# Patient Record
Sex: Male | Born: 1960 | Race: White | Hispanic: No | Smoking: Former smoker
Health system: Southern US, Community
[De-identification: ages and names within clinical notes are randomized; demographics above are authoritative.]

## PROBLEM LIST (undated history)

## (undated) DIAGNOSIS — F419 Anxiety disorder, unspecified: Secondary | ICD-10-CM

## (undated) DIAGNOSIS — I10 Essential (primary) hypertension: Secondary | ICD-10-CM

## (undated) DIAGNOSIS — E785 Hyperlipidemia, unspecified: Secondary | ICD-10-CM

## (undated) DIAGNOSIS — E119 Type 2 diabetes mellitus without complications: Secondary | ICD-10-CM

## (undated) DIAGNOSIS — J439 Emphysema, unspecified: Secondary | ICD-10-CM

## (undated) DIAGNOSIS — F32A Depression, unspecified: Secondary | ICD-10-CM

## (undated) DIAGNOSIS — J45909 Unspecified asthma, uncomplicated: Secondary | ICD-10-CM

## (undated) HISTORY — DX: Hyperlipidemia, unspecified: E78.5

## (undated) HISTORY — DX: Type 2 diabetes mellitus without complications: E11.9

## (undated) HISTORY — DX: Anxiety disorder, unspecified: F41.9

## (undated) HISTORY — DX: Unspecified asthma, uncomplicated: J45.909

## (undated) HISTORY — DX: Emphysema, unspecified: J43.9

## (undated) HISTORY — DX: Essential (primary) hypertension: I10

## (undated) HISTORY — DX: Depression, unspecified: F32.A

## (undated) HISTORY — PX: HERNIA REPAIR: SHX51

---

## 2001-02-13 ENCOUNTER — Emergency Department (HOSPITAL_COMMUNITY): Admission: EM | Admit: 2001-02-13 | Discharge: 2001-02-13 | Payer: Self-pay | Admitting: Emergency Medicine

## 2001-02-13 ENCOUNTER — Encounter: Payer: Self-pay | Admitting: Emergency Medicine

## 2016-01-27 LAB — HM COLONOSCOPY

## 2019-06-18 ENCOUNTER — Emergency Department
Admission: EM | Admit: 2019-06-18 | Discharge: 2019-06-18 | Disposition: A | Discharge status: Home / Self Care | Payer: 59 | Attending: Emergency Medicine | Admitting: Emergency Medicine

## 2019-06-18 ENCOUNTER — Encounter: Payer: Self-pay | Admitting: *Deleted

## 2019-06-18 ENCOUNTER — Emergency Department: Payer: 59

## 2019-06-18 ENCOUNTER — Other Ambulatory Visit: Payer: Self-pay

## 2019-06-18 DIAGNOSIS — F17210 Nicotine dependence, cigarettes, uncomplicated: Secondary | ICD-10-CM | POA: Insufficient documentation

## 2019-06-18 DIAGNOSIS — K529 Noninfective gastroenteritis and colitis, unspecified: Secondary | ICD-10-CM

## 2019-06-18 DIAGNOSIS — R1010 Upper abdominal pain, unspecified: Secondary | ICD-10-CM | POA: Diagnosis present

## 2019-06-18 LAB — COMPREHENSIVE METABOLIC PANEL
ALT: 68 U/L — ABNORMAL HIGH (ref 0–44)
AST: 55 U/L — ABNORMAL HIGH (ref 15–41)
Albumin: 4.6 g/dL (ref 3.5–5.0)
Alkaline Phosphatase: 80 U/L (ref 38–126)
Anion gap: 10 (ref 5–15)
BUN: 15 mg/dL (ref 6–20)
CO2: 29 mmol/L (ref 22–32)
Calcium: 9.5 mg/dL (ref 8.9–10.3)
Chloride: 98 mmol/L (ref 98–111)
Creatinine, Ser: 0.89 mg/dL (ref 0.61–1.24)
GFR calc Af Amer: >60 mL/min (ref >60)
GFR calc non Af Amer: >60 mL/min (ref >60)
Glucose, Bld: 113 mg/dL — ABNORMAL HIGH (ref 70–99)
Potassium: 4 mmol/L (ref 3.5–5.1)
Sodium: 137 mmol/L (ref 135–145)
Total Bilirubin: 1.1 mg/dL (ref 0.3–1.2)
Total Protein: 8 g/dL (ref 6.5–8.1)

## 2019-06-18 LAB — CBC
HCT: 48.7 % (ref 39.0–52.0)
Hemoglobin: 16.3 g/dL (ref 13.0–17.0)
MCH: 30.4 pg (ref 26.0–34.0)
MCHC: 33.5 g/dL (ref 30.0–36.0)
MCV: 90.9 fL (ref 80.0–100.0)
Platelets: 242 10*3/uL (ref 150–400)
RBC: 5.36 MIL/uL (ref 4.22–5.81)
RDW: 12.9 % (ref 11.5–15.5)
WBC: 13.8 10*3/uL — ABNORMAL HIGH (ref 4.0–10.5)
nRBC: 0 % (ref 0.0–0.2)

## 2019-06-18 LAB — URINALYSIS, COMPLETE (UACMP) WITH MICROSCOPIC
Bacteria, UA: NONE SEEN
Bilirubin Urine: NEGATIVE
Glucose, UA: NEGATIVE mg/dL
Hgb urine dipstick: NEGATIVE
Ketones, ur: 5 mg/dL — AB
Leukocytes,Ua: NEGATIVE
Nitrite: NEGATIVE
Protein, ur: NEGATIVE mg/dL
Specific Gravity, Urine: 1.019 (ref 1.005–1.030)
pH: 5 (ref 5.0–8.0)

## 2019-06-18 LAB — LIPASE, BLOOD: Lipase: 26 U/L (ref 11–51)

## 2019-06-18 MED ORDER — AMOXICILLIN-POT CLAVULANATE 875-125 MG PO TABS: 1.0000 | ORAL_TABLET | Freq: Two times a day (BID) | ORAL | 0 refills | Status: AC

## 2019-06-18 MED ORDER — IOHEXOL 9 MG/ML PO SOLN
500.0000 mL | ORAL | Status: DC | PRN
  Administered 2019-06-18: 500 mL via ORAL
  Filled 2019-06-18 (×2): qty 500

## 2019-06-18 MED ORDER — IOHEXOL 300 MG/ML  SOLN
100.0000 mL | Freq: Once | INTRAMUSCULAR | Status: AC | PRN
  Administered 2019-06-18: 100 mL via INTRAVENOUS
  Filled 2019-06-18: qty 100

## 2019-06-18 MED ORDER — IOHEXOL 9 MG/ML PO SOLN
500.0000 mL | ORAL | Status: AC
  Filled 2019-06-18 (×2): qty 500

## 2019-06-18 MED ORDER — AMOXICILLIN-POT CLAVULANATE 875-125 MG PO TABS
1.0000 | ORAL_TABLET | Freq: Once | ORAL | Status: AC
  Administered 2019-06-18: 1 via ORAL
  Filled 2019-06-18 (×2): qty 1

## 2019-06-18 NOTE — ED Provider Notes (Signed)
[facility] Emergency Department Provider Note  Time seen: 5:45 PM  I have reviewed the triage vital signs and the nursing notes.   HISTORY  Chief Complaint Abdominal Pain and Diarrhea   HPI Roy Richards is a 58 y.o. male with no past medical history who presents to the emergency department for abdominal pain.  According to the patient 3 days ago he developed abdominal pain across his entire upper abdomen.  States some nausea as well as some loose stool.  States abdominal pain is been intermittent but seem to worsen throughout the weekend.  States the pain is now in a bandlike fashion across the entire abdomen radiating into his back.  Denies any worsening with food.  Patient states very rare alcohol use.  Denies any fever cough or shortness of breath.   History reviewed. No pertinent past medical history.  There are no active problems to display for this patient.   History reviewed. No pertinent surgical history.  Prior to Admission medications   Not on File    Not on File  History reviewed. No pertinent family history.  Social History Social History   Tobacco Use  . Smoking status: Current Some Day Smoker    Types: Cigarettes  . Smokeless tobacco: Never Used  Substance Use Topics  . Alcohol use: Yes    Comment: occasionally  . Drug use: Not on file    Review of Systems Constitutional: Negative for fever. Cardiovascular: Negative for chest pain. Respiratory: Negative for shortness of breath. Gastrointestinal: Moderate dull abdominal pain in a band across the abdomen.  Negative for vomiting.  Positive for loose stool. Genitourinary: Negative for hematuria or dysuria. Musculoskeletal: Negative for musculoskeletal complaints Skin: Negative for skin complaints  Neurological: Negative for headache All other ROS negative  ____________________________________________   PHYSICAL EXAM:  VITAL SIGNS: ED Triage Vitals  Enc Vitals Group     BP  06/18/19 1618 102/70     Pulse Rate 06/18/19 1618 77     Resp 06/18/19 1618 16     Temp 06/18/19 1618 98.8 F (37.1 C)     Temp Source 06/18/19 1618 Oral     SpO2 06/18/19 1618 93 %     Weight 06/18/19 1619 164 lb (74.4 kg)     Height 06/18/19 1619 5\' 2"  (1.575 m)     Head Circumference --      Peak Flow --      Pain Score --      Pain Loc --      Pain Edu? --      Excl. in [place]? --    Constitutional: Alert and oriented. Well appearing and in no distress. Eyes: Normal exam ENT      Head: Normocephalic and atraumatic.      Mouth/Throat: Mucous membranes are moist. Cardiovascular: Normal rate, regular rhythm.  Respiratory: Normal respiratory effort without tachypnea nor retractions. Breath sounds are clear  Gastrointestinal: Soft, moderate fairly diffuse abdominal tenderness to palpation without focal tenderness identified.  No rebound guarding.  Minimal distention. Musculoskeletal: Nontender with normal range of motion in all extremities. Neurologic:  Normal speech and language. No gross focal neurologic deficits Skin:  Skin is warm, dry and intact.  Psychiatric: Mood and affect are normal.   ____________________________________________   INITIAL IMPRESSION / ASSESSMENT AND PLAN / ED COURSE  Pertinent labs & imaging results that were available during my care of the patient were reviewed by me and considered in my medical decision making (see  chart for details).   Patient presents to the emergency department for abdominal pain.  Differential would include pancreatitis, cholecystitis, colitis or diverticulitis, gastritis or gastroenteritis.  Patient's labs show mild leukocytosis 13,000, mild LFT elevation however on exam patient has no more tender in the right upper quadrant than anywhere else in the abdomen.  Given these findings we will proceed with CT imaging of the abdomen and pelvis.  Patient agreeable to plan of care.  CT scan has resulted showing possible mild ascending  colitis.  Overall patient appears well and describes his pain is a 2 or 3 out of 10.  We will discharge with Augmentin.  Patient is to follow-up with his doctor.  [name] was evaluated in Emergency Department on 06/18/2019 for the symptoms described in the history of present illness. He was evaluated in the context of the global COVID-19 pandemic, which necessitated consideration that the patient might be at risk for infection with the SARS-CoV-2 virus that causes COVID-19. Institutional protocols and algorithms that pertain to the evaluation of patients at risk for COVID-19 are in a state of rapid change based on information released by regulatory bodies including the CDC and federal and state organizations. These policies and algorithms were followed during the patient's care in the ED.  ____________________________________________   FINAL CLINICAL IMPRESSION(S) / ED DIAGNOSES  Colitis   Minna Antis, MD 06/18/19 2006

## 2019-06-18 NOTE — ED Triage Notes (Signed)
Pt to ED reporting 3-4 days of abd pain with diarrhea starting Friday. Stool is green in color and pt reporting concern for increased abd pain and new back pain starting today. No nausea or vomiting and no fevers.

## 2019-06-25 ENCOUNTER — Other Ambulatory Visit: Payer: Self-pay

## 2019-06-26 ENCOUNTER — Ambulatory Visit: Payer: Self-pay | Admitting: Internal Medicine

## 2019-06-26 ENCOUNTER — Encounter: Payer: Self-pay | Admitting: Primary Care

## 2019-06-26 ENCOUNTER — Ambulatory Visit (INDEPENDENT_AMBULATORY_CARE_PROVIDER_SITE_OTHER): Payer: 59 | Admitting: Primary Care

## 2019-06-26 VITALS — BP 90/60 | HR 80 | Temp 97.9°F | Ht 62.0 in | Wt 167.0 lb

## 2019-06-26 DIAGNOSIS — I700 Atherosclerosis of aorta: Secondary | ICD-10-CM | POA: Diagnosis not present

## 2019-06-26 DIAGNOSIS — E785 Hyperlipidemia, unspecified: Secondary | ICD-10-CM

## 2019-06-26 DIAGNOSIS — I10 Essential (primary) hypertension: Secondary | ICD-10-CM | POA: Diagnosis not present

## 2019-06-26 DIAGNOSIS — J449 Chronic obstructive pulmonary disease, unspecified: Secondary | ICD-10-CM | POA: Diagnosis not present

## 2019-06-26 DIAGNOSIS — E119 Type 2 diabetes mellitus without complications: Secondary | ICD-10-CM

## 2019-06-26 DIAGNOSIS — K529 Noninfective gastroenteritis and colitis, unspecified: Secondary | ICD-10-CM | POA: Diagnosis not present

## 2019-06-26 HISTORY — DX: Noninfective gastroenteritis and colitis, unspecified: K52.9

## 2019-06-26 LAB — CBC WITH DIFFERENTIAL/PLATELET
Basophils Absolute: 0.1 10*3/uL (ref 0.0–0.1)
Basophils Relative: 0.6 % (ref 0.0–3.0)
Eosinophils Absolute: 0.2 10*3/uL (ref 0.0–0.7)
Eosinophils Relative: 1.7 % (ref 0.0–5.0)
HCT: 47.9 % (ref 39.0–52.0)
Hemoglobin: 15.9 g/dL (ref 13.0–17.0)
Lymphocytes Relative: 27.7 % (ref 12.0–46.0)
Lymphs Abs: 2.5 10*3/uL (ref 0.7–4.0)
MCHC: 33.1 g/dL (ref 30.0–36.0)
MCV: 92.1 fl (ref 78.0–100.0)
Monocytes Absolute: 0.9 10*3/uL (ref 0.1–1.0)
Monocytes Relative: 9.6 % (ref 3.0–12.0)
Neutro Abs: 5.4 10*3/uL (ref 1.4–7.7)
Neutrophils Relative %: 60.4 % (ref 43.0–77.0)
Platelets: 249 10*3/uL (ref 150.0–400.0)
RBC: 5.2 Mil/uL (ref 4.22–5.81)
RDW: 13.6 % (ref 11.5–15.5)
WBC: 9 10*3/uL (ref 4.0–10.5)

## 2019-06-26 LAB — LIPID PANEL
Cholesterol: 202 mg/dL — ABNORMAL HIGH (ref 0–200)
HDL: 42.8 mg/dL (ref >39.00)
LDL Cholesterol: 130 mg/dL — ABNORMAL HIGH (ref 0–99)
NonHDL: 159.32
Total CHOL/HDL Ratio: 5
Triglycerides: 148 mg/dL (ref 0.0–149.0)
VLDL: 29.6 mg/dL (ref 0.0–40.0)

## 2019-06-26 LAB — COMPREHENSIVE METABOLIC PANEL
ALT: 51 U/L (ref 0–53)
AST: 29 U/L (ref 0–37)
Albumin: 4.5 g/dL (ref 3.5–5.2)
Alkaline Phosphatase: 78 U/L (ref 39–117)
BUN: 11 mg/dL (ref 6–23)
CO2: 32 mEq/L (ref 19–32)
Calcium: 9.6 mg/dL (ref 8.4–10.5)
Chloride: 101 mEq/L (ref 96–112)
Creatinine, Ser: 0.87 mg/dL (ref 0.40–1.50)
GFR: 89.5 mL/min (ref >60.00)
Glucose, Bld: 95 mg/dL (ref 70–99)
Potassium: 4.4 mEq/L (ref 3.5–5.1)
Sodium: 139 mEq/L (ref 135–145)
Total Bilirubin: 0.4 mg/dL (ref 0.2–1.2)
Total Protein: 6.9 g/dL (ref 6.0–8.3)

## 2019-06-26 LAB — HEMOGLOBIN A1C: Hgb A1c MFr Bld: 7.1 % — ABNORMAL HIGH (ref 4.6–6.5)

## 2019-06-26 NOTE — Assessment & Plan Note (Signed)
Tobacco abuse for 40+ years, commended him on quitting five days ago.  Discussed to use Symbicort BID as prescribed. Stop using albuterol daily, only use albuterol if needed.

## 2019-06-26 NOTE — Progress Notes (Signed)
Subjective:    Patient ID: Roy Richards, male    DOB: 30-Jul-1961, 58 y.o.   MRN: [id number]  HPI  Roy Richards is a 58 year old male who presents today to establish care and discuss the problems mentioned below. Will obtain/review records.  1) Essential Hypertension: Currently managed on lisinopril 20 mg which was recently increased from 10 mg several weeks ago by his prior PCP. He took his first dose of lisinopril 20 mg today. Also managed on Amlodipine 10 mg daily.   His BP on his right arm is 140/72, left arm is 90/60. He mostly checks his BP on his right arm. He denies dizziness, chest pain, visual changes.  BP Readings from Last 3 Encounters:  06/26/19 90/60  06/18/19 110/85    2) COPD: Currently managed on Symbicort 160 mcg-4.5 mcg inhaler and albuterol inhaler. He is compliant to his Symbicort inhaler 2 puffs twice daily as prescribed. He is using his albuterol inhaler daily more out of habit rather than actual need. History of tobacco abuse for the last 40 years, has been cutting back for the last 6 months, quit 5 days ago.   3) Abdominal Pain: He presented to [facility] ED on 06/18/19 with a chief complaint of abdominal pain and diarrhea that began three days prior. Pain was located to the entire upper abdomen with nausea and loose stools.   During his stay in the ED he underwent labs which showed mild leukocytosis, mild elevation in LFT's. He underwent CT abdomen/pelvis which showed possible mild ascending colitis. Aortic atherosclerosis noted as incidental finding. Given his mild tenderness and presentation he was provided with a prescription for Augmentin antibiotics and discharged home for outpatient follow up.  Since his visit to the ED he's been compliant to his oral antibiotics, has two days left. He has noticed slight improvement in symptoms with reduction in his cramping but continues to feel "queazy" with abdominal discomfort, nausea. He denies vomiting, constipation, diarrhea.    He also reports chronic sharp spasms to the bilateral upper abdomen for years, has mentioned this to prior PCP.    Review of Systems  Constitutional: Negative for fever.  Eyes: Negative for visual disturbance.  Respiratory: Negative for shortness of breath.   Cardiovascular: Negative for chest pain.  Gastrointestinal: Positive for abdominal pain and nausea. Negative for blood in stool, constipation, diarrhea and vomiting.  Neurological: Negative for dizziness and headaches.       No past medical history on file.   Social History   Socioeconomic History  . Marital status: Single    Spouse name: Not on file  . Number of children: Not on file  . Years of education: Not on file  . Highest education level: Not on file  Occupational History  . Not on file  Social Needs  . Financial resource strain: Not on file  . Food insecurity    Worry: Not on file    Inability: Not on file  . Transportation needs    Medical: Not on file    Non-medical: Not on file  Tobacco Use  . Smoking status: Current Some Day Smoker    Types: Cigarettes  . Smokeless tobacco: Never Used  Substance and Sexual Activity  . Alcohol use: Yes    Comment: occasionally  . Drug use: Not on file  . Sexual activity: Yes  Lifestyle  . Physical activity    Days per week: Not on file    Minutes per session: Not on file  .  Stress: Not on file  Relationships  . Social Musician on phone: Not on file    Gets together: Not on file    Attends religious service: Not on file    Active member of club or organization: Not on file    Attends meetings of clubs or organizations: Not on file    Relationship status: Not on file  . Intimate partner violence    Fear of current or ex partner: Not on file    Emotionally abused: Not on file    Physically abused: Not on file    Forced sexual activity: Not on file  Other Topics Concern  . Not on file  Social History Narrative  . Not on file    No past  surgical history on file.  No family history on file.  No Known Allergies  Current Outpatient Medications on File Prior to Visit  Medication Sig Dispense Refill  . albuterol (VENTOLIN HFA) 108 (90 Base) MCG/ACT inhaler Inhale 1-2 puffs into the lungs every 6 (six) hours as needed.     Marland Kitchen amLODipine (NORVASC) 10 MG tablet Take 10 mg by mouth daily.     Marland Kitchen amoxicillin-clavulanate (AUGMENTIN) 875-125 MG tablet Take 1 tablet by mouth 2 (two) times daily for 10 days. 20 tablet 0  . aspirin EC 81 MG tablet Take 81 mg by mouth daily.    Marland Kitchen lisinopril (ZESTRIL) 20 MG tablet Take 20 mg by mouth daily.     . Multiple Vitamins-Minerals (MULTIVITAMIN ADULT PO) Take by mouth.    . SYMBICORT 160-4.5 MCG/ACT inhaler Inhale 2 puffs into the lungs 2 (two) times daily.      No current facility-administered medications on file prior to visit.     BP 90/60 (BP Location: Left Arm, Patient Position: Sitting, Cuff Size: Normal)   Pulse 80   Temp 97.9 F (36.6 C) (Temporal)   Ht 5\' 2"  (1.575 m)   Wt 167 lb (75.8 kg)   SpO2 95%   BMI 30.54 kg/m    Objective:   Physical Exam  Constitutional: He appears well-nourished.  Neck: Neck supple.  Cardiovascular: Normal rate and regular rhythm.  Respiratory: Effort normal and breath sounds normal.  GI: Soft. Normal appearance and bowel sounds are normal. There is abdominal tenderness in the epigastric area and periumbilical area.    Skin: Skin is warm and dry.  Psychiatric: He has a normal mood and affect.           Assessment & Plan:

## 2019-06-26 NOTE — Patient Instructions (Addendum)
Stop by the lab prior to leaving today. I will notify you of your results once received.   Continue taking the amoxicillin-clavulanate antibiotics until gone.  Eat a bland diet, avoid spicy/greasy/fried food.   Only use the albuterol inhaler if you need it. Use the Symbicort inhaler twice daily as prescribed.  Start monitoring your blood pressure daily, around the same time of day, for the next 2-3 weeks.  Ensure that you have rested for 30 minutes prior to checking your blood pressure. Record your readings and send me some readings through your My Chart portal or call our office.  It was a pleasure to meet you today! Please don't hesitate to call or message me with any questions. Welcome to [facility]!

## 2019-06-26 NOTE — Assessment & Plan Note (Signed)
Incidental finding on CT abdomen/pelvis from recent ED visit.  Commended him on tobacco abuse. Lipid panel pending today. Will start statin therapy once lipids return.

## 2019-06-26 NOTE — Assessment & Plan Note (Signed)
Diagnosed recently during ED visit. All notes/labs/imaging reviewed.  Continue Augmentin as prescribed. Exam today stable but with residual symptoms.  Discussed monitoring symptoms as he completes antibiotics. We will see him back in one week for follow up.  Repeat CMP and CBC pending.

## 2019-06-26 NOTE — Assessment & Plan Note (Signed)
Chronic. Recent increase in lisinopril to 20 mg, also compliant to amlodipine.  Discussed for him to monitor BP at home, using the same arm, same time of day when resting. He will monitor and return in one week with BP logs.  CMP pending.

## 2019-06-27 MED ORDER — ATORVASTATIN CALCIUM 40 MG PO TABS: 40.0000 mg | ORAL_TABLET | Freq: Every evening | ORAL | 1 refills | Status: DC

## 2019-06-27 MED ORDER — METFORMIN HCL 500 MG PO TABS: 500.0000 mg | ORAL_TABLET | Freq: Two times a day (BID) | ORAL | 1 refills | Status: DC

## 2019-07-03 ENCOUNTER — Ambulatory Visit (INDEPENDENT_AMBULATORY_CARE_PROVIDER_SITE_OTHER): Payer: 59

## 2019-07-03 ENCOUNTER — Encounter: Payer: Self-pay | Admitting: Primary Care

## 2019-07-03 ENCOUNTER — Ambulatory Visit: Payer: 59 | Admitting: Primary Care

## 2019-07-03 ENCOUNTER — Other Ambulatory Visit: Payer: Self-pay | Admitting: Primary Care

## 2019-07-03 ENCOUNTER — Other Ambulatory Visit: Payer: Self-pay

## 2019-07-03 VITALS — BP 154/90 | HR 75 | Temp 98.0°F | Ht 62.0 in | Wt 164.2 lb

## 2019-07-03 DIAGNOSIS — I998 Other disorder of circulatory system: Secondary | ICD-10-CM

## 2019-07-03 DIAGNOSIS — I10 Essential (primary) hypertension: Secondary | ICD-10-CM | POA: Diagnosis not present

## 2019-07-03 DIAGNOSIS — I700 Atherosclerosis of aorta: Secondary | ICD-10-CM

## 2019-07-03 NOTE — Assessment & Plan Note (Signed)
Agreeable to statin therapy for which he will start today.

## 2019-07-03 NOTE — Progress Notes (Signed)
Subjective:    Patient ID: Roy Richards, male    DOB: 12-19-1960, 58 y.o.   MRN: [id number]  HPI  Roy Richards is a 58 year old male who presents today for follow up of hypertension.  He was last evaluated as a new patient one week ago. He endorsed wide ranging blood pressure readings on each arm for quite some time. Prior to his visit with [place] his prior PCP increased his lisinopril to 20 mg.   Since his last visit he is compliant to both amlodipine 10 mg and lisinopril 20 mg.   Today: Left arm: 100/60 Right arm: 154/90  BP Readings from Last 3 Encounters:  07/03/19 (!) 154/90  06/26/19 90/60  06/18/19 110/85   After his visit his labs returned with a A1C of 7.1 and lipid panel of 130, we notified him of these results via my chart. He did return our message and agreed to Metformin and Lipitor for treatment.   He has not yet taken his Metformin and atorvastatin .   He denies dizziness, chest pain, shortness of breath. He's been checking his BP on his right arm which is running 120's-150's/80's90's.   Review of Systems  Eyes: Negative for visual disturbance.  Respiratory: Negative for shortness of breath.   Cardiovascular: Negative for chest pain and leg swelling.  Neurological: Negative for dizziness.       No past medical history on file.   Social History   Socioeconomic History  . Marital status: Single    Spouse name: Not on file  . Number of children: Not on file  . Years of education: Not on file  . Highest education level: Not on file  Occupational History  . Not on file  Social Needs  . Financial resource strain: Not on file  . Food insecurity    Worry: Not on file    Inability: Not on file  . Transportation needs    Medical: Not on file    Non-medical: Not on file  Tobacco Use  . Smoking status: Current Some Day Smoker    Types: Cigarettes  . Smokeless tobacco: Never Used  Substance and Sexual Activity  . Alcohol use: Yes    Comment: occasionally   . Drug use: Not on file  . Sexual activity: Yes  Lifestyle  . Physical activity    Days per week: Not on file    Minutes per session: Not on file  . Stress: Not on file  Relationships  . Social Musician on phone: Not on file    Gets together: Not on file    Attends religious service: Not on file    Active member of club or organization: Not on file    Attends meetings of clubs or organizations: Not on file    Relationship status: Not on file  . Intimate partner violence    Fear of current or ex partner: Not on file    Emotionally abused: Not on file    Physically abused: Not on file    Forced sexual activity: Not on file  Other Topics Concern  . Not on file  Social History Narrative  . Not on file    No past surgical history on file.  No family history on file.  No Known Allergies  Current Outpatient Medications on File Prior to Visit  Medication Sig Dispense Refill  . albuterol (VENTOLIN HFA) 108 (90 Base) MCG/ACT inhaler Inhale 1-2 puffs into the  lungs every 6 (six) hours as needed.     Marland Kitchen amLODipine (NORVASC) 10 MG tablet Take 10 mg by mouth daily.     Marland Kitchen aspirin EC 81 MG tablet Take 81 mg by mouth daily.    Marland Kitchen atorvastatin (LIPITOR) 40 MG tablet Take 1 tablet (40 mg total) by mouth every evening. For cholesterol. 90 tablet 1  . lisinopril (ZESTRIL) 20 MG tablet Take 20 mg by mouth daily.     . metFORMIN (GLUCOPHAGE) 500 MG tablet Take 1 tablet (500 mg total) by mouth 2 (two) times daily with a meal. For diabetes. 180 tablet 1  . Multiple Vitamins-Minerals (MULTIVITAMIN ADULT PO) Take by mouth.    . SYMBICORT 160-4.5 MCG/ACT inhaler Inhale 2 puffs into the lungs 2 (two) times daily.      No current facility-administered medications on file prior to visit.     BP (!) 154/90 (BP Location: Right Arm, Patient Position: Sitting, Cuff Size: Normal)   Pulse 75   Temp 98 F (36.7 C) (Temporal)   Ht 5\' 2"  (1.575 m)   Wt 164 lb 4 oz (74.5 kg)   SpO2 94%   BMI  30.04 kg/m    Objective:   Physical Exam  Constitutional: He appears well-nourished.  Neck: Neck supple. Carotid bruit is not present.  No obvious carotid bruit but difficult to auscultate, especially on left side.  Cardiovascular: Normal rate and regular rhythm.  Respiratory: Effort normal and breath sounds normal.  Skin: Skin is warm and dry.           Assessment & Plan:

## 2019-07-03 NOTE — Addendum Note (Signed)
Addended by: Pleas Koch on: 07/03/2019 08:34 AM   Modules accepted: Orders

## 2019-07-03 NOTE — Patient Instructions (Signed)
Continue taking your blood pressure medications. Continue to monitor your blood pressure once daily as discussed.  Stop by the front desk and speak with either Rosaria Ferries or Charmaine regarding your carotid ultrasound.  Start Metformin tablets for diabetes as discussed. Start atorvastatin tablets for cholesterol as discussed.  Please schedule a follow up appointment in 3 months for diabetes check.  It was a pleasure to see you today!

## 2019-07-03 NOTE — Assessment & Plan Note (Signed)
Office readings are hardly improved on right arm. There is still a large fluctuation compared to left arm.   Continue lisinopril and amlodipine as prescribed. Stat carotid ultrasound ordered for evidence of blockages.  Encouraged to start statin therapy.  Await results.

## 2019-07-04 ENCOUNTER — Other Ambulatory Visit: Payer: Self-pay | Admitting: Primary Care

## 2019-07-04 DIAGNOSIS — I6523 Occlusion and stenosis of bilateral carotid arteries: Secondary | ICD-10-CM

## 2019-07-04 DIAGNOSIS — I10 Essential (primary) hypertension: Secondary | ICD-10-CM

## 2019-07-04 DIAGNOSIS — I700 Atherosclerosis of aorta: Secondary | ICD-10-CM

## 2019-07-06 ENCOUNTER — Encounter: Payer: Self-pay | Admitting: Cardiology

## 2019-07-06 ENCOUNTER — Other Ambulatory Visit: Payer: Self-pay

## 2019-07-06 ENCOUNTER — Ambulatory Visit (INDEPENDENT_AMBULATORY_CARE_PROVIDER_SITE_OTHER): Payer: 59 | Admitting: Cardiology

## 2019-07-06 VITALS — BP 146/82 | HR 68 | Ht 62.0 in | Wt 165.0 lb

## 2019-07-06 DIAGNOSIS — I10 Essential (primary) hypertension: Secondary | ICD-10-CM

## 2019-07-06 DIAGNOSIS — I6529 Occlusion and stenosis of unspecified carotid artery: Secondary | ICD-10-CM

## 2019-07-06 DIAGNOSIS — I771 Stricture of artery: Secondary | ICD-10-CM

## 2019-07-06 DIAGNOSIS — F17200 Nicotine dependence, unspecified, uncomplicated: Secondary | ICD-10-CM

## 2019-07-06 NOTE — Patient Instructions (Signed)
Medication Instructions:  Your physician recommends that you continue on your current medications as directed. Please refer to the Current Medication list given to you today.  *If you need a refill on your cardiac medications before your next appointment, please call your pharmacy*  Lab Work: None ordered If you have labs (blood work) drawn today and your tests are completely normal, you will receive your results only by: Marland Kitchen MyChart Message (if you have MyChart) OR . A paper copy in the mail If you have any lab test that is abnormal or we need to change your treatment, we will call you to review the results.  Testing/Procedures: Non-Cardiac CT Angiography (CTA) of the neck, is a special type of CT scan that uses a computer to produce multi-dimensional views of major blood vessels throughout the body. In CT angiography, a contrast material is injected through an IV to help visualize the blood vessels.  Please call [phone] to schedule    Follow-Up: At [facility], you and your health needs are our priority.  As part of our continuing mission to provide you with exceptional heart care, we have created designated Provider Care Teams.  These Care Teams include your primary Cardiologist (physician) and Advanced Practice Providers (APPs -  Physician Assistants and Nurse Practitioners) who all work together to provide you with the care you need, when you need it.  Your next appointment:   3 months  The format for your next appointment:   In Person  Provider:    You may see Kate Sable, MD or one of the following Advanced Practice Providers on your designated Care Team:    Murray Hodgkins, NP  Christell Faith, PA-C  Marrianne Mood, PA-C   Other Instructions You have been referred to [facility] they will contact you directly to schedule an appointment.

## 2019-07-06 NOTE — Progress Notes (Signed)
Cardiology Office Note:    Date:  07/06/2019   ID:  Roy Richards, DOB 1961/06/17, MRN [id number]  PCP:  Doreene Nestlark, Katherine K, NP  Cardiologist:  Debbe OdeaBrian Agbor-Etang, MD  Electrophysiologist:  None   Referring MD: Doreene Nestlark, Katherine K, NP   Chief Complaint  Patient presents with  . New Patient (Initial Visit)    Referred by PCP. Patient c/o cramping chest pain and swelling. Meds reviewed verbally with patient.     History of Present Illness:    Roy Richards is a 58 y.o. male with a hx of hypertension, hyperlipidemia, diabetes, COPD, current smoker for over 35 years who presents due to difference in blood pressure in his arms.  Patient's primary care provider couple of days ago and blood pressure measurements in his arms were discrepant, left arm 100/60, right arm 154/90.  He states doing okay, denies chest pain, dizziness, presyncope or syncope.  He has chronic shortness of breath due to COPD.  He states having problems finding his pulse on his left arm.    He underwent a bilateral carotid artery ultrasound which showed right internal carotid artery 40 to 59% stenosis, right external carotid artery over 50% stenosed.  Left internal carotid artery was 1 to 39% stenosed, the left external carotid artery was less than 50% stenosed.  In the office today, left arm blood pressure was 88/70, right arm blood pressure is 146/82.  Past Medical History:  Diagnosis Date  . Diabetes mellitus without complication (HCC)   . Hyperlipidemia   . Hypertension     History reviewed. No pertinent surgical history.  Current Medications: Current Meds  Medication Sig  . albuterol (VENTOLIN HFA) 108 (90 Base) MCG/ACT inhaler Inhale 1-2 puffs into the lungs every 6 (six) hours as needed.   Marland Kitchen. amLODipine (NORVASC) 10 MG tablet Take 10 mg by mouth daily.   Marland Kitchen. aspirin EC 81 MG tablet Take 81 mg by mouth daily.  Marland Kitchen. atorvastatin (LIPITOR) 40 MG tablet Take 1 tablet (40 mg total) by mouth every evening. For  cholesterol.  Marland Kitchen. lisinopril (ZESTRIL) 20 MG tablet Take 20 mg by mouth daily.   . metFORMIN (GLUCOPHAGE) 500 MG tablet Take 1 tablet (500 mg total) by mouth 2 (two) times daily with a meal. For diabetes.  . Multiple Vitamins-Minerals (MULTIVITAMIN ADULT PO) Take by mouth.  . SYMBICORT 160-4.5 MCG/ACT inhaler Inhale 2 puffs into the lungs 2 (two) times daily.      Allergies:   Patient has no known allergies.   Social History   Socioeconomic History  . Marital status: Single    Spouse name: Not on file  . Number of children: Not on file  . Years of education: Not on file  . Highest education level: Not on file  Occupational History  . Not on file  Social Needs  . Financial resource strain: Not on file  . Food insecurity    Worry: Not on file    Inability: Not on file  . Transportation needs    Medical: Not on file    Non-medical: Not on file  Tobacco Use  . Smoking status: Current Some Day Smoker    Types: Cigarettes  . Smokeless tobacco: Never Used  Substance and Sexual Activity  . Alcohol use: Yes    Comment: occasionally  . Drug use: Not on file  . Sexual activity: Yes  Lifestyle  . Physical activity    Days per week: Not on file    Minutes  per session: Not on file  . Stress: Not on file  Relationships  . Social Herbalist on phone: Not on file    Gets together: Not on file    Attends religious service: Not on file    Active member of club or organization: Not on file    Attends meetings of clubs or organizations: Not on file    Relationship status: Not on file  Other Topics Concern  . Not on file  Social History Narrative  . Not on file     Family History: Denies any family history of heart disease.  ROS:   Please see the history of present illness.     All other systems reviewed and are negative.  EKGs/Labs/Other Studies Reviewed:    The following studies were reviewed today: Carotid duplex bilateral 07/03/2019 Summary: Right Carotid:  Velocities in the right ICA are consistent with a 40-59%                stenosis. The ECA appears >50% stenosed.  Left Carotid: Velocities in the left ICA are consistent with a 1-39% stenosis.               The ECA appears <50% stenosed. Findings appear to be consistant               with a left subclavian steal.   EKG:  EKG is  ordered today.  The ekg ordered today demonstrates sinus rhythm, possible old septal infarct.  Recent Labs: 06/26/2019: ALT 51; BUN 11; Creatinine, Ser 0.87; Hemoglobin 15.9; Platelets 249.0; Potassium 4.4; Sodium 139  Recent Lipid Panel    Component Value Date/Time   CHOL 202 (H) 06/26/2019 1126   TRIG 148.0 06/26/2019 1126   HDL 42.80 06/26/2019 1126   CHOLHDL 5 06/26/2019 1126   VLDL 29.6 06/26/2019 1126   LDLCALC 130 (H) 06/26/2019 1126    Physical Exam:    VS:  BP (!) 146/82 (BP Location: Right Arm, Patient Position: Sitting, Cuff Size: Normal)   Pulse 68   Ht 5\' 2"  (1.575 m)   Wt 165 lb (74.8 kg)   BMI 30.18 kg/m     Wt Readings from Last 3 Encounters:  07/06/19 165 lb (74.8 kg)  07/03/19 164 lb 4 oz (74.5 kg)  06/26/19 167 lb (75.8 kg)     GEN:  Well nourished, well developed in no acute distress HEENT: Normal NECK: No JVD; No carotid bruits LYMPHATICS: No lymphadenopathy CARDIAC: RRR, no murmurs, rubs, gallops RESPIRATORY:  Clear to auscultation without rales, wheezing or rhonchi, poor inspiratory effort. ABDOMEN: Soft, non-tender, non-distended MUSCULOSKELETAL:  No edema; No deformity  SKIN: Warm and dry NEUROLOGIC:  Alert and oriented x 3 PSYCHIATRIC:  Normal affect   ASSESSMENT:   Patient with carotid artery stenosis, and likely subclavian artery stenosis as per carotid ultrasound, with discrepant blood pressures between the right and left arms.  1. Stenosis of carotid artery, unspecified laterality   2. Subclavian artery stenosis, left (HCC)   3. Essential hypertension   4. Smoking    PLAN:      1. Head CT angio of  the neck to include carotid and subclavian arteries.  Continue aspirin 81 mg daily and Lipitor 40 mg daily.  Will make referral for patient to see vascular surgery.  Continue current blood pressure medications.  Smoking cessation advised over 50 minutes spent counseling patient.   Total encounter time more than 45 minutes  Greater than 50% was spent  in counseling and coordination of care with the patient   Medication Adjustments/Labs and Tests Ordered: Current medicines are reviewed at length with the patient today.  Concerns regarding medicines are outlined above.  Orders Placed This Encounter  Procedures  . CT ANGIO NECK W OR WO CONTRAST  . Ambulatory referral to Vascular Surgery  . EKG 12-Lead   No orders of the defined types were placed in this encounter.   Patient Instructions  Medication Instructions:  Your physician recommends that you continue on your current medications as directed. Please refer to the Current Medication list given to you today.  *If you need a refill on your cardiac medications before your next appointment, please call your pharmacy*  Lab Work: None ordered If you have labs (blood work) drawn today and your tests are completely normal, you will receive your results only by: Marland Kitchen MyChart Message (if you have MyChart) OR . A paper copy in the mail If you have any lab test that is abnormal or we need to change your treatment, we will call you to review the results.  Testing/Procedures: Non-Cardiac CT Angiography (CTA) of the neck, is a special type of CT scan that uses a computer to produce multi-dimensional views of major blood vessels throughout the body. In CT angiography, a contrast material is injected through an IV to help visualize the blood vessels.  Please call [phone] to schedule    Follow-Up: At [facility], you and your health needs are our priority.  As part of our continuing mission to provide you with exceptional heart care, we have  created designated Provider Care Teams.  These Care Teams include your primary Cardiologist (physician) and Advanced Practice Providers (APPs -  Physician Assistants and Nurse Practitioners) who all work together to provide you with the care you need, when you need it.  Your next appointment:   3 months  The format for your next appointment:   In Person  Provider:    You may see Debbe Odea, MD or one of the following Advanced Practice Providers on your designated Care Team:    Nicolasa Ducking, NP  Eula Listen, PA-C  Marisue Ivan, PA-C   Other Instructions You have been referred to [facility] they will contact you directly to schedule an appointment.      Signed, Debbe Odea, MD  07/06/2019 9:57 AM    [facility]

## 2019-07-16 ENCOUNTER — Telehealth: Payer: Self-pay

## 2019-07-16 NOTE — Telephone Encounter (Signed)
[employer] called to let us know opic at [facility] had not yet received the CTA order .  Spoke with Corene Cornea at imaging center and they state sometimes it takes a while for the fax to be loaded in the system so they can schedule as an order.  They advised me to let the patient know he could call in the morning to schedule.  Patient understands and is concerned that he needs this done and it has to be done tomorrow.

## 2019-07-16 NOTE — Telephone Encounter (Addendum)
Secured message received from [employer] with [facility] prior auth. The patient's insurance will not cover the CTA of the neck ordered by Dr. Garen Lah to be performed at [facility] or any [place] facility. They are wanting the patient to have the test performed at [facility]. The patient was made aware and is agreeable.  Roy Richards sts that the order for the CTA of the neck will need to be faxed to [facility] [phone].  Order faxed with the rqst for the results to be faxed back to our office attn: Dr. Garen Lah. Fax confirmation received.

## 2019-07-16 NOTE — Telephone Encounter (Signed)
It looks like these were ordered by cardiology. Please send to cardiologist.

## 2019-07-17 ENCOUNTER — Encounter: Payer: Self-pay | Admitting: Cardiology

## 2019-07-17 ENCOUNTER — Ambulatory Visit: Payer: 59

## 2019-07-17 NOTE — Telephone Encounter (Addendum)
Spoke with [facility] while the patient held the other line. [employer] had the wrong ordering provider listed as Dr. Saunders Revel. They have updated the ordering MD info to Dr. Mylo Red- Charlestine Night.  The wrong fax number for [facility] was given yesterday. The order for the patients CTA of the neck should be faxed to [phone]. Order refaxed to the fax # provided for [facility].   Second call received from [facility]. [facility] will not accept the corrected prior-auth updated with the correct ordering provider. They are requesting a new prior auth be resubmitted. Belenda Cruise sts that the patient has been made aware.  Provided Belenda Cruise the telephone number for [facility] advised him to ask for the prior auth dept and that Ciro Backer submitted the previous PA.

## 2019-07-17 NOTE — Telephone Encounter (Signed)
Patient calling to discuss - please call

## 2019-07-19 ENCOUNTER — Other Ambulatory Visit: Payer: Self-pay

## 2019-07-19 ENCOUNTER — Encounter (INDEPENDENT_AMBULATORY_CARE_PROVIDER_SITE_OTHER): Payer: Self-pay

## 2019-07-19 ENCOUNTER — Encounter (INDEPENDENT_AMBULATORY_CARE_PROVIDER_SITE_OTHER): Payer: Self-pay | Admitting: Vascular Surgery

## 2019-07-19 ENCOUNTER — Ambulatory Visit (INDEPENDENT_AMBULATORY_CARE_PROVIDER_SITE_OTHER): Payer: 59 | Admitting: Vascular Surgery

## 2019-07-19 VITALS — BP 139/78 | HR 80 | Resp 16 | Wt 161.8 lb

## 2019-07-19 DIAGNOSIS — I10 Essential (primary) hypertension: Secondary | ICD-10-CM

## 2019-07-19 DIAGNOSIS — I708 Atherosclerosis of other arteries: Secondary | ICD-10-CM | POA: Diagnosis not present

## 2019-07-19 DIAGNOSIS — I6529 Occlusion and stenosis of unspecified carotid artery: Secondary | ICD-10-CM | POA: Insufficient documentation

## 2019-07-19 DIAGNOSIS — R1084 Generalized abdominal pain: Secondary | ICD-10-CM

## 2019-07-19 DIAGNOSIS — E1159 Type 2 diabetes mellitus with other circulatory complications: Secondary | ICD-10-CM

## 2019-07-19 DIAGNOSIS — I6523 Occlusion and stenosis of bilateral carotid arteries: Secondary | ICD-10-CM

## 2019-07-19 DIAGNOSIS — J449 Chronic obstructive pulmonary disease, unspecified: Secondary | ICD-10-CM

## 2019-07-19 DIAGNOSIS — R195 Other fecal abnormalities: Secondary | ICD-10-CM

## 2019-07-19 NOTE — Progress Notes (Signed)
MRN : [id number]  Roy Richards is a 58 y.o. ([birth year]) male who presents with chief complaint of  Chief Complaint  Patient presents with  . New Patient (Initial Visit)    ref Roy Richards for carotid and subclavian stenosis  .  History of Present Illness: The patient is seen for evaluation of carotid stenosis. The carotid stenosis was identified after CT angiogram was obtained because of different in BP between the arms.  The patient denies amaurosis fugax. There is no recent history of TIA symptoms or focal motor deficits. There is no prior documented CVA.  He denies left arm claudication or pain. No ulcers of the fingers.  He denies visual changes or dizziness when looking up.  There is no history of migraine headaches. There is no history of seizures.  The patient is taking enteric-coated aspirin 81 mg daily.  The patient has a history of coronary artery disease, no recent episodes of angina or shortness of breath. The patient denies PAD or claudication symptoms. There is a history of hyperlipidemia which is being treated with a statin.    Current Meds  Medication Sig  . albuterol (VENTOLIN HFA) 108 (90 Base) MCG/ACT inhaler Inhale 1-2 puffs into the lungs every 6 (six) hours as needed.   Marland Kitchen amLODipine (NORVASC) 10 MG tablet Take 10 mg by mouth daily.   Marland Kitchen aspirin EC 81 MG tablet Take 81 mg by mouth daily.  Marland Kitchen atorvastatin (LIPITOR) 40 MG tablet Take 1 tablet (40 mg total) by mouth every evening. For cholesterol.  Marland Kitchen lisinopril (ZESTRIL) 20 MG tablet Take 20 mg by mouth daily.   . metFORMIN (GLUCOPHAGE) 500 MG tablet Take 1 tablet (500 mg total) by mouth 2 (two) times daily with a meal. For diabetes.  . Multiple Vitamins-Minerals (MULTIVITAMIN ADULT PO) Take by mouth.  . SYMBICORT 160-4.5 MCG/ACT inhaler Inhale 2 puffs into the lungs 2 (two) times daily.     Past Medical History:  Diagnosis Date  . Diabetes mellitus without complication ([place])   . Hyperlipidemia   .  Hypertension     Past Surgical History:  Procedure Laterality Date  . HERNIA REPAIR      Social History Social History   Tobacco Use  . Smoking status: Current Some Day Smoker    Types: Cigarettes  . Smokeless tobacco: Never Used  Substance Use Topics  . Alcohol use: Yes    Comment: occasionally  . Drug use: Never    Family History Family History  Problem Relation Age of Onset  . COPD Mother   . Heart disease Father   . Hyperlipidemia Father   . Hypertension Father   No family history of bleeding/clotting disorders, porphyria or autoimmune disease   No Known Allergies   REVIEW OF SYSTEMS (Negative unless checked)  Constitutional: [] Weight loss  [] Fever  [] Chills Cardiac: [] Chest pain   [] Chest pressure   [] Palpitations   [] Shortness of breath when laying flat   [] Shortness of breath with exertion. Vascular:  [] Pain in legs with walking   [] Pain in legs at rest  [] History of DVT   [] Phlebitis   [] Swelling in legs   [] Varicose veins   [] Non-healing ulcers Pulmonary:   [] Uses home oxygen   [] Productive cough   [] Hemoptysis   [] Wheeze  [] COPD   [] Asthma Neurologic:  [] Dizziness   [] Seizures   [] History of stroke   [] History of TIA  [] Aphasia   [] Vissual changes   [] Weakness or numbness in arm   [] Weakness  or numbness in leg Musculoskeletal:   [] Joint swelling   [] Joint pain   [] Low back pain Hematologic:  [] Easy bruising  [] Easy bleeding   [] Hypercoagulable state   [] Anemic Gastrointestinal:  [] Diarrhea   [] Vomiting  [] Gastroesophageal reflux/heartburn   [] Difficulty swallowing. Genitourinary:  [] Chronic kidney disease   [] Difficult urination  [] Frequent urination   [] Blood in urine Skin:  [] Rashes   [] Ulcers  Psychological:  [] History of anxiety   []  History of major depression.  Physical Examination  Vitals:   07/19/19 1311  BP: 139/78  Pulse: 80  Resp: 16  Weight: 161 lb 12.8 oz (73.4 kg)   Body mass index is 29.59 kg/m. Gen: WD/WN, NAD Head: [place]/AT, No  temporalis wasting.  Ear/Nose/Throat: Hearing grossly intact, nares w/o erythema or drainage, poor dentition Eyes: PER, EOMI, sclera nonicteric.  Neck: Supple, no masses.  No bruit or JVD.  Pulmonary:  Good air movement, clear to auscultation bilaterally, no use of accessory muscles.  Cardiac: RRR, normal S1, S2, no Murmurs. Vascular:  Left carotid and subclavian bruit noted Vessel Right Left  Radial Palpable Not Palpable  Ulnar Palpable Not Palpable  Brachial Palpable Trace Palpable  Carotid Palpable Palpable  Gastrointestinal: soft, non-distended. No guarding/no peritoneal signs.  Musculoskeletal: M/S 5/5 throughout.  No deformity or atrophy.  Neurologic: CN 2-12 intact. Pain and light touch intact in extremities.  Symmetrical.  Speech is fluent. Motor exam as listed above. Psychiatric: Judgment intact, Mood & affect appropriate for pt's clinical situation. Dermatologic: No rashes or ulcers noted.  No changes consistent with cellulitis. Lymph : No Cervical lymphadenopathy, no lichenification or skin changes of chronic lymphedema.  CBC Lab Results  Component Value Date   WBC 9.0 06/26/2019   HGB 15.9 06/26/2019   HCT 47.9 06/26/2019   MCV 92.1 06/26/2019   PLT 249.0 06/26/2019    BMET    Component Value Date/Time   NA 139 06/26/2019 1126   K 4.4 06/26/2019 1126   CL 101 06/26/2019 1126   CO2 32 06/26/2019 1126   GLUCOSE 95 06/26/2019 1126   BUN 11 06/26/2019 1126   CREATININE 0.87 06/26/2019 1126   CALCIUM 9.6 06/26/2019 1126   GFRNONAA >60 06/18/2019 1625   GFRAA >60 06/18/2019 1625   CrCl cannot be calculated (Patient's most recent lab result is older than the maximum 21 days allowed.).  COAG No results found for: INR, PROTIME  Radiology Vas [place] Carotid  Result Date: 07/03/2019 Carotid Arterial Duplex Study Other Factors: Asymmetric blood pressures. Performing Technologist: Reece AgarValarie Baldwin RT (R)(VS)  Examination Guidelines: A complete evaluation includes B-mode  imaging, spectral Doppler, color Doppler, and power Doppler as needed of all accessible portions of each vessel. Bilateral testing is considered an integral part of a complete examination. Limited examinations for reoccurring indications may be performed as noted.  Right Carotid Findings: +----------+--------+--------+--------+------------------+--------------------+           PSV cm/sEDV cm/sStenosisPlaque DescriptionComments             +----------+--------+--------+--------+------------------+--------------------+ CCA Prox  114     15                                intimal thickening   +----------+--------+--------+--------+------------------+--------------------+ CCA Mid   95      16  intimal thickening   +----------+--------+--------+--------+------------------+--------------------+ CCA Distal83      12                                intimal thickening   +----------+--------+--------+--------+------------------+--------------------+ ICA Prox  174     35              heterogenous                           +----------+--------+--------+--------+------------------+--------------------+ ICA Mid   87      18                                ICA/CCA ratio = 1.83 +----------+--------+--------+--------+------------------+--------------------+ ICA Distal69      20                                                     +----------+--------+--------+--------+------------------+--------------------+ ECA       185     22                                                     +----------+--------+--------+--------+------------------+--------------------+ +----------+--------+-------+----------------+-------------------+           PSV cm/sEDV cmsDescribe        Arm Pressure (mmHG) +----------+--------+-------+----------------+-------------------+ [id number]            Multiphasic, [id number]                  +----------+--------+-------+----------------+-------------------+ +---------+--------+--+--------+--+---------+ VertebralPSV cm/s67EDV cm/s16Antegrade +---------+--------+--+--------+--+---------+ Left Carotid Findings: +----------+--------+--------+--------+------------------+--------------------+           PSV cm/sEDV cm/sStenosisPlaque DescriptionComments             +----------+--------+--------+--------+------------------+--------------------+ CCA Prox  124     25                                intimal thickening   +----------+--------+--------+--------+------------------+--------------------+ CCA Mid   105     21                                intimal thickening   +----------+--------+--------+--------+------------------+--------------------+ CCA Distal94      17                                                     +----------+--------+--------+--------+------------------+--------------------+ ICA Prox  107     25                                ICA/CCA ratio = 1.02 +----------+--------+--------+--------+------------------+--------------------+ ICA Mid   93      28                                                     +----------+--------+--------+--------+------------------+--------------------+  ICA Distal92      24                                                     +----------+--------+--------+--------+------------------+--------------------+ ECA       167     22                                                     +----------+--------+--------+--------+------------------+--------------------+ +----------+--------+--------+----------+-------------------+           PSV cm/sEDV cm/sDescribe  Arm Pressure (mmHG) +----------+--------+--------+----------+-------------------+ [id number]             Monophasic109                 +----------+--------+--------+----------+-------------------+ +---------+--------+--+--------+-+----------+  VertebralPSV cm/s50EDV cm/s8Retrograde +---------+--------+--+--------+-+----------+  Summary: Right Carotid: Velocities in the right ICA are consistent with a 40-59%                stenosis. The ECA appears >50% stenosed. Left Carotid: Velocities in the left ICA are consistent with a 1-39% stenosis.               The ECA appears <50% stenosed. Findings appear to be consistant               with a left subclavian steal.  *See table(s) above for measurements and observations.  Electronically signed by Festus Barren MD on 07/03/2019 at 5:28:31 PM.    Final      Assessment/Plan 1. Bilateral carotid artery stenosis Recommend:  Given the patient's asymptomatic subcritical stenosis no further invasive testing or surgery at this time.  Duplex ultrasound shows <50% stenosis bilaterally.  Continue antiplatelet therapy as prescribed Continue management of CAD, HTN and Hyperlipidemia Healthy heart diet,  encouraged exercise at least 4 times per week.  Follow up in 12 months with duplex ultrasound and physical exam   2. Left subclavian artery occlusion No symptoms at this time so I do not recommend intervention.  3. Accelerated hypertension Given patient's arterial disease optimal control of the patient's hypertension is important.  BP is not acceptable today  Renal artery stenosis must be excluded.  No invasive studies or intervention is indicated at this time.  The patient will continue the current antihypertensive medications, no changes at this time.  The primary medical service will continue aggressive antihypertensive therapy as per the AHA guidelines   - Renal; Future  4. Chronic obstructive pulmonary disease, unspecified COPD type (HCC) Continue pulmonary medications and aerosols as already ordered, these medications have been reviewed and there are no changes at this time.    5. Type 2 diabetes mellitus with other circulatory complication, without long-term current use of  insulin (HCC) Continue hypoglycemic medications as already ordered, these medications have been reviewed and there are no changes at this time.  Hgb A1C to be monitored as already arranged by primary service      Levora Dredge, MD  07/19/2019 1:21 PM

## 2019-07-20 ENCOUNTER — Other Ambulatory Visit (INDEPENDENT_AMBULATORY_CARE_PROVIDER_SITE_OTHER): Payer: 59

## 2019-07-20 DIAGNOSIS — R1084 Generalized abdominal pain: Secondary | ICD-10-CM | POA: Diagnosis not present

## 2019-07-20 DIAGNOSIS — R195 Other fecal abnormalities: Secondary | ICD-10-CM | POA: Diagnosis not present

## 2019-07-20 LAB — CBC WITH DIFFERENTIAL/PLATELET
Basophils Absolute: 0.1 10*3/uL (ref 0.0–0.1)
Basophils Relative: 0.5 % (ref 0.0–3.0)
Eosinophils Absolute: 0.2 10*3/uL (ref 0.0–0.7)
Eosinophils Relative: 1.7 % (ref 0.0–5.0)
HCT: 44.9 % (ref 39.0–52.0)
Hemoglobin: 14.9 g/dL (ref 13.0–17.0)
Lymphocytes Relative: 21.1 % (ref 12.0–46.0)
Lymphs Abs: 2.3 10*3/uL (ref 0.7–4.0)
MCHC: 33.1 g/dL (ref 30.0–36.0)
MCV: 92.2 fl (ref 78.0–100.0)
Monocytes Absolute: 0.7 10*3/uL (ref 0.1–1.0)
Monocytes Relative: 6 % (ref 3.0–12.0)
Neutro Abs: 7.8 10*3/uL — ABNORMAL HIGH (ref 1.4–7.7)
Neutrophils Relative %: 70.7 % (ref 43.0–77.0)
Platelets: 230 10*3/uL (ref 150.0–400.0)
RBC: 4.87 Mil/uL (ref 4.22–5.81)
RDW: 13.1 % (ref 11.5–15.5)
WBC: 11 10*3/uL — ABNORMAL HIGH (ref 4.0–10.5)

## 2019-07-20 LAB — BASIC METABOLIC PANEL
BUN: 18 mg/dL (ref 6–23)
CO2: 32 mEq/L (ref 19–32)
Calcium: 9.1 mg/dL (ref 8.4–10.5)
Chloride: 100 mEq/L (ref 96–112)
Creatinine, Ser: 0.86 mg/dL (ref 0.40–1.50)
GFR: 90.68 mL/min (ref >60.00)
Glucose, Bld: 226 mg/dL — ABNORMAL HIGH (ref 70–99)
Potassium: 3.6 mEq/L (ref 3.5–5.1)
Sodium: 139 mEq/L (ref 135–145)

## 2019-07-20 NOTE — Telephone Encounter (Signed)
Patient sent a new message and Anda Kraft had reply to patient regarding message below

## 2019-07-20 NOTE — Telephone Encounter (Signed)
Will you please call him and notify him that I need him to come pick up a stool kit and return today. Also I need some additional blood work for which I did not mention in my message.

## 2019-07-22 DIAGNOSIS — I10 Essential (primary) hypertension: Secondary | ICD-10-CM | POA: Insufficient documentation

## 2019-07-22 DIAGNOSIS — I708 Atherosclerosis of other arteries: Secondary | ICD-10-CM | POA: Insufficient documentation

## 2019-07-22 DIAGNOSIS — E119 Type 2 diabetes mellitus without complications: Secondary | ICD-10-CM | POA: Insufficient documentation

## 2019-07-23 ENCOUNTER — Encounter (INDEPENDENT_AMBULATORY_CARE_PROVIDER_SITE_OTHER): Payer: Self-pay

## 2019-07-24 LAB — GASTROINTESTINAL PATHOGEN PANEL PCR
C. difficile Tox A/B, PCR: NOT DETECTED
Campylobacter, PCR: NOT DETECTED
Cryptosporidium, PCR: NOT DETECTED
E coli (ETEC) LT/ST PCR: NOT DETECTED
E coli (STEC) stx1/stx2, PCR: NOT DETECTED
E coli 0157, PCR: NOT DETECTED
Giardia lamblia, PCR: NOT DETECTED
Norovirus, PCR: NOT DETECTED
Rotavirus A, PCR: NOT DETECTED
Salmonella, PCR: NOT DETECTED
Shigella, PCR: NOT DETECTED

## 2019-07-26 ENCOUNTER — Ambulatory Visit (INDEPENDENT_AMBULATORY_CARE_PROVIDER_SITE_OTHER): Payer: 59 | Admitting: Nurse Practitioner

## 2019-07-26 ENCOUNTER — Other Ambulatory Visit: Payer: Self-pay

## 2019-07-26 ENCOUNTER — Ambulatory Visit (INDEPENDENT_AMBULATORY_CARE_PROVIDER_SITE_OTHER): Payer: 59

## 2019-07-26 ENCOUNTER — Encounter (INDEPENDENT_AMBULATORY_CARE_PROVIDER_SITE_OTHER): Payer: Self-pay | Admitting: Nurse Practitioner

## 2019-07-26 VITALS — BP 132/73 | HR 71 | Resp 16 | Wt 161.6 lb

## 2019-07-26 DIAGNOSIS — R109 Unspecified abdominal pain: Secondary | ICD-10-CM | POA: Insufficient documentation

## 2019-07-26 DIAGNOSIS — I6523 Occlusion and stenosis of bilateral carotid arteries: Secondary | ICD-10-CM | POA: Diagnosis not present

## 2019-07-26 DIAGNOSIS — I10 Essential (primary) hypertension: Secondary | ICD-10-CM

## 2019-07-26 DIAGNOSIS — I708 Atherosclerosis of other arteries: Secondary | ICD-10-CM | POA: Diagnosis not present

## 2019-07-26 DIAGNOSIS — R1030 Lower abdominal pain, unspecified: Secondary | ICD-10-CM

## 2019-07-26 DIAGNOSIS — I701 Atherosclerosis of renal artery: Secondary | ICD-10-CM | POA: Insufficient documentation

## 2019-07-26 DIAGNOSIS — R1084 Generalized abdominal pain: Secondary | ICD-10-CM | POA: Insufficient documentation

## 2019-07-26 NOTE — Progress Notes (Signed)
SUBJECTIVE:  Patient ID: Roy Richards, male    DOB: [birth year], 58 y.o.   MRN: [id number] Chief Complaint  Patient presents with  . Follow-up    ultrasound follow up    HPI  Roy Richards is a 58 y.o. male that presents today for follow-up studies with regards to concerns for renal hypertension.  At the previous appointment the patient's blood pressure was elevated however today his blood pressure is within good control.  The patient states that he primarily has pain in his abdomen that comes and goes.  He denies any precipitating factors.  He denies having this pain following eating.  He denies any nausea or vomiting.  He denies any bloody stools.  The patient was recently diagnosed with colitis and describes having a similar pain.  The patient also has presence of gallstones on a recent CT scan.  He denies any fever, chills, nausea, vomiting or diarrhea.  He denies any chest pain or shortness of breath  Today noninvasive study showed the patient has a 1 to 59% stenosis of the right renal artery.  The left renal artery has no evidence of stenosis.  Normal size kidneys bilaterally.  Past Medical History:  Diagnosis Date  . Diabetes mellitus without complication (HCC)   . Hyperlipidemia   . Hypertension     Past Surgical History:  Procedure Laterality Date  . HERNIA REPAIR      Social History   Socioeconomic History  . Marital status: Single    Spouse name: Not on file  . Number of children: Not on file  . Years of education: Not on file  . Highest education level: Not on file  Occupational History  . Not on file  Social Needs  . Financial resource strain: Not on file  . Food insecurity    Worry: Not on file    Inability: Not on file  . Transportation needs    Medical: Not on file    Non-medical: Not on file  Tobacco Use  . Smoking status: Current Some Day Smoker    Types: Cigarettes  . Smokeless tobacco: Never Used  Substance and Sexual Activity  . Alcohol use:  Yes    Comment: occasionally  . Drug use: Never  . Sexual activity: Yes  Lifestyle  . Physical activity    Days per week: Not on file    Minutes per session: Not on file  . Stress: Not on file  Relationships  . Social Musician on phone: Not on file    Gets together: Not on file    Attends religious service: Not on file    Active member of club or organization: Not on file    Attends meetings of clubs or organizations: Not on file    Relationship status: Not on file  . Intimate partner violence    Fear of current or ex partner: Not on file    Emotionally abused: Not on file    Physically abused: Not on file    Forced sexual activity: Not on file  Other Topics Concern  . Not on file  Social History Narrative  . Not on file    Family History  Problem Relation Age of Onset  . COPD Mother   . Heart disease Father   . Hyperlipidemia Father   . Hypertension Father     No Known Allergies   Review of Systems   Review of Systems: Negative Unless Checked Constitutional: []   Weight loss  [] Fever  [] Chills Cardiac: [] Chest pain   []  Atrial Fibrillation  [] Palpitations   [] Shortness of breath when laying flat   [] Shortness of breath with exertion. [] Shortness of breath at rest Vascular:  [] Pain in legs with walking   [] Pain in legs with standing [] Pain in legs when laying flat   [] Claudication    [] Pain in feet when laying flat    [] History of DVT   [] Phlebitis   [] Swelling in legs   [] Varicose veins   [] Non-healing ulcers Pulmonary:   [] Uses home oxygen   [] Productive cough   [] Hemoptysis   [] Wheeze  [x] COPD   [] Asthma Neurologic:  [] Dizziness   [] Seizures  [] Blackouts [] History of stroke   [] History of TIA  [] Aphasia   [] Temporary Blindness   [] Weakness or numbness in arm   [] Weakness or numbness in leg Musculoskeletal:   [] Joint swelling   [] Joint pain   [] Low back pain  []  History of Knee Replacement [] Arthritis [] back Surgeries  []  Spinal Stenosis    Hematologic:   [] Easy bruising  [] Easy bleeding   [] Hypercoagulable state   [] Anemic Gastrointestinal:  [] Diarrhea   [] Vomiting  [] Gastroesophageal reflux/heartburn   [] Difficulty swallowing. [x] Abdominal pain Genitourinary:  [] Chronic kidney disease   [] Difficult urination  [] Anuric   [] Blood in urine [] Frequent urination  [] Burning with urination   [] Hematuria Skin:  [] Rashes   [] Ulcers [] Wounds Psychological:  [] History of anxiety   []  History of major depression  []  Memory Difficulties      OBJECTIVE:   Physical Exam  BP 132/73 (BP Location: Right Arm)   Pulse 71   Resp 16   Wt 161 lb 9.6 oz (73.3 kg)   BMI 29.56 kg/m   Gen: WD/WN, NAD Head: [place]/AT, No temporalis wasting.  Ear/Nose/Throat: Hearing grossly intact, nares w/o erythema or drainage Eyes: PER, EOMI, sclera nonicteric.  Neck: Supple, no masses.  No JVD.  Pulmonary:  Good air movement, no use of accessory muscles.  Cardiac: RRR Vascular:  Vessel Right Left  Radial Palpable Palpable   Gastrointestinal: soft, non-distended. No guarding/no peritoneal signs.  Musculoskeletal: M/S 5/5 throughout.  No deformity or atrophy.  Neurologic: Pain and light touch intact in extremities.  Symmetrical.  Speech is fluent. Motor exam as listed above. Psychiatric: Judgment intact, Mood & affect appropriate for pt's clinical situation. Dermatologic: No Venous rashes. No Ulcers Noted.  No changes consistent with cellulitis. Lymph : No Cervical lymphadenopathy, no lichenification or skin changes of chronic lymphedema.       ASSESSMENT AND PLAN:  1. Left subclavian artery occlusion Currently has no symptoms of pain or issues.  We will continue regular follow-up intervals while checking his carotid duplex.  Patient is advised to contact us if his pain becomes numb, cold or he begins to have motor deficits.  2. Bilateral carotid artery stenosis Currently doing well.  Recently had an ultrasound.  Follow-up will remain at 1 year.  3. Essential  hypertension Continue antihypertensive medications as already ordered, these medications have been reviewed and there are no changes at this time.   4. Lower abdominal pain The patient was concerned that his lower abdominal pain could be something related to his renal artery stenosis.  However based on the patient's description of pain it likely is not vascular in nature.  The patient denies any postprandial pain or food phobia which is generally consistent with mesenteric artery stenosis.  However the patient recently had colitis and this may be the cause of his continued  pain.  Patient also has gallstones which could also be chronic issues.  The patient is advised to speak with PCP regarding further work-up of his abdominal pain. 5. Renal artery stenosis (HCC) Recommend:  The patient has evidence of atherosclerotic changes of the renal artery.  At this time the patient's blood pressure is fairly well controlled.  Patient does not need angiography of the renal artery given the good control of his hypertension.    However, if at any point the patient's BP becomes acutely worse then intervention would be strongly encouraged.  The patient voices understanding of this plan and agrees.      Current Outpatient Medications on File Prior to Visit  Medication Sig Dispense Refill  . albuterol (VENTOLIN HFA) 108 (90 Base) MCG/ACT inhaler Inhale 1-2 puffs into the lungs every 6 (six) hours as needed.     Marland Kitchen. amLODipine (NORVASC) 10 MG tablet Take 10 mg by mouth daily.     Marland Kitchen. aspirin EC 81 MG tablet Take 81 mg by mouth daily.    Marland Kitchen. atorvastatin (LIPITOR) 40 MG tablet Take 1 tablet (40 mg total) by mouth every evening. For cholesterol. 90 tablet 1  . lisinopril (ZESTRIL) 20 MG tablet Take 20 mg by mouth daily.     . metFORMIN (GLUCOPHAGE) 500 MG tablet Take 1 tablet (500 mg total) by mouth 2 (two) times daily with a meal. For diabetes. 180 tablet 1  . Multiple Vitamins-Minerals (MULTIVITAMIN ADULT PO) Take  by mouth.    . SYMBICORT 160-4.5 MCG/ACT inhaler Inhale 2 puffs into the lungs 2 (two) times daily.      No current facility-administered medications on file prior to visit.     There are no Patient Instructions on file for this visit. No follow-ups on file.   Georgiana SpinnerFallon E Danish Ruffins, NP  This note was completed with Office managerDragon Dictation.  Any errors are purely unintentional.

## 2019-07-30 ENCOUNTER — Ambulatory Visit: Payer: 59 | Admitting: Primary Care

## 2019-07-31 ENCOUNTER — Ambulatory Visit: Payer: 59 | Admitting: Primary Care

## 2019-07-31 ENCOUNTER — Encounter: Payer: Self-pay | Admitting: Primary Care

## 2019-07-31 ENCOUNTER — Telehealth: Payer: Self-pay | Admitting: *Deleted

## 2019-07-31 ENCOUNTER — Other Ambulatory Visit: Payer: Self-pay

## 2019-07-31 VITALS — BP 130/80 | HR 74 | Temp 97.9°F | Ht 62.0 in | Wt 162.5 lb

## 2019-07-31 DIAGNOSIS — Z87891 Personal history of nicotine dependence: Secondary | ICD-10-CM

## 2019-07-31 DIAGNOSIS — I701 Atherosclerosis of renal artery: Secondary | ICD-10-CM | POA: Diagnosis not present

## 2019-07-31 DIAGNOSIS — F419 Anxiety disorder, unspecified: Secondary | ICD-10-CM

## 2019-07-31 DIAGNOSIS — F329 Major depressive disorder, single episode, unspecified: Secondary | ICD-10-CM

## 2019-07-31 DIAGNOSIS — I10 Essential (primary) hypertension: Secondary | ICD-10-CM | POA: Diagnosis not present

## 2019-07-31 DIAGNOSIS — Z122 Encounter for screening for malignant neoplasm of respiratory organs: Secondary | ICD-10-CM

## 2019-07-31 DIAGNOSIS — F17200 Nicotine dependence, unspecified, uncomplicated: Secondary | ICD-10-CM | POA: Insufficient documentation

## 2019-07-31 MED ORDER — CITALOPRAM HYDROBROMIDE 10 MG PO TABS: 10.0000 mg | ORAL_TABLET | Freq: Every day | ORAL | 1 refills | Status: DC

## 2019-07-31 NOTE — Patient Instructions (Signed)
Start citalopram 10 mg once daily for anxiety and depression.   You will be contacted regarding your referral to lung cancer screening.  Please let us know if you have not been contacted within two weeks.   Please re-connect with your therapist as discussed.   Please schedule a follow up appointment in 1 month.  It was a pleasure to see you today!
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
## 2019-07-31 NOTE — Telephone Encounter (Signed)
Received referral for initial lung cancer screening scan. Contacted patient and obtained smoking history,(former, quit 07/14/18, 40 pack year) as well as answering questions related to screening process. Patient denies signs of lung cancer such as weight loss or hemoptysis. Patient denies comorbidity that would prevent curative treatment if lung cancer were found. Patient is scheduled for shared decision making visit and CT scan on (date TBD).

## 2019-07-31 NOTE — Assessment & Plan Note (Signed)
Diagnosed per vascular surgery. Doing well on current regimen.

## 2019-07-31 NOTE — Telephone Encounter (Signed)
Received referral for low dose lung cancer screening CT scan. Message left at phone number listed in EMR for patient to call me back to facilitate scheduling scan.  

## 2019-07-31 NOTE — Assessment & Plan Note (Signed)
Improved, continue current regimen. Has followed with vascular surgery.

## 2019-07-31 NOTE — Assessment & Plan Note (Signed)
Quit in October 2020, 40 pack year history. Asymptomatic. Referral placed for lung cancer screening.
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
## 2019-08-01 ENCOUNTER — Encounter: Payer: Self-pay | Admitting: *Deleted

## 2019-08-06 ENCOUNTER — Inpatient Hospital Stay: Payer: 59 | Attending: Oncology | Admitting: Oncology

## 2019-08-06 ENCOUNTER — Other Ambulatory Visit: Payer: Self-pay

## 2019-08-06 DIAGNOSIS — Z87891 Personal history of nicotine dependence: Secondary | ICD-10-CM

## 2019-08-06 NOTE — Progress Notes (Signed)
Virtual Visit via Video Note  I connected with [name] on 08/06/19 at  9:30 AM EST by a video enabled telemedicine application and verified that I am speaking with the correct person using two identifiers.  Location: Patient: Home Provider: Office   I discussed the limitations of evaluation and management by telemedicine and the availability of in person appointments. The patient expressed understanding and agreed to proceed.  I discussed the assessment and treatment plan with the patient. The patient was provided an opportunity to ask questions and all were answered. The patient agreed with the plan and demonstrated an understanding of the instructions.   The patient was advised to call back or seek an in-person evaluation if the symptoms worsen or if the condition fails to improve as anticipated.   In accordance with CMS guidelines, patient has met eligibility criteria including age, absence of signs or symptoms of lung cancer.  Social History   Tobacco Use  . Smoking status: Current Some Day Smoker    Types: Cigarettes  . Smokeless tobacco: Never Used  Substance Use Topics  . Alcohol use: Yes    Comment: occasionally  . Drug use: Never      A shared decision-making session was conducted prior to the performance of CT scan. This includes one or more decision aids, includes benefits and harms of screening, follow-up diagnostic testing, over-diagnosis, false positive rate, and total radiation exposure.   Counseling on the importance of adherence to annual lung cancer LDCT screening, impact of co-morbidities, and ability or willingness to undergo diagnosis and treatment is imperative for compliance of the program.   Counseling on the importance of continued smoking cessation for former smokers; the importance of smoking cessation for current smokers, and information about tobacco cessation interventions have been given to patient including [place] and [address] quit [place]  programs.   Written order for lung cancer screening with LDCT has been given to the patient and any and all questions have been answered to the best of my abilities.    Yearly follow up will be coordinated by Burgess Estelle, Thoracic Navigator.  I provided 15 minutes of face-to-face video visit time during this encounter, and > 50% was spent counseling as documented under my assessment & plan.   Jacquelin Hawking, NP

## 2019-08-07 ENCOUNTER — Ambulatory Visit
Admission: RE | Admit: 2019-08-07 | Discharge: 2019-08-07 | Disposition: A | Discharge status: Home / Self Care | Payer: 59 | Source: Ambulatory Visit | Attending: Oncology | Admitting: Oncology

## 2019-08-07 DIAGNOSIS — Z122 Encounter for screening for malignant neoplasm of respiratory organs: Secondary | ICD-10-CM | POA: Insufficient documentation

## 2019-08-07 DIAGNOSIS — Z87891 Personal history of nicotine dependence: Secondary | ICD-10-CM | POA: Insufficient documentation

## 2019-08-13 ENCOUNTER — Encounter: Payer: Self-pay | Admitting: *Deleted

## 2019-08-29 ENCOUNTER — Ambulatory Visit: Payer: Self-pay | Admitting: Primary Care

## 2019-08-29 ENCOUNTER — Other Ambulatory Visit: Payer: Self-pay

## 2019-08-29 VITALS — BP 142/82 | HR 72 | Temp 97.5°F | Ht 62.0 in | Wt 161.2 lb

## 2019-08-29 DIAGNOSIS — F419 Anxiety disorder, unspecified: Secondary | ICD-10-CM

## 2019-08-29 DIAGNOSIS — F329 Major depressive disorder, single episode, unspecified: Secondary | ICD-10-CM

## 2019-08-29 MED ORDER — CITALOPRAM HYDROBROMIDE 20 MG PO TABS: 20.0000 mg | ORAL_TABLET | Freq: Every day | ORAL | 1 refills | Status: DC

## 2019-08-29 NOTE — Patient Instructions (Signed)
We've increased the dose of your citalopram to 20 mg, I sent a new prescription to your pharmacy.  We will see you next month for diabetes follow up.  It was a pleasure to see you today!
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
## 2019-08-29 NOTE — Assessment & Plan Note (Signed)
Improved slightly since initiation of citalopram 10 mg, could benefit from dose increase. He contracts to safety.   Rx for citalopram 20 mg sent to pharmacy. He will update [place] in 1 month.

## 2019-09-11 ENCOUNTER — Telehealth: Payer: Self-pay | Admitting: Primary Care

## 2019-09-11 NOTE — Telephone Encounter (Signed)
Error

## 2019-09-12 DIAGNOSIS — F4310 Post-traumatic stress disorder, unspecified: Secondary | ICD-10-CM | POA: Insufficient documentation

## 2019-09-12 DIAGNOSIS — F3181 Bipolar II disorder: Secondary | ICD-10-CM | POA: Insufficient documentation

## 2019-09-12 DIAGNOSIS — F902 Attention-deficit hyperactivity disorder, combined type: Secondary | ICD-10-CM | POA: Insufficient documentation

## 2019-09-12 DIAGNOSIS — F1421 Cocaine dependence, in remission: Secondary | ICD-10-CM | POA: Insufficient documentation

## 2019-09-12 DIAGNOSIS — F1220 Cannabis dependence, uncomplicated: Secondary | ICD-10-CM | POA: Insufficient documentation

## 2019-09-12 DIAGNOSIS — F1020 Alcohol dependence, uncomplicated: Secondary | ICD-10-CM | POA: Insufficient documentation

## 2019-09-12 HISTORY — DX: Attention-deficit hyperactivity disorder, combined type: F90.2

## 2019-09-12 HISTORY — DX: Alcohol dependence, uncomplicated: F10.20

## 2019-09-12 HISTORY — DX: Cocaine dependence, in remission: F14.21

## 2019-09-12 HISTORY — DX: Bipolar II disorder: F31.81

## 2019-10-03 ENCOUNTER — Telehealth: Payer: Self-pay | Admitting: Cardiology

## 2019-10-03 NOTE — Telephone Encounter (Signed)
Spoke with patient. He is unable to follow up with Dr. Azucena Cecil at this time because he does not have insurance at the moment. He has CT Angio of the Neck at [facility] due to insurance which was ordered by Dr Azucena Cecil. Results are in CareEverywhere.  Patient states he got the results in other visit with vascular surgery. However, patient has not received further evaluation with doctor here.  Advised I will route to Dr. Azucena Cecil for review and any further advice for patient as he cannot come in for appointment right now.

## 2019-10-03 NOTE — Telephone Encounter (Signed)
Patient calling Patient will not have insurance for a few months so he had to cancel his 3 month follow up  Would like to discuss with nurse what he needs to complete and to go over any results he may already have  Please call to discuss

## 2019-10-04 NOTE — Telephone Encounter (Signed)
Debbe Odea, MD  You 18 hours ago (4:07 PM)   Thanks for the update. Patient can follow-up with primary care provider and vascular surgery as needed for subclavian stenosis in order chronic medical conditions.   Routing comment    No answer. Ok per [employer], left detailed message with the recommendations and to call back if he has any further questions or concerns arise.

## 2019-10-05 ENCOUNTER — Ambulatory Visit: Payer: 59 | Admitting: Cardiology

## 2019-10-08 ENCOUNTER — Ambulatory Visit (INDEPENDENT_AMBULATORY_CARE_PROVIDER_SITE_OTHER): Payer: Self-pay | Admitting: Primary Care

## 2019-10-08 ENCOUNTER — Other Ambulatory Visit: Payer: Self-pay

## 2019-10-08 VITALS — BP 128/66 | HR 82 | Temp 97.2°F | Ht 62.0 in | Wt 162.5 lb

## 2019-10-08 DIAGNOSIS — F329 Major depressive disorder, single episode, unspecified: Secondary | ICD-10-CM

## 2019-10-08 DIAGNOSIS — I700 Atherosclerosis of aorta: Secondary | ICD-10-CM

## 2019-10-08 DIAGNOSIS — E785 Hyperlipidemia, unspecified: Secondary | ICD-10-CM | POA: Insufficient documentation

## 2019-10-08 DIAGNOSIS — I6523 Occlusion and stenosis of bilateral carotid arteries: Secondary | ICD-10-CM

## 2019-10-08 DIAGNOSIS — I10 Essential (primary) hypertension: Secondary | ICD-10-CM

## 2019-10-08 DIAGNOSIS — F419 Anxiety disorder, unspecified: Secondary | ICD-10-CM

## 2019-10-08 DIAGNOSIS — E119 Type 2 diabetes mellitus without complications: Secondary | ICD-10-CM

## 2019-10-08 DIAGNOSIS — I708 Atherosclerosis of other arteries: Secondary | ICD-10-CM

## 2019-10-08 LAB — LIPID PANEL
Cholesterol: 102 mg/dL (ref 0–200)
HDL: 38.1 mg/dL — ABNORMAL LOW (ref >39.00)
LDL Cholesterol: 38 mg/dL (ref 0–99)
NonHDL: 63.55
Total CHOL/HDL Ratio: 3
Triglycerides: 130 mg/dL (ref 0.0–149.0)
VLDL: 26 mg/dL (ref 0.0–40.0)

## 2019-10-08 LAB — POCT GLYCOSYLATED HEMOGLOBIN (HGB A1C): Hemoglobin A1C: 6.5 % — AB (ref 4.0–5.6)

## 2019-10-08 LAB — HEPATIC FUNCTION PANEL
ALT: 44 U/L (ref 0–53)
AST: 26 U/L (ref 0–37)
Albumin: 4.4 g/dL (ref 3.5–5.2)
Alkaline Phosphatase: 74 U/L (ref 39–117)
Bilirubin, Direct: 0.1 mg/dL (ref 0.0–0.3)
Total Bilirubin: 0.4 mg/dL (ref 0.2–1.2)
Total Protein: 6.9 g/dL (ref 6.0–8.3)

## 2019-10-08 NOTE — Assessment & Plan Note (Signed)
Due for annual reading in Fall 2021. Compliant to statin therapy, repeat lipids pending.

## 2019-10-08 NOTE — Assessment & Plan Note (Signed)
LDL above goal in October 2020, repeat lipids pending. Continue atorvastatin.

## 2019-10-08 NOTE — Assessment & Plan Note (Signed)
Compliant to statin therapy, repeat lipids pending. °

## 2019-10-08 NOTE — Assessment & Plan Note (Signed)
Stable and at goal in the office today, continue current regimen.

## 2019-10-08 NOTE — Assessment & Plan Note (Signed)
Now following with psychiatry who increased citalopram to 40 mg and added prazosin 1 mg HS. Continue current regimen.

## 2019-10-08 NOTE — Progress Notes (Signed)
Subjective:    Patient ID: Roy Richards, male    DOB: 1960-10-01, 59 y.o.   MRN: [id number]  HPI  This visit occurred during the SARS-CoV-2 public health emergency.  Safety protocols were in place, including screening questions prior to the visit, additional usage of staff PPE, and extensive cleaning of exam room while observing appropriate contact time as indicated for disinfecting solutions.   Roy Richards is a 59 year old male who presents today for follow up of diabetes. He is also following with psychiatry for PTSD.   Current medications include: Metformin 500 mg twice daily  Last A1C: 7.1 in October 2020, 6.5 today Last Eye Exam: Due this year Last Foot Exam: Due today Pneumonia Vaccination: Never completed, declines  ACE/ARB: Lisinopril  Statin: atorvastatin   BP Readings from Last 3 Encounters:  10/08/19 128/66  08/29/19 (!) 142/82  07/31/19 130/80      Review of Systems  Respiratory: Negative for shortness of breath.   Cardiovascular: Negative for chest pain.  Neurological: Negative for dizziness, numbness and headaches.       Past Medical History:  Diagnosis Date  . Acute colitis 06/26/2019  . Diabetes mellitus without complication ([place])   . Hyperlipidemia   . Hypertension      Social History   Socioeconomic History  . Marital status: Single    Spouse name: Not on file  . Number of children: Not on file  . Years of education: Not on file  . Highest education level: Not on file  Occupational History  . Not on file  Tobacco Use  . Smoking status: Current Some Day Smoker    Types: Cigarettes  . Smokeless tobacco: Never Used  Substance and Sexual Activity  . Alcohol use: Yes    Comment: occasionally  . Drug use: Never  . Sexual activity: Yes  Other Topics Concern  . Not on file  Social History Narrative  . Not on file   Social Determinants of Health   Financial Resource Strain:   . Difficulty of Paying Living Expenses: Not on file  Food  Insecurity:   . Worried About Charity fundraiser in the Last Year: Not on file  . Ran Out of Food in the Last Year: Not on file  Transportation Needs:   . Lack of Transportation (Medical): Not on file  . Lack of Transportation (Non-Medical): Not on file  Physical Activity:   . Days of Exercise per Week: Not on file  . Minutes of Exercise per Session: Not on file  Stress:   . Feeling of Stress : Not on file  Social Connections:   . Frequency of Communication with Friends and Family: Not on file  . Frequency of Social Gatherings with Friends and Family: Not on file  . Attends Religious Services: Not on file  . Active Member of Clubs or Organizations: Not on file  . Attends Archivist Meetings: Not on file  . Marital Status: Not on file  Intimate Partner Violence:   . Fear of Current or Ex-Partner: Not on file  . Emotionally Abused: Not on file  . Physically Abused: Not on file  . Sexually Abused: Not on file    Past Surgical History:  Procedure Laterality Date  . HERNIA REPAIR      Family History  Problem Relation Age of Onset  . COPD Mother   . Heart disease Father   . Hyperlipidemia Father   . Hypertension Father  No Known Allergies  Current Outpatient Medications on File Prior to Visit  Medication Sig Dispense Refill  . albuterol (VENTOLIN HFA) 108 (90 Base) MCG/ACT inhaler Inhale 1-2 puffs into the lungs every 6 (six) hours as needed.     Marland Kitchen amLODipine (NORVASC) 10 MG tablet Take 10 mg by mouth daily.     Marland Kitchen aspirin EC 81 MG tablet Take 81 mg by mouth daily.    Marland Kitchen atorvastatin (LIPITOR) 40 MG tablet Take 1 tablet (40 mg total) by mouth every evening. For cholesterol. 90 tablet 1  . citalopram (CELEXA) 40 MG tablet Take 40 mg by mouth daily.    Marland Kitchen lisinopril (ZESTRIL) 20 MG tablet Take 20 mg by mouth daily.     . metFORMIN (GLUCOPHAGE) 500 MG tablet Take 1 tablet (500 mg total) by mouth 2 (two) times daily with a meal. For diabetes. 180 tablet 1  . Multiple  Vitamins-Minerals (MULTIVITAMIN ADULT PO) Take by mouth.    . prazosin (MINIPRESS) 1 MG capsule Take 1 mg by mouth at bedtime.    . SYMBICORT 160-4.5 MCG/ACT inhaler Inhale 2 puffs into the lungs 2 (two) times daily.      No current facility-administered medications on file prior to visit.    BP 128/66   Pulse 82   Temp (!) 97.2 F (36.2 C) (Temporal)   Ht 5\' 2"  (1.575 m)   Wt 162 lb 8 oz (73.7 kg)   SpO2 94%   BMI 29.72 kg/m    Objective:   Physical Exam  Constitutional: He appears well-nourished.  Cardiovascular: Normal rate and regular rhythm.  Respiratory: Effort normal and breath sounds normal.  Musculoskeletal:     Cervical back: Neck supple.  Skin: Skin is warm and dry.  Psychiatric: He has a normal mood and affect.           Assessment & Plan:

## 2019-10-08 NOTE — Assessment & Plan Note (Signed)
A1C of 6.5 today which has improved with metformin use.  Continue Metformin 500 mg BID. Foot exam today. Managed on statin and ACE. Declines pneumonia vaccine.  Follow up in 6 months.

## 2019-10-08 NOTE — Assessment & Plan Note (Signed)
Following with vascular surgery, due for follow up in Fall 2021. CT angio neck reviewed. Repeat LDL pending. Compliant to atorvastatin.

## 2019-10-08 NOTE — Patient Instructions (Signed)
Your blood sugar is under good control with metformin, continue taking this twice daily.  Please schedule a physical with me for 6 months. You may also schedule a lab only appointment 3-4 days prior. We will discuss your lab results in detail during your physical.  It was a pleasure to see you today!   Diabetes Mellitus and Nutrition, Adult When you have diabetes (diabetes mellitus), it is very important to have healthy eating habits because your blood sugar (glucose) levels are greatly affected by what you eat and drink. Eating healthy foods in the appropriate amounts, at about the same times every day, can help you:  Control your blood glucose.  Lower your risk of heart disease.  Improve your blood pressure.  Reach or maintain a healthy weight. Every person with diabetes is different, and each person has different needs for a meal plan. Your health care provider may recommend that you work with a diet and nutrition specialist (dietitian) to make a meal plan that is best for you. Your meal plan may vary depending on factors such as:  The calories you need.  The medicines you take.  Your weight.  Your blood glucose, blood pressure, and cholesterol levels.  Your activity level.  Other health conditions you have, such as heart or kidney disease. How do carbohydrates affect me? Carbohydrates, also called carbs, affect your blood glucose level more than any other type of food. Eating carbs naturally raises the amount of glucose in your blood. Carb counting is a method for keeping track of how many carbs you eat. Counting carbs is important to keep your blood glucose at a healthy level, especially if you use insulin or take certain oral diabetes medicines. It is important to know how many carbs you can safely have in each meal. This is different for every person. Your dietitian can help you calculate how many carbs you should have at each meal and for each snack. Foods that contain carbs  include:  Bread, cereal, rice, pasta, and crackers.  Potatoes and corn.  Peas, beans, and lentils.  Milk and yogurt.  Fruit and juice.  Desserts, such as cakes, cookies, ice cream, and candy. How does alcohol affect me? Alcohol can cause a sudden decrease in blood glucose (hypoglycemia), especially if you use insulin or take certain oral diabetes medicines. Hypoglycemia can be a life-threatening condition. Symptoms of hypoglycemia (sleepiness, dizziness, and confusion) are similar to symptoms of having too much alcohol. If your health care provider says that alcohol is safe for you, follow these guidelines:  Limit alcohol intake to no more than 1 drink per day for nonpregnant women and 2 drinks per day for men. One drink equals 12 oz of beer, 5 oz of wine, or 1 oz of hard liquor.  Do not drink on an empty stomach.  Keep yourself hydrated with water, diet soda, or unsweetened iced tea.  Keep in mind that regular soda, juice, and other mixers may contain a lot of sugar and must be counted as carbs. What are tips for following this plan?  Reading food labels  Start by checking the serving size on the "Nutrition Facts" label of packaged foods and drinks. The amount of calories, carbs, fats, and other nutrients listed on the label is based on one serving of the item. Many items contain more than one serving per package.  Check the total grams (g) of carbs in one serving. You can calculate the number of servings of carbs in one serving by  dividing the total carbs by 15. For example, if a food has 30 g of total carbs, it would be equal to 2 servings of carbs.  Check the number of grams (g) of saturated and trans fats in one serving. Choose foods that have low or no amount of these fats.  Check the number of milligrams (mg) of salt (sodium) in one serving. Most people should limit total sodium intake to less than 2,300 mg per day.  Always check the nutrition information of foods labeled  as "low-fat" or "nonfat". These foods may be higher in added sugar or refined carbs and should be avoided.  Talk to your dietitian to identify your daily goals for nutrients listed on the label. Shopping  Avoid buying canned, premade, or processed foods. These foods tend to be high in fat, sodium, and added sugar.  Shop around the outside edge of the grocery store. This includes fresh fruits and vegetables, bulk grains, fresh meats, and fresh dairy. Cooking  Use low-heat cooking methods, such as baking, instead of high-heat cooking methods like deep frying.  Cook using healthy oils, such as olive, canola, or sunflower oil.  Avoid cooking with butter, cream, or high-fat meats. Meal planning  Eat meals and snacks regularly, preferably at the same times every day. Avoid going long periods of time without eating.  Eat foods high in fiber, such as fresh fruits, vegetables, beans, and whole grains. Talk to your dietitian about how many servings of carbs you can eat at each meal.  Eat 4-6 ounces (oz) of lean protein each day, such as lean meat, chicken, fish, eggs, or tofu. One oz of lean protein is equal to: ? 1 oz of meat, chicken, or fish. ? 1 egg. ?  cup of tofu.  Eat some foods each day that contain healthy fats, such as avocado, nuts, seeds, and fish. Lifestyle  Check your blood glucose regularly.  Exercise regularly as told by your health care provider. This may include: ? 150 minutes of moderate-intensity or vigorous-intensity exercise each week. This could be brisk walking, biking, or water aerobics. ? Stretching and doing strength exercises, such as yoga or weightlifting, at least 2 times a week.  Take medicines as told by your health care provider.  Do not use any products that contain nicotine or tobacco, such as cigarettes and e-cigarettes. If you need help quitting, ask your health care provider.  Work with a Social worker or diabetes educator to identify strategies to  manage stress and any emotional and social challenges. Questions to ask a health care provider  Do I need to meet with a diabetes educator?  Do I need to meet with a dietitian?  What number can I call if I have questions?  When are the best times to check my blood glucose? Where to find more information:  American Diabetes Association: diabetes.org  Academy of Nutrition and Dietetics: www.eatright.[employer] of Diabetes and Digestive and Kidney Diseases (NIH): [employer] Summary  A healthy meal plan will help you control your blood glucose and maintain a healthy lifestyle.  Working with a diet and nutrition specialist (dietitian) can help you make a meal plan that is best for you.  Keep in mind that carbohydrates (carbs) and alcohol have immediate effects on your blood glucose levels. It is important to count carbs and to use alcohol carefully. This information is not intended to replace advice given to you by your health care provider. Make sure you discuss any questions you have  with your health care provider. Document Revised: 08/12/2017 Document Reviewed: 10/04/2016 Elsevier Patient Education  2020 [employer].

## 2019-12-08 ENCOUNTER — Other Ambulatory Visit: Payer: Self-pay | Admitting: Primary Care

## 2019-12-08 DIAGNOSIS — E119 Type 2 diabetes mellitus without complications: Secondary | ICD-10-CM

## 2019-12-08 DIAGNOSIS — E785 Hyperlipidemia, unspecified: Secondary | ICD-10-CM

## 2019-12-11 ENCOUNTER — Telehealth: Payer: Self-pay | Admitting: Primary Care

## 2019-12-11 DIAGNOSIS — E119 Type 2 diabetes mellitus without complications: Secondary | ICD-10-CM

## 2019-12-11 DIAGNOSIS — I10 Essential (primary) hypertension: Secondary | ICD-10-CM

## 2019-12-11 DIAGNOSIS — E785 Hyperlipidemia, unspecified: Secondary | ICD-10-CM

## 2019-12-11 DIAGNOSIS — F419 Anxiety disorder, unspecified: Secondary | ICD-10-CM

## 2019-12-11 DIAGNOSIS — F329 Major depressive disorder, single episode, unspecified: Secondary | ICD-10-CM

## 2019-12-11 MED ORDER — METFORMIN HCL 500 MG PO TABS: ORAL_TABLET | ORAL | 1 refills | Status: DC

## 2019-12-11 MED ORDER — LISINOPRIL 20 MG PO TABS: 20.0000 mg | ORAL_TABLET | Freq: Every day | ORAL | 3 refills | Status: DC

## 2019-12-11 MED ORDER — CITALOPRAM HYDROBROMIDE 40 MG PO TABS: 40.0000 mg | ORAL_TABLET | Freq: Every day | ORAL | 3 refills | Status: DC

## 2019-12-11 MED ORDER — ATORVASTATIN CALCIUM 40 MG PO TABS: ORAL_TABLET | ORAL | 1 refills | Status: DC

## 2019-12-11 MED ORDER — AMLODIPINE BESYLATE 10 MG PO TABS: 10.0000 mg | ORAL_TABLET | Freq: Every day | ORAL | 3 refills | Status: DC

## 2019-12-11 NOTE — Telephone Encounter (Signed)
Not sure why epic sent that message to patient. However, need to fix the refill request as the wrong amount was dispense on 12/08/2019  Also patient ask if [facility] can refill the amlodipine 10 mg, citalopram 40 mg, and lisinopril 20 mg.  These have not been prescribed by [facility]. Last OV on 10/08/2019. Next appointment on 04/08/2020

## 2019-12-11 NOTE — Telephone Encounter (Signed)
Pt called stating he received my chart message that one of his medications has changed.  He stated the message didn't list medication name.  Please call pt back with meds changs.  It also stated pt had talked to kate on  3/27 Saturday    Pt state he didn't  Talk kate   Please advise

## 2019-12-11 NOTE — Telephone Encounter (Signed)
Noted, refills sent to pharmacy. 

## 2019-12-27 IMAGING — CT CT CHEST LUNG CANCER SCREENING LOW DOSE W/O CM
2 of 5 series · 15 of 40 positions shown, 18 images · non-contrast
Comparison: No priors.

CLINICAL DATA: 58-year-old male current smoker with 40 pack-year
history of smoking. Lung cancer screening examination.

EXAM:
CT CHEST WITHOUT CONTRAST LOW-DOSE FOR LUNG CANCER SCREENING
TECHNIQUE: Multidetector CT imaging of the chest was performed following the
standard protocol without IV contrast.

[Series 3: lung 1.00 · axial · 0.68mm/px · z∈[-1225,-896]mm · 12 of 365 slices shown, 15 images]
[im 18/365  mediastinal]
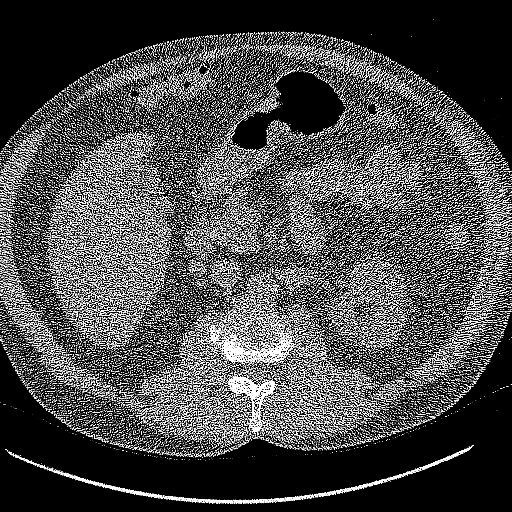
[im 18/365  lung]
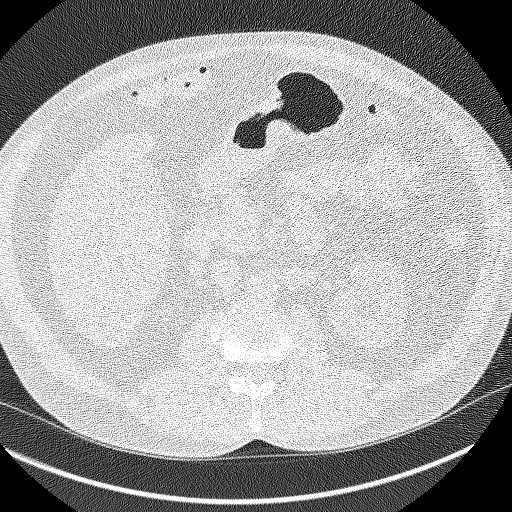
[im 53/365  lung]
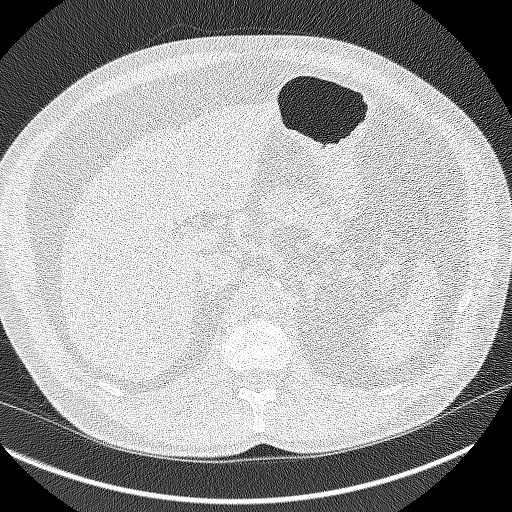
[im 87/365  lung]
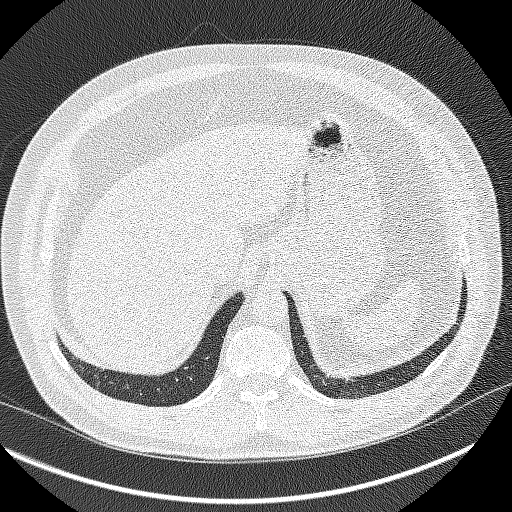
[im 105/365  lung]
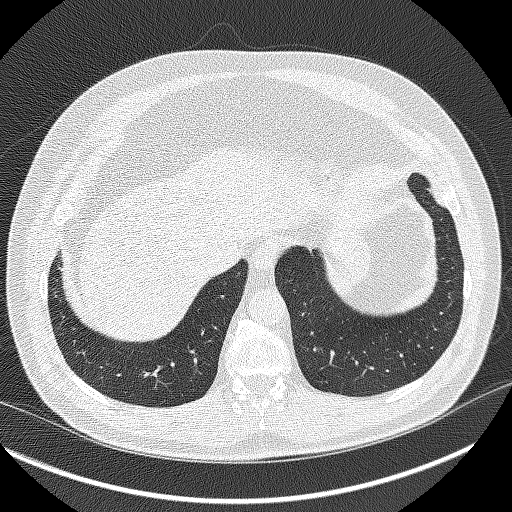
[im 139/365  mediastinal]
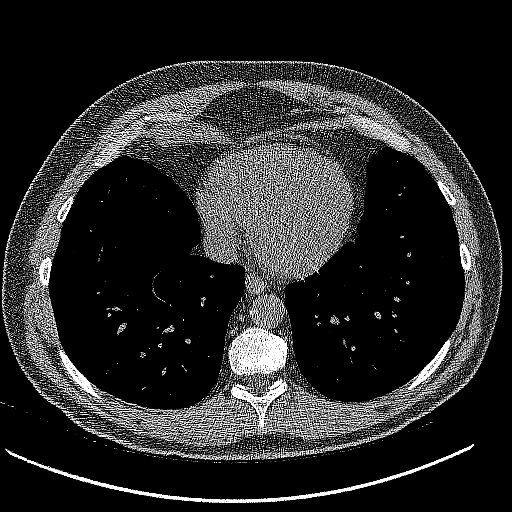
[im 139/365  lung]
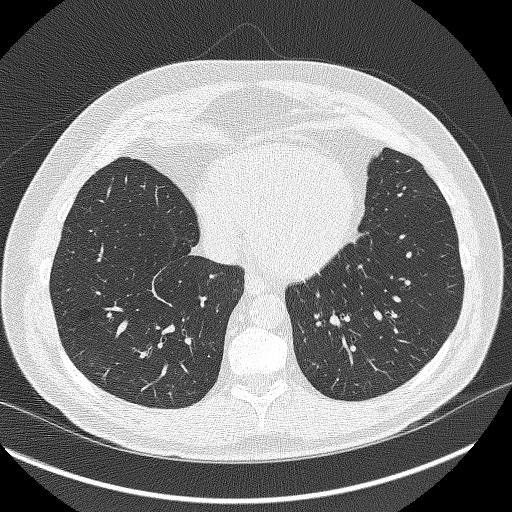
[im 174/365  lung]
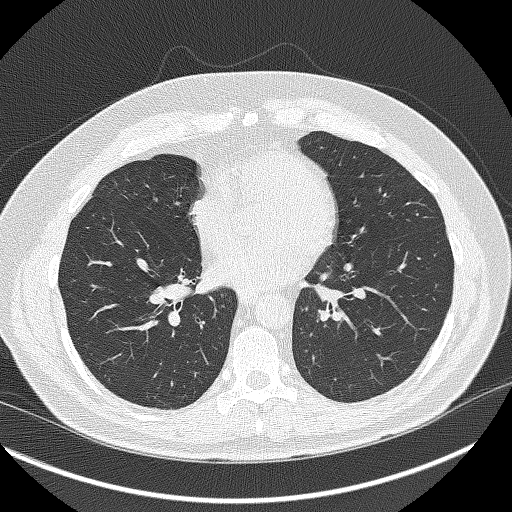
[im 191/365  lung]
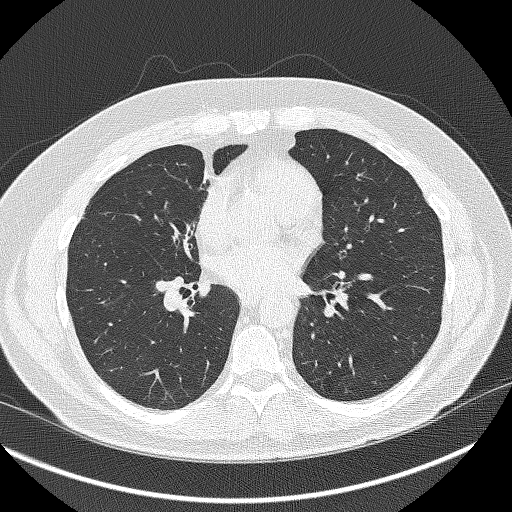
[im 226/365  lung]
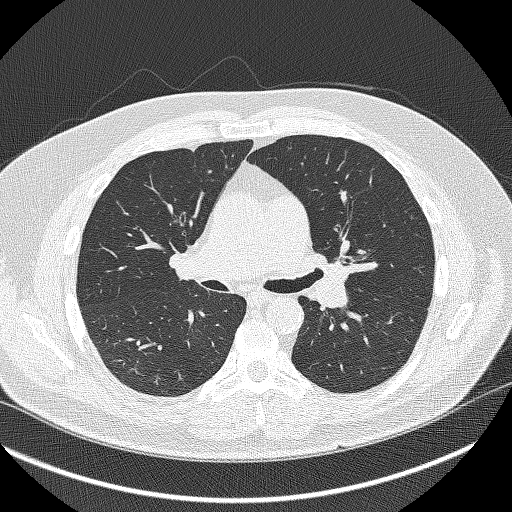
[im 261/365  mediastinal]
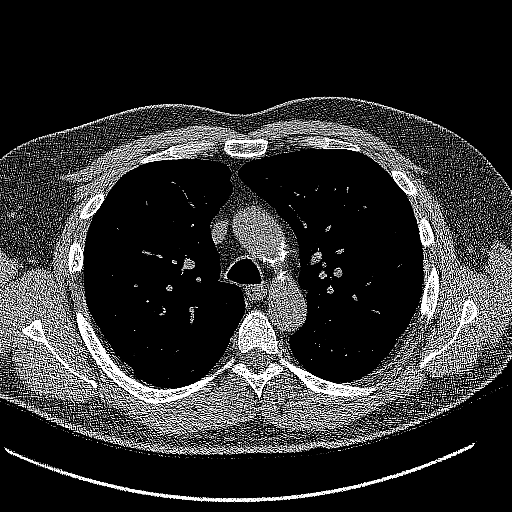
[im 261/365  lung]
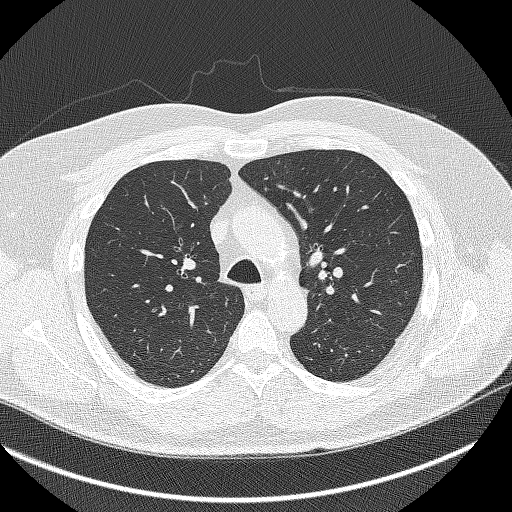
[im 278/365  lung]
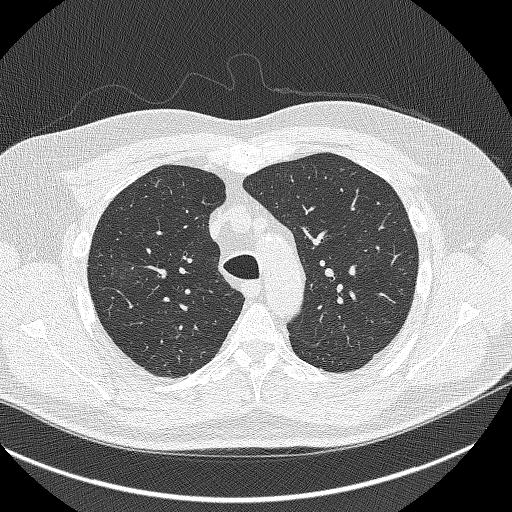
[im 313/365  lung]
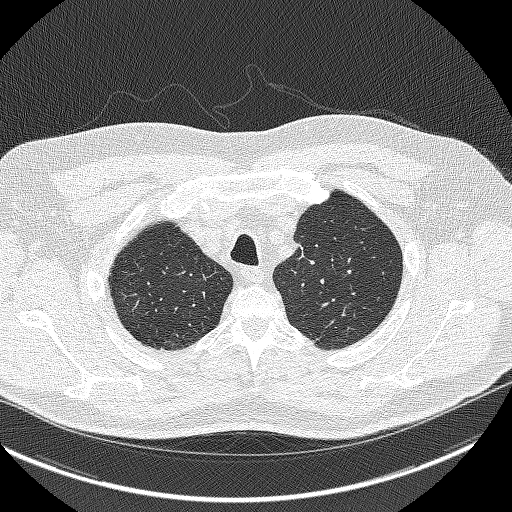
[im 347/365  lung]
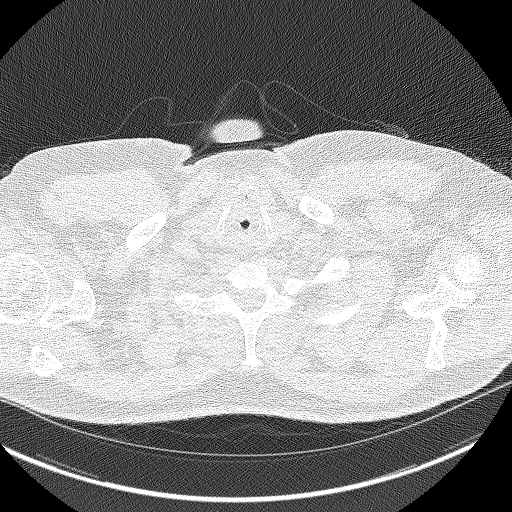

[Series 4: coronals lung 1.00 cor · coronal · 0.68mm/px · 3 of 293 slices shown]
[im 59/293  lung]
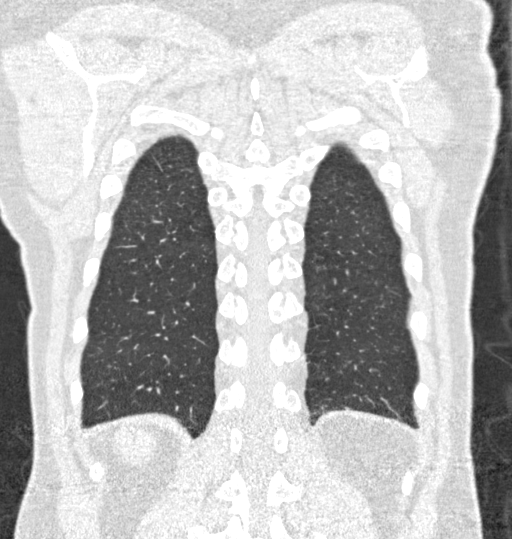
[im 117/293  lung]
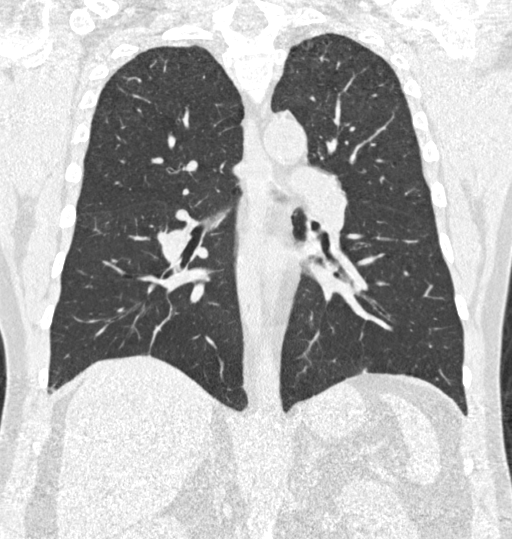
[im 176/293  lung]
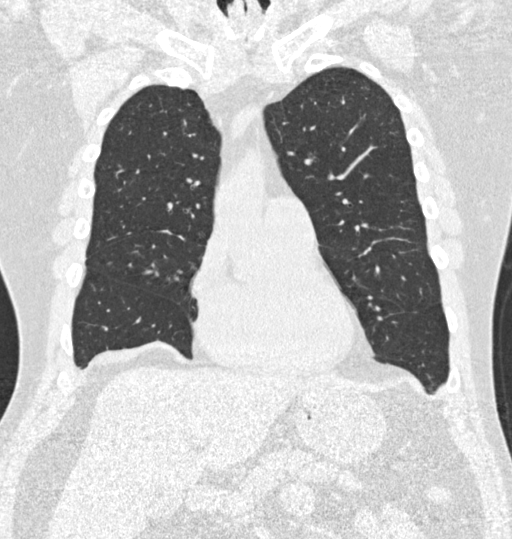

[15 of 40 positions shown; findings below may reference images not displayed]

FINDINGS: Cardiovascular: Heart size is normal. There is no significant
pericardial fluid, thickening or pericardial calcification. There is
aortic atherosclerosis, as well as atherosclerosis of the great
vessels of the mediastinum and the coronary arteries, including
calcified atherosclerotic plaque in the left main, left anterior
descending and right coronary arteries.

Mediastinum/Nodes: No pathologically enlarged mediastinal or hilar
lymph nodes. Please note that accurate exclusion of hilar adenopathy
is limited on noncontrast CT scans. Esophagus is unremarkable in
appearance. No axillary lymphadenopathy.

Lungs/Pleura: Small pulmonary nodules are noted in the lungs
bilaterally, largest of which is a non solid nodule in the right
upper lobe near the apex (axial image 43 of series 3), with a volume
derived mean diameter 3.5 mm. No larger more suspicious appearing
pulmonary nodules or masses are noted. No acute consolidative
airspace disease. No pleural effusions. Mild diffuse bronchial wall
thickening with very mild centrilobular and paraseptal emphysema.

Upper Abdomen: Calcified gallstones in the gallbladder. Aortic
atherosclerosis.

Musculoskeletal: There are no aggressive appearing lytic or blastic
lesions noted in the visualized portions of the skeleton.
IMPRESSION: 1. Lung-RADS 2S, benign appearance or behavior. Continue annual
screening with low-dose chest CT without contrast in 12 months.
2. The "S" modifier above refers to potentially clinically
significant non lung cancer related findings. Specifically, there is
aortic atherosclerosis, in addition to left main and 2 vessel
coronary artery disease. Please note that although the presence of
coronary artery calcium documents the presence of coronary artery
disease, the severity of this disease and any potential stenosis
cannot be assessed on this non-gated CT examination. Assessment for
potential risk factor modification, dietary therapy or pharmacologic
therapy may be warranted, if clinically indicated.
3. Mild diffuse bronchial wall thickening with very mild
centrilobular and paraseptal emphysema; imaging findings suggestive
of underlying COPD.
4. Cholelithiasis.

Aortic Atherosclerosis (CQTVP-WG1.1) and Emphysema (CQTVP-TDC.K).

## 2020-03-20 ENCOUNTER — Other Ambulatory Visit: Payer: Self-pay | Admitting: Primary Care

## 2020-03-20 DIAGNOSIS — Z1159 Encounter for screening for other viral diseases: Secondary | ICD-10-CM

## 2020-03-20 DIAGNOSIS — I10 Essential (primary) hypertension: Secondary | ICD-10-CM

## 2020-03-20 DIAGNOSIS — E119 Type 2 diabetes mellitus without complications: Secondary | ICD-10-CM

## 2020-03-20 DIAGNOSIS — E785 Hyperlipidemia, unspecified: Secondary | ICD-10-CM

## 2020-03-20 DIAGNOSIS — Z125 Encounter for screening for malignant neoplasm of prostate: Secondary | ICD-10-CM

## 2020-03-20 DIAGNOSIS — Z114 Encounter for screening for human immunodeficiency virus [HIV]: Secondary | ICD-10-CM

## 2020-04-01 ENCOUNTER — Other Ambulatory Visit: Payer: Self-pay

## 2020-04-01 ENCOUNTER — Ambulatory Visit: Payer: Self-pay

## 2020-04-08 ENCOUNTER — Encounter: Payer: Self-pay | Admitting: Primary Care

## 2020-04-30 ENCOUNTER — Other Ambulatory Visit: Payer: Self-pay

## 2020-04-30 ENCOUNTER — Other Ambulatory Visit (INDEPENDENT_AMBULATORY_CARE_PROVIDER_SITE_OTHER): Payer: 59

## 2020-04-30 DIAGNOSIS — I10 Essential (primary) hypertension: Secondary | ICD-10-CM | POA: Diagnosis not present

## 2020-04-30 DIAGNOSIS — E119 Type 2 diabetes mellitus without complications: Secondary | ICD-10-CM | POA: Diagnosis not present

## 2020-04-30 DIAGNOSIS — Z125 Encounter for screening for malignant neoplasm of prostate: Secondary | ICD-10-CM | POA: Diagnosis not present

## 2020-04-30 DIAGNOSIS — E785 Hyperlipidemia, unspecified: Secondary | ICD-10-CM

## 2020-04-30 DIAGNOSIS — Z1159 Encounter for screening for other viral diseases: Secondary | ICD-10-CM

## 2020-04-30 DIAGNOSIS — Z114 Encounter for screening for human immunodeficiency virus [HIV]: Secondary | ICD-10-CM

## 2020-04-30 LAB — CBC
HCT: 44.1 % (ref 39.0–52.0)
Hemoglobin: 14.9 g/dL (ref 13.0–17.0)
MCHC: 33.7 g/dL (ref 30.0–36.0)
MCV: 92.8 fl (ref 78.0–100.0)
Platelets: 207 10*3/uL (ref 150.0–400.0)
RBC: 4.76 Mil/uL (ref 4.22–5.81)
RDW: 13.9 % (ref 11.5–15.5)
WBC: 10.9 10*3/uL — ABNORMAL HIGH (ref 4.0–10.5)

## 2020-04-30 LAB — PSA: PSA: 0.28 ng/mL (ref 0.10–4.00)

## 2020-04-30 LAB — LIPID PANEL
Cholesterol: 107 mg/dL (ref 0–200)
HDL: 40.4 mg/dL (ref >39.00)
LDL Cholesterol: 38 mg/dL (ref 0–99)
NonHDL: 66.76
Total CHOL/HDL Ratio: 3
Triglycerides: 145 mg/dL (ref 0.0–149.0)
VLDL: 29 mg/dL (ref 0.0–40.0)

## 2020-04-30 LAB — COMPREHENSIVE METABOLIC PANEL
ALT: 36 U/L (ref 0–53)
AST: 24 U/L (ref 0–37)
Albumin: 4.5 g/dL (ref 3.5–5.2)
Alkaline Phosphatase: 63 U/L (ref 39–117)
BUN: 13 mg/dL (ref 6–23)
CO2: 32 mEq/L (ref 19–32)
Calcium: 9.3 mg/dL (ref 8.4–10.5)
Chloride: 101 mEq/L (ref 96–112)
Creatinine, Ser: 0.92 mg/dL (ref 0.40–1.50)
GFR: 83.67 mL/min (ref >60.00)
Glucose, Bld: 118 mg/dL — ABNORMAL HIGH (ref 70–99)
Potassium: 4.4 mEq/L (ref 3.5–5.1)
Sodium: 141 mEq/L (ref 135–145)
Total Bilirubin: 0.7 mg/dL (ref 0.2–1.2)
Total Protein: 6.7 g/dL (ref 6.0–8.3)

## 2020-04-30 LAB — HEMOGLOBIN A1C: Hgb A1c MFr Bld: 6.5 % (ref 4.6–6.5)

## 2020-05-01 LAB — HEPATITIS C ANTIBODY
Hepatitis C Ab: NONREACTIVE
SIGNAL TO CUT-OFF: 0.01 (ref <1.00)

## 2020-05-01 LAB — HIV ANTIBODY (ROUTINE TESTING W REFLEX): HIV 1&2 Ab, 4th Generation: NONREACTIVE

## 2020-05-07 ENCOUNTER — Ambulatory Visit: Payer: 59 | Admitting: Primary Care

## 2020-05-07 ENCOUNTER — Encounter: Payer: Self-pay | Admitting: Primary Care

## 2020-05-07 ENCOUNTER — Other Ambulatory Visit: Payer: Self-pay

## 2020-05-07 ENCOUNTER — Ambulatory Visit (INDEPENDENT_AMBULATORY_CARE_PROVIDER_SITE_OTHER)
Admission: RE | Admit: 2020-05-07 | Discharge: 2020-05-07 | Disposition: A | Discharge status: Home / Self Care | Payer: 59 | Source: Ambulatory Visit | Attending: Primary Care | Admitting: Primary Care

## 2020-05-07 ENCOUNTER — Telehealth: Payer: Self-pay

## 2020-05-07 VITALS — BP 124/84 | HR 88 | Temp 95.5°F | Ht 62.0 in | Wt 155.5 lb

## 2020-05-07 DIAGNOSIS — J449 Chronic obstructive pulmonary disease, unspecified: Secondary | ICD-10-CM | POA: Diagnosis not present

## 2020-05-07 DIAGNOSIS — R059 Cough, unspecified: Secondary | ICD-10-CM

## 2020-05-07 DIAGNOSIS — R05 Cough: Secondary | ICD-10-CM | POA: Diagnosis not present

## 2020-05-07 DIAGNOSIS — R0902 Hypoxemia: Secondary | ICD-10-CM | POA: Insufficient documentation

## 2020-05-07 HISTORY — DX: Hypoxemia: R09.02

## 2020-05-07 MED ORDER — METHYLPREDNISOLONE ACETATE 80 MG/ML IJ SUSP
80.0000 mg | Freq: Once | INTRAMUSCULAR | Status: AC
Start: 2020-05-07 — End: 2020-05-07
  Administered 2020-05-07: 80 mg via INTRAMUSCULAR

## 2020-05-07 MED ORDER — PREDNISONE 20 MG PO TABS: ORAL_TABLET | ORAL | 0 refills | Status: DC

## 2020-05-07 MED ORDER — ALBUTEROL SULFATE HFA 108 (90 BASE) MCG/ACT IN AERS: 1.0000 | INHALATION_SPRAY | RESPIRATORY_TRACT | 0 refills | Status: DC | PRN

## 2020-05-07 MED ORDER — DOXYCYCLINE HYCLATE 100 MG PO TABS: 100.0000 mg | ORAL_TABLET | Freq: Two times a day (BID) | ORAL | 0 refills | Status: DC

## 2020-05-07 MED ORDER — CEFTRIAXONE SODIUM 250 MG IJ SOLR
250.0000 mg | Freq: Once | INTRAMUSCULAR | Status: AC
Start: 2020-05-07 — End: 2020-05-07
  Administered 2020-05-07: 250 mg via INTRAMUSCULAR

## 2020-05-07 MED ORDER — SYMBICORT 160-4.5 MCG/ACT IN AERO: 2.0000 | INHALATION_SPRAY | Freq: Two times a day (BID) | RESPIRATORY_TRACT | 11 refills | Status: DC

## 2020-05-07 NOTE — Patient Instructions (Signed)
I recommend that you go to the hospital now! Your oxygen levels are too low.  Start Doxycycline antibiotic for the infection. Take 1 tablet by mouth twice daily for 10 days.  Start prednisone. Take 2 tablets by mouth once daily for five days.  Shortness of Breath/Wheezing/Cough: Use the albuterol inhaler. Inhale 2 puffs into the lungs every 4 to 6 hours as needed for wheezing, cough, and/or shortness of breath.   Stop by the front desk and speak with either Ashtyn or Brooke regarding your CT chest.  It was a pleasure to see you today!

## 2020-05-07 NOTE — Telephone Encounter (Signed)
Rx corrected and resent to pharmacy. 

## 2020-05-07 NOTE — Assessment & Plan Note (Addendum)
Acute symptoms of cough and congestion x 5 days. Not vaccinated from Covid-19, does have COPD history.  Hypoxic on room air with saturations between 84-90%. Improved very slightly to 90-93% with 2 liters of oxygen. Tachypnea and audible rhonchi on exam. Rhonchi throughout entire lung fields on exam, diminished to left lower.  Differentials include COPD exacerbation, Covid-19, PE, pneumonia. I strongly advised he allow us to call paramedics and/or proceed to the nearest emergency department, he refuses. We discussed that he could go into respiratory failure and expire if not evaluated and treated, he verbalized understanding.  Given that he refuses to see emergency treatment we will obtain labs (lactic acid, CBC with diff, d-dimer, CMP) and stat chest xray.   IM steroids and Rocephin provided in the office today.  Chest xray today with evidence of bronchitis, potential nodule to LLL that was not evident on CT chest from November 2020.  Stat CT chest ordered and pending.  Again, strongly advised he seek immediate attention at the emergency department. 

## 2020-05-07 NOTE — Assessment & Plan Note (Signed)
Acute symptoms of cough and congestion x 5 days. Not vaccinated from Covid-19, does have COPD history.  Hypoxic on room air with saturations between 84-90%. Improved very slightly to 90-93% with 2 liters of oxygen. Tachypnea and audible rhonchi on exam. Rhonchi throughout entire lung fields on exam, diminished to left lower.  Differentials include COPD exacerbation, Covid-19, PE, pneumonia. I strongly advised he allow [place] to call paramedics and/or proceed to the nearest emergency department, he refuses. We discussed that he could go into respiratory failure and expire if not evaluated and treated, he verbalized understanding.  Given that he refuses to see emergency treatment we will obtain labs (lactic acid, CBC with diff, d-dimer, CMP) and stat chest xray.   IM steroids and Rocephin provided in the office today.  Chest xray today with evidence of bronchitis, potential nodule to LLL that was not evident on CT chest from November 2020.  Stat CT chest ordered and pending.  Again, strongly advised he seek immediate attention at the emergency department.

## 2020-05-07 NOTE — Telephone Encounter (Signed)
Karin Golden of [employer] (no persons name left) left v/m requesting clarification of prednisone 20 mg that was sent in today with instructions to take 2 tabs bid for 5 days but qty was # 10. Please cb with different qty or different instructions.

## 2020-05-07 NOTE — Progress Notes (Signed)
Subjective:    Patient ID: Roy Richards, male    DOB: 1961/01/30, 59 y.o.   MRN: [id number]  HPI  This visit occurred during the SARS-CoV-2 public health emergency.  Safety protocols were in place, including screening questions prior to the visit, additional usage of staff PPE, and extensive cleaning of exam room while observing appropriate contact time as indicated for disinfecting solutions.   Roy Richards is a 59 year old male with a history of atherosclerosis, hypertension, renal artery stenosis, diabetes, COPD, tobacco abuse who presents today for his physical.   He checked in our front desk with reports of a four day history of cough. During check in our CMA noted he was hypoxic on room air with readings of 84%. We applied 2 liters of supplemental oxygen and he increased to 91%.   Today he endorses chest congestion that began five days ago, started coughing up clear sputum at the time. He's noticed increased shortness of breath with minimal and moderate exertion, and rest including carrying in groceries, walking to the bathroom, sitting in our exam room. He was worn out after walking from the front door to the exam room.  He states that he gets a "cold" once a year, same symptoms, which take about three weeks to resolve. He denies known exposure to Covid-19. Prior to symptoms beginning [place]. He has not been vaccinated against Covid-19.  BP Readings from Last 3 Encounters:  05/07/20 124/84  10/08/19 128/66  08/29/19 (!) 142/82     Review of Systems  Constitutional: Positive for fatigue. Negative for chills and fever.  HENT: Positive for congestion.        Denies loss of taste/smell  Respiratory: Positive for cough, chest tightness and shortness of breath.   Cardiovascular: Negative for chest pain.  Gastrointestinal: Negative for diarrhea and nausea.       Past Medical History:  Diagnosis Date  . Acute colitis  06/26/2019  . Diabetes mellitus without complication (HCC)   . Hyperlipidemia   . Hypertension      Social History   Socioeconomic History  . Marital status: Single    Spouse name: Not on file  . Number of children: Not on file  . Years of education: Not on file  . Highest education level: Not on file  Occupational History  . Not on file  Tobacco Use  . Smoking status: Current Some Day Smoker    Types: Cigarettes  . Smokeless tobacco: Never Used  Substance and Sexual Activity  . Alcohol use: Yes    Comment: occasionally  . Drug use: Never  . Sexual activity: Yes  Other Topics Concern  . Not on file  Social History Narrative  . Not on file   Social Determinants of Health   Financial Resource Strain:   . Difficulty of Paying Living Expenses: Not on file  Food Insecurity:   . Worried About Programme researcher, broadcasting/film/video in the Last Year: Not on file  . Ran Out of Food in the Last Year: Not on file  Transportation Needs:   . Lack of Transportation (Medical): Not on file  . Lack of Transportation (Non-Medical): Not on file  Physical Activity:   . Days of Exercise per Week: Not on file  . Minutes of Exercise per Session: Not on file  Stress:   . Feeling of Stress : Not on file  Social Connections:   . Frequency of  Communication with Friends and Family: Not on file  . Frequency of Social Gatherings with Friends and Family: Not on file  . Attends Religious Services: Not on file  . Active Member of Clubs or Organizations: Not on file  . Attends Banker Meetings: Not on file  . Marital Status: Not on file  Intimate Partner Violence:   . Fear of Current or Ex-Partner: Not on file  . Emotionally Abused: Not on file  . Physically Abused: Not on file  . Sexually Abused: Not on file    Past Surgical History:  Procedure Laterality Date  . HERNIA REPAIR      Family History  Problem Relation Age of Onset  . COPD Mother   . Heart disease Father   . Hyperlipidemia  Father   . Hypertension Father     No Known Allergies  Current Outpatient Medications on File Prior to Visit  Medication Sig Dispense Refill  . albuterol (VENTOLIN HFA) 108 (90 Base) MCG/ACT inhaler Inhale 1-2 puffs into the lungs every 6 (six) hours as needed.     Marland Kitchen amLODipine (NORVASC) 10 MG tablet Take 1 tablet (10 mg total) by mouth daily. For blood pressure. 90 tablet 3  . aspirin EC 81 MG tablet Take 81 mg by mouth daily.    Marland Kitchen atorvastatin (LIPITOR) 40 MG tablet TAKE ONE TABLET BY MOUTH EVERY EVENING FOR CHOLESTEROL 90 tablet 1  . citalopram (CELEXA) 40 MG tablet Take 1 tablet (40 mg total) by mouth daily. For anxiety. 90 tablet 3  . lisinopril (ZESTRIL) 20 MG tablet Take 1 tablet (20 mg total) by mouth daily. For blood pressure. 90 tablet 3  . metFORMIN (GLUCOPHAGE) 500 MG tablet TAKE ONE TABLET BY MOUTH TWICE A DAY WITH MEALS FOR DIABETES 180 tablet 1  . Multiple Vitamins-Minerals (MULTIVITAMIN ADULT PO) Take by mouth.    . prazosin (MINIPRESS) 1 MG capsule Take 1 mg by mouth at bedtime.    . SYMBICORT 160-4.5 MCG/ACT inhaler Inhale 2 puffs into the lungs 2 (two) times daily.      No current facility-administered medications on file prior to visit.    BP 124/84   Pulse 88   Temp (!) 95.5 F (35.3 C) (Temporal)   Ht 5\' 2"  (1.575 m)   Wt 155 lb 8 oz (70.5 kg)   SpO2 (!) 84%   BMI 28.44 kg/m    Objective:   Physical Exam Pulmonary:     Effort: Tachypnea present.     Breath sounds: Examination of the right-upper field reveals rhonchi. Examination of the left-upper field reveals rhonchi. Examination of the right-middle field reveals rhonchi. Examination of the right-lower field reveals rhonchi. Examination of the left-lower field reveals decreased breath sounds and rhonchi. Decreased breath sounds and rhonchi present.  Neurological:     Mental Status: He is alert.            Assessment & Plan:

## 2020-05-09 ENCOUNTER — Telehealth: Payer: Self-pay | Admitting: Primary Care

## 2020-05-09 LAB — LACTIC ACID, PLASMA: LACTIC ACID: 2.8 mmol/L — ABNORMAL HIGH (ref 0.4–1.8)

## 2020-05-09 LAB — CBC WITH DIFFERENTIAL/PLATELET
Absolute Monocytes: 1227 cells/uL — ABNORMAL HIGH (ref 200–950)
Basophils Absolute: 35 cells/uL (ref 0–200)
Basophils Relative: 0.3 %
Eosinophils Absolute: 35 cells/uL (ref 15–500)
Eosinophils Relative: 0.3 %
HCT: 47.3 % (ref 38.5–50.0)
Hemoglobin: 15.7 g/dL (ref 13.2–17.1)
Lymphs Abs: 1558 cells/uL (ref 850–3900)
MCH: 30.8 pg (ref 27.0–33.0)
MCHC: 33.2 g/dL (ref 32.0–36.0)
MCV: 92.9 fL (ref 80.0–100.0)
MPV: 9.7 fL (ref 7.5–12.5)
Monocytes Relative: 10.4 %
Neutro Abs: 8944 cells/uL — ABNORMAL HIGH (ref 1500–7800)
Neutrophils Relative %: 75.8 %
Platelets: 197 10*3/uL (ref 140–400)
RBC: 5.09 10*6/uL (ref 4.20–5.80)
RDW: 13 % (ref 11.0–15.0)
Total Lymphocyte: 13.2 %
WBC: 11.8 10*3/uL — ABNORMAL HIGH (ref 3.8–10.8)

## 2020-05-09 LAB — COMPREHENSIVE METABOLIC PANEL
ALT: 33 U/L (ref 9–46)
AST: 40 U/L — ABNORMAL HIGH (ref 10–35)
Albumin: <1.5 g/dL — ABNORMAL LOW (ref 3.6–5.1)
Alkaline phosphatase (APISO): 82 U/L (ref 35–144)
BUN: 15 mg/dL (ref 7–25)
CO2: 28 mmol/L (ref 20–32)
Calcium: 9.4 mg/dL (ref 8.6–10.3)
Chloride: 97 mmol/L — ABNORMAL LOW (ref 98–110)
Creat: 1.16 mg/dL (ref 0.70–1.25)
Glucose, Bld: 125 mg/dL — ABNORMAL HIGH (ref 65–99)
Potassium: 4 mmol/L (ref 3.5–5.3)
Sodium: 138 mmol/L (ref 135–146)
Total Bilirubin: 0.5 mg/dL (ref 0.2–1.2)
Total Protein: 7.5 g/dL (ref 6.1–8.1)

## 2020-05-09 LAB — D-DIMER, QUANTITATIVE: D-Dimer, Quant: 0.49 mcg/mL FEU (ref <0.50)

## 2020-05-09 NOTE — Telephone Encounter (Signed)
Noted, will follow up with patient in person.

## 2020-05-09 NOTE — Telephone Encounter (Signed)
-----   Message from Maisie Fus, [place] sent at 05/09/2020 12:19 PM EDT ----- Regarding: RE: CT Chest Yes, I called and left him a message to call back regarding his CT scan, I made him aware in the vmail that his CT was approved and I needed to know if he wanted to Proceed further.   He returned my call and states that he does not want to proceed at this time - he feels like the antibiotics and injections are working and his symptoms are resolving. Pt states that he wants to wait and see if this clears up. Pt states that he has a history of walking PNA that he usually gets Bi-Annually and he feels that this is what that it - he does not agree with the diagnosis of "new lung nodule / nodular density" - he states this is scar tissue from the current and previous PNA infections.  Please advise, thanks.    ----- Message ----- From: [name], NP Sent: 05/09/2020   9:26 AM EDT To: Maisie Fus, CMA Subject: RE: CT Chest                                   Thank you, did you touch base with the patient? If not then I will. Let me know. ----- Message ----- From: Maisie Fus, CMA Sent: 05/09/2020   8:36 AM EDT To: [name], NP Subject: CT Chest                                       Good morning, I wanted to let you know that I received approval from [facility] for the CT Chest W CM -- it was approved through 08/05/2020  FYI  -Nadara Eaton, CMA

## 2020-05-15 ENCOUNTER — Ambulatory Visit: Payer: 59 | Admitting: Primary Care

## 2020-06-06 ENCOUNTER — Ambulatory Visit: Payer: 59 | Attending: Internal Medicine

## 2020-06-06 DIAGNOSIS — Z23 Encounter for immunization: Secondary | ICD-10-CM

## 2020-06-06 NOTE — Progress Notes (Signed)
° °  Covid-19 Vaccination Clinic  Name:  Roy Richards    MRN: [id number] DOB: [birth year]  06/06/2020  [name] was observed post Covid-19 immunization for 15 minutes without incident. He was provided with Vaccine Information Sheet and instruction to access the V-Safe system.   [name] was instructed to call 911 with any severe reactions post vaccine:  Difficulty breathing   Swelling of face and throat   A fast heartbeat   A bad rash all over body   Dizziness and weakness   Immunizations Administered    Name Date Dose VIS Date Route   Pfizer COVID-19 Vaccine 06/06/2020 11:38 AM 0.3 mL 11/07/2018 Intramuscular   Manufacturer: [employer], [employer]   Lot: [id number]   NDC: [address]-0

## 2020-06-13 ENCOUNTER — Other Ambulatory Visit: Payer: Self-pay | Admitting: Primary Care

## 2020-06-13 DIAGNOSIS — E785 Hyperlipidemia, unspecified: Secondary | ICD-10-CM

## 2020-06-17 ENCOUNTER — Other Ambulatory Visit: Payer: Self-pay | Admitting: Primary Care

## 2020-06-17 DIAGNOSIS — J449 Chronic obstructive pulmonary disease, unspecified: Secondary | ICD-10-CM

## 2020-06-18 ENCOUNTER — Other Ambulatory Visit: Payer: Self-pay | Admitting: Primary Care

## 2020-06-18 DIAGNOSIS — E119 Type 2 diabetes mellitus without complications: Secondary | ICD-10-CM

## 2020-06-27 ENCOUNTER — Ambulatory Visit: Payer: 59 | Attending: Internal Medicine

## 2020-06-27 ENCOUNTER — Other Ambulatory Visit: Payer: Self-pay | Admitting: Internal Medicine

## 2020-06-27 DIAGNOSIS — Z23 Encounter for immunization: Secondary | ICD-10-CM

## 2020-06-27 NOTE — Progress Notes (Signed)
   Covid-19 Vaccination Clinic  Name:  Roy Richards    MRN: [id number] DOB: 17-Jan-1961  06/27/2020  Roy Richards was observed post Covid-19 immunization for 15 minutes without incident. He was provided with Vaccine Information Sheet and instruction to access the V-Safe system.   Roy Richards was instructed to call 911 with any severe reactions post vaccine: Marland Kitchen Difficulty breathing  . Swelling of face and throat  . A fast heartbeat  . A bad rash all over body  . Dizziness and weakness   Immunizations Administered    Name Date Dose VIS Date Route   Pfizer COVID-19 Vaccine 06/27/2020 11:46 AM 0.3 mL 11/07/2018 Intramuscular   Manufacturer: [employer], [employer]   Lot: [id number]   NDC: [address]-8

## 2020-07-22 ENCOUNTER — Other Ambulatory Visit: Payer: Self-pay | Admitting: Primary Care

## 2020-07-22 DIAGNOSIS — J449 Chronic obstructive pulmonary disease, unspecified: Secondary | ICD-10-CM

## 2020-07-28 ENCOUNTER — Encounter (INDEPENDENT_AMBULATORY_CARE_PROVIDER_SITE_OTHER): Payer: 59

## 2020-08-01 ENCOUNTER — Other Ambulatory Visit (INDEPENDENT_AMBULATORY_CARE_PROVIDER_SITE_OTHER): Payer: Self-pay | Admitting: Nurse Practitioner

## 2020-08-01 ENCOUNTER — Telehealth: Payer: Self-pay | Admitting: *Deleted

## 2020-08-01 ENCOUNTER — Encounter: Payer: Self-pay | Admitting: *Deleted

## 2020-08-01 DIAGNOSIS — I701 Atherosclerosis of renal artery: Secondary | ICD-10-CM

## 2020-08-01 DIAGNOSIS — I708 Atherosclerosis of other arteries: Secondary | ICD-10-CM

## 2020-08-01 DIAGNOSIS — I6523 Occlusion and stenosis of bilateral carotid arteries: Secondary | ICD-10-CM

## 2020-08-01 NOTE — Telephone Encounter (Signed)
Attempted to contact and schedule lung screening scan. Message left for patient to call back to schedule. 

## 2020-08-06 ENCOUNTER — Encounter (INDEPENDENT_AMBULATORY_CARE_PROVIDER_SITE_OTHER): Payer: 59

## 2020-08-06 ENCOUNTER — Ambulatory Visit (INDEPENDENT_AMBULATORY_CARE_PROVIDER_SITE_OTHER): Payer: 59 | Admitting: Nurse Practitioner

## 2020-08-30 ENCOUNTER — Other Ambulatory Visit: Payer: Self-pay | Admitting: Primary Care

## 2020-08-30 DIAGNOSIS — J449 Chronic obstructive pulmonary disease, unspecified: Secondary | ICD-10-CM

## 2020-09-30 ENCOUNTER — Other Ambulatory Visit: Payer: Self-pay | Admitting: Primary Care

## 2020-09-30 DIAGNOSIS — I10 Essential (primary) hypertension: Secondary | ICD-10-CM

## 2020-09-30 DIAGNOSIS — E119 Type 2 diabetes mellitus without complications: Secondary | ICD-10-CM

## 2020-09-30 DIAGNOSIS — E785 Hyperlipidemia, unspecified: Secondary | ICD-10-CM

## 2020-10-01 NOTE — Telephone Encounter (Signed)
Patient is overdue for general follow up and medication refills. I will provide him with 30 day supply of each medication until he can get in. He must be seen for further refills.

## 2020-10-01 NOTE — Telephone Encounter (Signed)
Pt left v/m requesting cb about refills requested from pharmacy. Sending note to [facility] CMA.

## 2020-10-08 NOTE — Telephone Encounter (Signed)
Called patient and schedule OV for medication on 1/28

## 2020-10-10 ENCOUNTER — Ambulatory Visit: Payer: 59 | Admitting: Primary Care

## 2020-10-10 ENCOUNTER — Encounter: Payer: Self-pay | Admitting: Primary Care

## 2020-10-10 ENCOUNTER — Other Ambulatory Visit: Payer: Self-pay

## 2020-10-10 VITALS — BP 128/66 | HR 74 | Temp 98.4°F | Wt 161.9 lb

## 2020-10-10 DIAGNOSIS — F419 Anxiety disorder, unspecified: Secondary | ICD-10-CM

## 2020-10-10 DIAGNOSIS — Z0000 Encounter for general adult medical examination without abnormal findings: Secondary | ICD-10-CM | POA: Diagnosis not present

## 2020-10-10 DIAGNOSIS — Z125 Encounter for screening for malignant neoplasm of prostate: Secondary | ICD-10-CM

## 2020-10-10 DIAGNOSIS — I6523 Occlusion and stenosis of bilateral carotid arteries: Secondary | ICD-10-CM | POA: Diagnosis not present

## 2020-10-10 DIAGNOSIS — E119 Type 2 diabetes mellitus without complications: Secondary | ICD-10-CM

## 2020-10-10 DIAGNOSIS — E785 Hyperlipidemia, unspecified: Secondary | ICD-10-CM

## 2020-10-10 DIAGNOSIS — H6120 Impacted cerumen, unspecified ear: Secondary | ICD-10-CM | POA: Insufficient documentation

## 2020-10-10 DIAGNOSIS — I701 Atherosclerosis of renal artery: Secondary | ICD-10-CM

## 2020-10-10 DIAGNOSIS — I708 Atherosclerosis of other arteries: Secondary | ICD-10-CM

## 2020-10-10 DIAGNOSIS — Z23 Encounter for immunization: Secondary | ICD-10-CM

## 2020-10-10 DIAGNOSIS — I700 Atherosclerosis of aorta: Secondary | ICD-10-CM | POA: Diagnosis not present

## 2020-10-10 DIAGNOSIS — H6123 Impacted cerumen, bilateral: Secondary | ICD-10-CM | POA: Diagnosis not present

## 2020-10-10 DIAGNOSIS — F32A Depression, unspecified: Secondary | ICD-10-CM

## 2020-10-10 DIAGNOSIS — J449 Chronic obstructive pulmonary disease, unspecified: Secondary | ICD-10-CM

## 2020-10-10 DIAGNOSIS — I10 Essential (primary) hypertension: Secondary | ICD-10-CM | POA: Diagnosis not present

## 2020-10-10 DIAGNOSIS — Z87891 Personal history of nicotine dependence: Secondary | ICD-10-CM

## 2020-10-10 LAB — CBC
HCT: 46.1 % (ref 39.0–52.0)
Hemoglobin: 15.3 g/dL (ref 13.0–17.0)
MCHC: 33.1 g/dL (ref 30.0–36.0)
MCV: 93 fl (ref 78.0–100.0)
Platelets: 239 10*3/uL (ref 150.0–400.0)
RBC: 4.96 Mil/uL (ref 4.22–5.81)
RDW: 13.2 % (ref 11.5–15.5)
WBC: 9.2 10*3/uL (ref 4.0–10.5)

## 2020-10-10 LAB — HEMOGLOBIN A1C: Hgb A1c MFr Bld: 6.4 % (ref 4.6–6.5)

## 2020-10-10 LAB — LIPID PANEL
Cholesterol: 101 mg/dL (ref 0–200)
HDL: 38.1 mg/dL — ABNORMAL LOW (ref >39.00)
LDL Cholesterol: 44 mg/dL (ref 0–99)
NonHDL: 62.46
Total CHOL/HDL Ratio: 3
Triglycerides: 94 mg/dL (ref 0.0–149.0)
VLDL: 18.8 mg/dL (ref 0.0–40.0)

## 2020-10-10 LAB — COMPREHENSIVE METABOLIC PANEL
ALT: 39 U/L (ref 0–53)
AST: 24 U/L (ref 0–37)
Albumin: 4.7 g/dL (ref 3.5–5.2)
Alkaline Phosphatase: 87 U/L (ref 39–117)
BUN: 16 mg/dL (ref 6–23)
CO2: 33 mEq/L — ABNORMAL HIGH (ref 19–32)
Calcium: 9.8 mg/dL (ref 8.4–10.5)
Chloride: 104 mEq/L (ref 96–112)
Creatinine, Ser: 0.83 mg/dL (ref 0.40–1.50)
GFR: 94.58 mL/min (ref >60.00)
Glucose, Bld: 77 mg/dL (ref 70–99)
Potassium: 4.4 mEq/L (ref 3.5–5.1)
Sodium: 142 mEq/L (ref 135–145)
Total Bilirubin: 0.4 mg/dL (ref 0.2–1.2)
Total Protein: 7.1 g/dL (ref 6.0–8.3)

## 2020-10-10 LAB — PSA: PSA: 0.25 ng/mL (ref 0.10–4.00)

## 2020-10-10 MED ORDER — ATORVASTATIN CALCIUM 40 MG PO TABS: 40.0000 mg | ORAL_TABLET | Freq: Every day | ORAL | 3 refills | Status: DC

## 2020-10-10 MED ORDER — CITALOPRAM HYDROBROMIDE 40 MG PO TABS: 40.0000 mg | ORAL_TABLET | Freq: Every day | ORAL | 3 refills | Status: DC

## 2020-10-10 MED ORDER — LISINOPRIL 20 MG PO TABS: ORAL_TABLET | ORAL | 3 refills | Status: DC

## 2020-10-10 NOTE — Assessment & Plan Note (Signed)
Well controlled in the office today, continue lisinopril 20 mg, amlodipine 10 mg. CMP pending.

## 2020-10-10 NOTE — Assessment & Plan Note (Signed)
BP well controlled.

## 2020-10-10 NOTE — Assessment & Plan Note (Addendum)
Noted bilaterally.  Consented to irrigation. Irrigated bilaterally. Tolerated well. Right TM post irrigation unremarkable. Unable to completely irrigate out cerumen to left canal, unable to view TM on left side.  Discussed use of Debrox drops at home.

## 2020-10-10 NOTE — Assessment & Plan Note (Signed)
Compliant to atorvastatin 40 mg and aspirin 81 mg. Repeat lipid panel pending.

## 2020-10-10 NOTE — Assessment & Plan Note (Signed)
Compliant to atorvastatin 40 mg and aspirin 81 mg, repeat lipid panel pending.  Continue current regimen.

## 2020-10-10 NOTE — Assessment & Plan Note (Signed)
Declines lung cancer screening despite recommendations. 

## 2020-10-10 NOTE — Assessment & Plan Note (Signed)
Compliant to Metformin 500 mg BID, continue same. A1C pending today.  Referral placed for eye exam. Foot exam next visit.  Pneumonia vaccine UTD. Managed on ACE and statin.  Follow up in 6 months.

## 2020-10-10 NOTE — Assessment & Plan Note (Signed)
No recent follow up with vascular surgery, declines imaging today despite recommendations.  Continue atorvastatin 40 mg and aspirin 81.

## 2020-10-10 NOTE — Patient Instructions (Addendum)
Stop by the lab prior to leaving today. I will notify you of your results once received.   Please find out when your last colonoscopy date was and where you had it.  Schedule nurse visits for the Shingles shots once you've completed your Covid-19 Booster.   Please schedule a follow up appointment in 6 months for diabetes check.   It was a pleasure to see you today!   Preventive Care 29-62 Years Old, Male Preventive care refers to lifestyle choices and visits with your health care provider that can promote health and wellness. This includes:  A yearly physical exam. This is also called an annual wellness visit.  Regular dental and eye exams.  Immunizations.  Screening for certain conditions.  Healthy lifestyle choices, such as: ? Eating a healthy diet. ? Getting regular exercise. ? Not using drugs or products that contain nicotine and tobacco. ? Limiting alcohol use. What can I expect for my preventive care visit? Physical exam Your health care provider will check your:  Height and weight. These may be used to calculate your BMI (body mass index). BMI is a measurement that tells if you are at a healthy weight.  Heart rate and blood pressure.  Body temperature.  Skin for abnormal spots. Counseling Your health care provider may ask you questions about your:  Past medical problems.  Family's medical history.  Alcohol, tobacco, and drug use.  Emotional well-being.  Home life and relationship well-being.  Sexual activity.  Diet, exercise, and sleep habits.  Work and work Statistician.  Access to firearms.  Method of birth control.  Menstrual cycle.  Pregnancy history. What immunizations do I need? Vaccines are usually given at various ages, according to a schedule. Your health care provider will recommend vaccines for you based on your age, medical history, and lifestyle or other factors, such as travel or where you work.   What tests do I need? Blood  tests  Lipid and cholesterol levels. These may be checked every 5 years, or more often if you are over 58 years old.  Hepatitis C test.  Hepatitis B test. Screening  Lung cancer screening. You may have this screening every year starting at age 71 if you have a 30-pack-year history of smoking and currently smoke or have quit within the past 15 years.  Colorectal cancer screening. ? All adults should have this screening starting at age 107 and continuing until age 75. ? Your health care provider may recommend screening at age 33 if you are at increased risk. ? You will have tests every 1-10 years, depending on your results and the type of screening test.  Diabetes screening. ? This is done by checking your blood sugar (glucose) after you have not eaten for a while (fasting). ? You may have this done every 1-3 years.  Mammogram. ? This may be done every 1-2 years. ? Talk with your health care provider about when you should start having regular mammograms. This may depend on whether you have a family history of breast cancer.  BRCA-related cancer screening. This may be done if you have a family history of breast, ovarian, tubal, or peritoneal cancers.  Pelvic exam and Pap test. ? This may be done every 3 years starting at age 64. ? Starting at age 71, this may be done every 5 years if you have a Pap test in combination with an HPV test. Other tests  STD (sexually transmitted disease) testing, if you are at risk.  Bone density  scan. This is done to screen for osteoporosis. You may have this scan if you are at high risk for osteoporosis. Talk with your health care provider about your test results, treatment options, and if necessary, the need for more tests. Follow these instructions at home: Eating and drinking  Eat a diet that includes fresh fruits and vegetables, whole grains, lean protein, and low-fat dairy products.  Take vitamin and mineral supplements as recommended by your  health care provider.  Do not drink alcohol if: ? Your health care provider tells you not to drink. ? You are pregnant, may be pregnant, or are planning to become pregnant.  If you drink alcohol: ? Limit how much you have to 0-1 drink a day. ? Be aware of how much alcohol is in your drink. In the [place] one drink equals one 12 oz bottle of beer (355 mL), one 5 oz glass of wine (148 mL), or one 1 oz glass of hard liquor (44 mL).   Lifestyle  Take daily care of your teeth and gums. Brush your teeth every morning and night with fluoride toothpaste. Floss one time each day.  Stay active. Exercise for at least 30 minutes 5 or more days each week.  Do not use any products that contain nicotine or tobacco, such as cigarettes, e-cigarettes, and chewing tobacco. If you need help quitting, ask your health care provider.  Do not use drugs.  If you are sexually active, practice safe sex. Use a condom or other form of protection to prevent STIs (sexually transmitted infections).  If you do not wish to become pregnant, use a form of birth control. If you plan to become pregnant, see your health care provider for a prepregnancy visit.  If told by your health care provider, take low-dose aspirin daily starting at age 76.  Find healthy ways to cope with stress, such as: ? Meditation, yoga, or listening to music. ? Journaling. ? Talking to a trusted person. ? Spending time with friends and family. Safety  Always wear your seat belt while driving or riding in a vehicle.  Do not drive: ? If you have been drinking alcohol. Do not ride with someone who has been drinking. ? When you are tired or distracted. ? While texting.  Wear a helmet and other protective equipment during sports activities.  If you have firearms in your house, make sure you follow all gun safety procedures. What's next?  Visit your health care provider once a year for an annual wellness visit.  Ask your health care  provider how often you should have your eyes and teeth checked.  Stay up to date on all vaccines. This information is not intended to replace advice given to you by your health care provider. Make sure you discuss any questions you have with your health care provider. Document Revised: 06/03/2020 Document Reviewed: 05/11/2018 Elsevier Patient Education  2021 [employer].

## 2020-10-10 NOTE — Assessment & Plan Note (Signed)
Doing well on citalopram 40 mg, continue same.  

## 2020-10-10 NOTE — Assessment & Plan Note (Signed)
Overall doing well on Symbicort BID and albuterol PRN. I recommended he repeat lung cancer screening, he declines.

## 2020-10-10 NOTE — Assessment & Plan Note (Signed)
Compliant to atorvastatin 40 mg and aspirin 81 mg, did not complete screening in November 2021.   I recommended he proceed with his imaging, he declines. We discussed risks for not monitoring.

## 2020-10-10 NOTE — Assessment & Plan Note (Signed)
Tetanus UTD per patient. Influenza vaccine provided today. Due for Shingrix next, we discussed this today. PSA due and pending.  From chart review it appers that he is due for repeat colonoscopy, he doesn't agree but will double check and get back with [place].  Discussed the importance of a healthy diet and regular exercise in order for weight loss, and to reduce the risk of any potential medical problems.  Exam today stable. Labs pending.

## 2020-10-10 NOTE — Progress Notes (Signed)
Subjective:    Patient ID: Roy Richards, adult    DOB: [birth year], 60 y.o.   MRN: [id number]  HPI  This visit occurred during the SARS-CoV-2 public health emergency.  Safety protocols were in place, including screening questions prior to the visit, additional usage of staff PPE, and extensive cleaning of exam room while observing appropriate contact time as indicated for disinfecting solutions.   Roy Richards is a 60 year old male with a history of aortic atherosclerosis, hypertension, carotid stenosis, renal artery stenosis, COPD, diabetes, anxiety and depression, hyperlipidemia who presents today for follow up and complete physical.   Immunizations: -Tetanus: UTD per patient -Influenza: Due today -Shingles: Never completed  -Pneumonia: Completed one dose a few weeks ago -Covid-19: Completed two vaccines  Diet: He endorses a healthy diet Exercise: He is active at home, no regular exercise.   Eye exam: Due, he will schedule  Dental exam:  No recent exam  Colonoscopy: Completed in 2017, due in 2022. He endorses that he's not due.  PSA: Due Hep C Screen: Negative Lung Cancer Screening: Negative in 2020, declines   BP Readings from Last 3 Encounters:  10/10/20 128/66  05/07/20 124/84  10/08/19 128/66     Review of Systems  Constitutional: Negative for unexpected weight change.  HENT: Negative for rhinorrhea.   Respiratory: Positive for shortness of breath. Negative for cough.        Chronic SOB  Cardiovascular: Negative for chest pain.  Gastrointestinal: Negative for constipation and diarrhea.  Genitourinary: Negative for difficulty urinating.  Musculoskeletal: Negative for arthralgias and myalgias.  Skin: Negative for rash.  Allergic/Immunologic: Negative for environmental allergies.  Neurological: Negative for dizziness, numbness and headaches.  Psychiatric/Behavioral: The patient is not nervous/anxious.        Past Medical History:  Diagnosis Date  . Acute colitis  06/26/2019  . Diabetes mellitus without complication (HCC)   . Hyperlipidemia   . Hypertension      Social History   Socioeconomic History  . Marital status: Single    Spouse name: Not on file  . Number of children: Not on file  . Years of education: Not on file  . Highest education level: Not on file  Occupational History  . Not on file  Tobacco Use  . Smoking status: Current Some Day Smoker    Types: Cigarettes  . Smokeless tobacco: Never Used  Substance and Sexual Activity  . Alcohol use: Yes    Comment: occasionally  . Drug use: Never  . Sexual activity: Yes  Other Topics Concern  . Not on file  Social History Narrative  . Not on file   Social Determinants of Health   Financial Resource Strain: Not on file  Food Insecurity: Not on file  Transportation Needs: Not on file  Physical Activity: Not on file  Stress: Not on file  Social Connections: Not on file  Intimate Partner Violence: Not on file    Past Surgical History:  Procedure Laterality Date  . HERNIA REPAIR      Family History  Problem Relation Age of Onset  . COPD Mother   . Heart disease Father   . Hyperlipidemia Father   . Hypertension Father     No Known Allergies  Current Outpatient Medications on File Prior to Visit  Medication Sig Dispense Refill  . albuterol (VENTOLIN HFA) 108 (90 Base) MCG/ACT inhaler INHALE ONE TO TWO PUFFS INTO THE LUNGS EVERY FOUR HOURS AS NEEDED FOR WHEEZING OR SHORTNESS OF BREATH  18 g 0  . amLODipine (NORVASC) 10 MG tablet Take 1 tablet (10 mg total) by mouth daily. For blood pressure. 90 tablet 3  . aspirin EC 81 MG tablet Take 81 mg by mouth daily.    . metFORMIN (GLUCOPHAGE) 500 MG tablet TAKE ONE TABLET BY MOUTH TWICE A DAY WITH MEALS FOR DIABETES 60 tablet 0  . Multiple Vitamins-Minerals (MULTIVITAMIN ADULT PO) Take by mouth.    . SYMBICORT 160-4.5 MCG/ACT inhaler Inhale 2 puffs into the lungs 2 (two) times daily. 1 each 11   No current facility-administered  medications on file prior to visit.    BP 128/66   Pulse 74   Temp 98.4 F (36.9 C) (Tympanic)   Wt 161 lb 14.4 oz (73.4 kg)   SpO2 94%   BMI 29.61 kg/m    Objective:   Physical Exam Constitutional:      Appearance: He is well-nourished.  HENT:     Right Ear: Tympanic membrane and ear canal normal. There is impacted cerumen.     Left Ear: Tympanic membrane and ear canal normal. There is impacted cerumen.     Mouth/Throat:     Mouth: Oropharynx is clear and moist.  Eyes:     Extraocular Movements: EOM normal.     Pupils: Pupils are equal, round, and reactive to light.  Cardiovascular:     Rate and Rhythm: Normal rate and regular rhythm.  Pulmonary:     Effort: Pulmonary effort is normal.     Breath sounds: Normal breath sounds.  Abdominal:     General: Bowel sounds are normal.     Palpations: Abdomen is soft.     Tenderness: There is no abdominal tenderness.  Musculoskeletal:        General: Normal range of motion.     Cervical back: Neck supple.  Skin:    General: Skin is warm and dry.  Neurological:     Mental Status: He is alert and oriented to person, place, and time.     Cranial Nerves: No cranial nerve deficit.     Deep Tendon Reflexes:     Reflex Scores:      Patellar reflexes are 2+ on the right side and 2+ on the left side. Psychiatric:        Mood and Affect: Mood and affect and mood normal.            Assessment & Plan:

## 2020-10-28 ENCOUNTER — Other Ambulatory Visit: Payer: Self-pay | Admitting: Primary Care

## 2020-10-28 DIAGNOSIS — E119 Type 2 diabetes mellitus without complications: Secondary | ICD-10-CM

## 2020-11-03 ENCOUNTER — Other Ambulatory Visit: Payer: Self-pay

## 2020-11-03 DIAGNOSIS — J449 Chronic obstructive pulmonary disease, unspecified: Secondary | ICD-10-CM

## 2020-11-03 MED ORDER — ALBUTEROL SULFATE HFA 108 (90 BASE) MCG/ACT IN AERS: 1.0000 | INHALATION_SPRAY | RESPIRATORY_TRACT | 2 refills | Status: DC | PRN

## 2020-12-11 ENCOUNTER — Telehealth: Payer: Self-pay | Admitting: Primary Care

## 2020-12-11 NOTE — Telephone Encounter (Signed)
Type of forms received:Handicapped Placard  Routed [id number]  Paperwork received by Marilynne Drivers requesting form]: Patient   Individual made aware of 3-5 business day turn around (Y/N):Patient aware Jae Dire is out of the office until 12/19/20  Faxed to :   Form location: [facility]   Please call patient when form is ready for pick up.

## 2020-12-12 ENCOUNTER — Other Ambulatory Visit: Payer: Self-pay | Admitting: Internal Medicine

## 2020-12-12 ENCOUNTER — Ambulatory Visit: Payer: 59 | Attending: Internal Medicine

## 2020-12-12 DIAGNOSIS — Z23 Encounter for immunization: Secondary | ICD-10-CM

## 2020-12-12 NOTE — Progress Notes (Signed)
   Covid-19 Vaccination Clinic  Name:  Roy Richards    MRN: [id number] DOB: [birth year]  12/12/2020  [name] was observed post Covid-19 immunization for 15 minutes without incident. He was provided with Vaccine Information Sheet and instruction to access the V-Safe system.   [name] was instructed to call 911 with any severe reactions post vaccine: Marland Kitchen Difficulty breathing  . Swelling of face and throat  . A fast heartbeat  . A bad rash all over body  . Dizziness and weakness   Immunizations Administered    Name Date Dose VIS Date Route   PFIZER Comrnaty(Gray TOP) Covid-19 Vaccine 12/12/2020 11:37 AM 0.3 mL 08/21/2020 Intramuscular   Manufacturer: [employer], [employer]   Lot: [id number]   NDC: [phone]

## 2020-12-12 NOTE — Telephone Encounter (Signed)
Placed in your folder for review. 

## 2020-12-19 NOTE — Telephone Encounter (Signed)
What is the reason for his request? We need this documented for every patient requesting a handicap placard. Also, is this is first request or a renewal?

## 2020-12-19 NOTE — Telephone Encounter (Signed)
Noted, completed and handed to [facility].

## 2020-12-19 NOTE — Telephone Encounter (Signed)
Per patient this is a renewal for not able to walk more than 200 ft without becoming short of breath.

## 2020-12-22 NOTE — Telephone Encounter (Signed)
Called patient informed ready will pick up at reception.

## 2020-12-26 ENCOUNTER — Other Ambulatory Visit: Payer: Self-pay | Admitting: Primary Care

## 2020-12-26 DIAGNOSIS — F32A Depression, unspecified: Secondary | ICD-10-CM

## 2020-12-26 DIAGNOSIS — F419 Anxiety disorder, unspecified: Secondary | ICD-10-CM

## 2020-12-26 DIAGNOSIS — I10 Essential (primary) hypertension: Secondary | ICD-10-CM

## 2021-02-06 ENCOUNTER — Other Ambulatory Visit: Payer: Self-pay | Admitting: Primary Care

## 2021-02-06 DIAGNOSIS — E119 Type 2 diabetes mellitus without complications: Secondary | ICD-10-CM

## 2021-02-06 NOTE — Telephone Encounter (Signed)
Patient is due for diabetes follow up, please schedule.

## 2021-02-11 NOTE — Telephone Encounter (Signed)
Called f/u made.

## 2021-02-14 ENCOUNTER — Other Ambulatory Visit: Payer: Self-pay | Admitting: Primary Care

## 2021-02-14 DIAGNOSIS — E119 Type 2 diabetes mellitus without complications: Secondary | ICD-10-CM

## 2021-02-14 DIAGNOSIS — J449 Chronic obstructive pulmonary disease, unspecified: Secondary | ICD-10-CM

## 2021-02-17 LAB — HM DIABETES EYE EXAM

## 2021-03-01 ENCOUNTER — Other Ambulatory Visit: Payer: Self-pay | Admitting: Primary Care

## 2021-03-01 DIAGNOSIS — J449 Chronic obstructive pulmonary disease, unspecified: Secondary | ICD-10-CM

## 2021-03-03 ENCOUNTER — Other Ambulatory Visit: Payer: Self-pay

## 2021-03-03 ENCOUNTER — Ambulatory Visit (INDEPENDENT_AMBULATORY_CARE_PROVIDER_SITE_OTHER): Payer: 59 | Admitting: Primary Care

## 2021-03-03 ENCOUNTER — Other Ambulatory Visit: Payer: Self-pay | Admitting: Primary Care

## 2021-03-03 ENCOUNTER — Encounter: Payer: Self-pay | Admitting: Primary Care

## 2021-03-03 VITALS — BP 132/64 | HR 72 | Temp 98.2°F | Ht 62.0 in | Wt 146.0 lb

## 2021-03-03 DIAGNOSIS — E119 Type 2 diabetes mellitus without complications: Secondary | ICD-10-CM | POA: Diagnosis not present

## 2021-03-03 DIAGNOSIS — J449 Chronic obstructive pulmonary disease, unspecified: Secondary | ICD-10-CM

## 2021-03-03 LAB — POCT GLYCOSYLATED HEMOGLOBIN (HGB A1C): Hemoglobin A1C: 5.9 % — AB (ref 4.0–5.6)

## 2021-03-03 MED ORDER — ALBUTEROL SULFATE HFA 108 (90 BASE) MCG/ACT IN AERS: 1.0000 | INHALATION_SPRAY | Freq: Four times a day (QID) | RESPIRATORY_TRACT | 0 refills | Status: DC | PRN

## 2021-03-03 NOTE — Assessment & Plan Note (Signed)
Improved with A1C today of 5.9.  Commended him on weight loss through dietary changes.  Continue metformin 500 mg BID. Foot exam today. Managed on statin and ACE-I. Pneumonia vaccine UTD. Eye exam UTD.  Follow up in 6 months.

## 2021-03-03 NOTE — Patient Instructions (Addendum)
Continue to work on a healthy diet.   Notify me if you continue to use your albuterol inhaler recurrently.  Please schedule a physical to meet with me in January 2023.   It was a pleasure to see you today!

## 2021-03-03 NOTE — Assessment & Plan Note (Addendum)
Increased use of albuterol inhaler, is more active now. Compliant to Symbicort as prescribed.  Discussed to notify if he continues to notice recurrent use of albuterol inhaler, he will not use if he doesn't need it. Consider changing regimen. Refill provided today.  Offered lung cancer screening for which he declines.

## 2021-03-03 NOTE — Progress Notes (Signed)
Subjective:    Patient ID: Roy Richards, male    DOB: 1960/09/23, 60 y.o.   MRN: [id number]  HPI  [name] is a very pleasant 60 y.o. male with a history of aortic atherosclerosis, hypertension, renal artery stenosis, diabetes, COPD, anxiety and depression who presents today for follow up of diabetes.  He is also requesting a refill of his albuterol inhaler. He is using 6 puffs per day. Is compliant to his Symbicort. He's more active right now, is working outdoors in his shed. Somedays he doesn't feel like he needs it but uses it anyway.  Current medications include: Metformin 500 mg BID.   He is checking his blood glucose 0 times daily.   Last A1C: 6.4 in January 2022 Last Eye Exam: Completed at [facility] Last Foot Exam: Due Pneumonia Vaccination: 2022 Urine Microalbumin: None. Lisinopril  Statin: atorvastatin   Dietary changes since last visit: He's stopped soda, drinking mostly green tea and water. Limiting portion size.    Exercise: Active.   BP Readings from Last 3 Encounters:  03/03/21 132/64  10/10/20 128/66  05/07/20 124/84   Wt Readings from Last 3 Encounters:  03/03/21 146 lb (66.2 kg)  10/10/20 161 lb 14.4 oz (73.4 kg)  05/07/20 155 lb 8 oz (70.5 kg)       Review of Systems  Eyes:  Negative for visual disturbance.  Cardiovascular:  Negative for chest pain.  Neurological:  Negative for dizziness and numbness.        Past Medical History:  Diagnosis Date   Acute colitis 06/26/2019   Diabetes mellitus without complication (HCC)    Hyperlipidemia    Hypertension     Social History   Socioeconomic History   Marital status: Single    Spouse name: Not on file   Number of children: Not on file   Years of education: Not on file   Highest education level: Not on file  Occupational History   Not on file  Tobacco Use   Smoking status: Some Days    Pack years: 0.00    Types: Cigarettes   Smokeless tobacco: Never  Substance and Sexual  Activity   Alcohol use: Yes    Comment: occasionally   Drug use: Never   Sexual activity: Yes  Other Topics Concern   Not on file  Social History Narrative   Not on file   Social Determinants of Health   Financial Resource Strain: Not on file  Food Insecurity: Not on file  Transportation Needs: Not on file  Physical Activity: Not on file  Stress: Not on file  Social Connections: Not on file  Intimate Partner Violence: Not on file    Past Surgical History:  Procedure Laterality Date   HERNIA REPAIR      Family History  Problem Relation Age of Onset   COPD Mother    Heart disease Father    Hyperlipidemia Father    Hypertension Father     No Known Allergies  Current Outpatient Medications on File Prior to Visit  Medication Sig Dispense Refill   amLODipine (NORVASC) 10 MG tablet TAKE ONE TABLET BY MOUTH DAILY FOR BLOOD PRESSURE 90 tablet 3   aspirin EC 81 MG tablet Take 81 mg by mouth daily.     atorvastatin (LIPITOR) 40 MG tablet Take 1 tablet (40 mg total) by mouth daily. For cholesterol. 90 tablet 3   citalopram (CELEXA) 40 MG tablet TAKE ONE TABLET BY MOUTH DAILY FOR ANXIETY  90 tablet 3   COVID-19 mRNA Vac-TriS, Pfizer, SUSP injection USE AS DIRECTED .3 mL 0   COVID-19 mRNA vaccine, Pfizer, 30 MCG/0.3ML injection USE AS DIRECTED .3 mL 0   lisinopril (ZESTRIL) 20 MG tablet TAKE ONE TABLET BY MOUTH DAILY FOR BLOOD PRESSURE 90 tablet 3   metFORMIN (GLUCOPHAGE) 500 MG tablet TAKE ONE TABLET BY MOUTH TWICE A DAY [facility] MEAL FOR DIABETES 180 tablet 0   Multiple Vitamins-Minerals (MULTIVITAMIN ADULT PO) Take by mouth.     SYMBICORT 160-4.5 MCG/ACT inhaler Inhale 2 puffs into the lungs 2 (two) times daily. 1 each 11   No current facility-administered medications on file prior to visit.    BP 132/64   Pulse 72   Temp 98.2 F (36.8 C) (Temporal)   Ht 5\' 2"  (1.575 m)   Wt 146 lb (66.2 kg)   SpO2 95%   BMI 26.70 kg/m  Objective:   Physical Exam Cardiovascular:      Rate and Rhythm: Normal rate and regular rhythm.  Pulmonary:     Effort: Pulmonary effort is normal.     Breath sounds: Normal breath sounds. No wheezing or rales.  Musculoskeletal:     Cervical back: Neck supple.  Skin:    General: Skin is warm and dry.  Neurological:     Mental Status: He is alert and oriented to person, place, and time.          Assessment & Plan:      This visit occurred during the SARS-CoV-2 public health emergency.  Safety protocols were in place, including screening questions prior to the visit, additional usage of staff PPE, and extensive cleaning of exam room while observing appropriate contact time as indicated for disinfecting solutions.

## 2021-03-17 ENCOUNTER — Encounter: Payer: Self-pay | Admitting: Primary Care

## 2021-04-16 ENCOUNTER — Other Ambulatory Visit: Payer: Self-pay

## 2021-04-16 ENCOUNTER — Other Ambulatory Visit: Payer: Self-pay | Admitting: Primary Care

## 2021-04-16 ENCOUNTER — Encounter: Payer: Self-pay | Admitting: Emergency Medicine

## 2021-04-16 ENCOUNTER — Ambulatory Visit
Admission: EM | Admit: 2021-04-16 | Discharge: 2021-04-16 | Disposition: A | Discharge status: Home / Self Care | Payer: 59 | Attending: Emergency Medicine | Admitting: Emergency Medicine

## 2021-04-16 ENCOUNTER — Ambulatory Visit (INDEPENDENT_AMBULATORY_CARE_PROVIDER_SITE_OTHER): Payer: 59

## 2021-04-16 DIAGNOSIS — J069 Acute upper respiratory infection, unspecified: Secondary | ICD-10-CM | POA: Diagnosis not present

## 2021-04-16 DIAGNOSIS — R0602 Shortness of breath: Secondary | ICD-10-CM | POA: Diagnosis not present

## 2021-04-16 DIAGNOSIS — J441 Chronic obstructive pulmonary disease with (acute) exacerbation: Secondary | ICD-10-CM | POA: Diagnosis not present

## 2021-04-16 DIAGNOSIS — R093 Abnormal sputum: Secondary | ICD-10-CM

## 2021-04-16 DIAGNOSIS — R911 Solitary pulmonary nodule: Secondary | ICD-10-CM

## 2021-04-16 DIAGNOSIS — R509 Fever, unspecified: Secondary | ICD-10-CM | POA: Diagnosis not present

## 2021-04-16 DIAGNOSIS — J449 Chronic obstructive pulmonary disease, unspecified: Secondary | ICD-10-CM

## 2021-04-16 MED ORDER — AZITHROMYCIN 250 MG PO TABS: 250.0000 mg | ORAL_TABLET | Freq: Every day | ORAL | 0 refills | Status: DC

## 2021-04-16 MED ORDER — AMOXICILLIN-POT CLAVULANATE 875-125 MG PO TABS: 1.0000 | ORAL_TABLET | Freq: Two times a day (BID) | ORAL | 0 refills | Status: DC

## 2021-04-16 MED ORDER — PREDNISONE 10 MG PO TABS: 40.0000 mg | ORAL_TABLET | Freq: Every day | ORAL | 0 refills | Status: DC

## 2021-04-16 NOTE — ED Provider Notes (Signed)
[name]    CSN: [id number] Arrival date & time: 04/16/21  0802      History   Chief Complaint Chief Complaint  Patient presents with   Cough   Fever     HPI Roy Richards is a 60 y.o. male.  Patient presents with 3-day history of fever, cough, congestion, shortness of breath, wheezing.  T-max 100.7.  He denies rash, vomiting, diarrhea, or other symptoms.  Treatment attempted at home with ibuprofen.  His medical history includes COPD, hypertension, diabetes.  Current smoker.   The history is provided by the patient and medical records.   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 Authorizing Provider  amLODipine (NORVASC) 10 MG tablet TAKE ONE TABLET BY MOUTH DAILY FOR BLOOD PRESSURE 12/29/20  Yes [name], NP  amoxicillin-clavulanate (AUGMENTIN) 875-125 MG tablet Take 1 tablet by mouth every 12 (twelve) hours. 04/16/21  Yes Mickie Bail, NP  aspirin EC 81 MG tablet Take 81 mg by mouth daily.   Yes [provider]  atorvastatin (LIPITOR) 40 MG tablet Take 1 tablet (40 mg total) by mouth daily.  For cholesterol. 10/10/20  Yes [name], NP  citalopram (CELEXA) 40 MG tablet TAKE ONE TABLET BY MOUTH DAILY FOR ANXIETY 12/29/20  Yes [name], NP  lisinopril (ZESTRIL) 20 MG tablet TAKE ONE TABLET BY MOUTH DAILY FOR BLOOD PRESSURE 10/10/20  Yes [name], NP  metFORMIN (GLUCOPHAGE) 500 MG tablet TAKE ONE TABLET BY MOUTH TWICE A DAY [facility] MEAL FOR DIABETES 02/14/21  Yes [name], NP  Multiple Vitamins-Minerals (MULTIVITAMIN ADULT PO) Take by mouth.   Yes [provider]  predniSONE (DELTASONE) 10 MG tablet Take 4 tablets (40 mg total) by mouth daily for 5 days. 04/16/21 04/21/21 Yes Mickie Bail, NP  SYMBICORT 160-4.5 MCG/ACT inhaler Inhale 2 puffs into the lungs 2 (two) times daily. 05/07/20  Yes [name], NP  albuterol (VENTOLIN HFA) 108 (90 Base) MCG/ACT inhaler Inhale 1-2 puffs into the lungs every 6 (six) hours as needed for wheezing or shortness of breath. 03/03/21   [name], NP  COVID-19 mRNA Vac-TriS, Pfizer, SUSP injection USE AS DIRECTED 12/12/20 12/12/21  Judyann Munson, MD  COVID-19 mRNA vaccine, Pfizer, 30 MCG/0.3ML injection USE AS DIRECTED 06/27/20 06/27/21  Judyann Munson, MD    Family History Family History  Problem Relation Age of Onset   COPD Mother    Heart disease Father    Hyperlipidemia Father    Hypertension Father     Social History Social History   Tobacco Use  Smoking status: Some Days    Packs/day: 0.50    Years: 25.00    Pack years: 12.50    Types: Cigarettes   Smokeless tobacco: Never  Substance Use Topics   Alcohol use: Yes    Comment: occasionally   Drug use: Never     Allergies   Patient has no known allergies.   Review of Systems Review of Systems  Constitutional:  Positive for fever. Negative for chills.  HENT:  Positive for congestion. Negative for ear pain and sore throat.   Respiratory:  Positive for cough, shortness of breath and wheezing.   Cardiovascular:  Negative for  chest pain and palpitations.  Gastrointestinal:  Negative for abdominal pain and vomiting.  Skin:  Negative for color change and rash.  All other systems reviewed and are negative.   Physical Exam Triage Vital Signs ED Triage Vitals  Enc Vitals Group     BP      Pulse      Resp      Temp      Temp src      SpO2      Weight      Height      Head Circumference      Peak Flow      Pain Score      Pain Loc      Pain Edu?      Excl. in GC?    No data found.  Updated Vital Signs BP 120/66 (BP Location: Left Arm)   Pulse 99   Temp 98.6 F (37 C)   Resp 20   SpO2 92%   Visual Acuity Right Eye Distance:   Left Eye Distance:   Bilateral Distance:    Right Eye Near:   Left Eye Near:    Bilateral Near:     Physical Exam Vitals and nursing note reviewed.  Constitutional:      General: He is not in acute distress.    Appearance: He is well-developed.  HENT:     Head: Normocephalic and atraumatic.     Right Ear: Tympanic membrane normal.     Left Ear: Tympanic membrane normal.     Nose: Nose normal.     Mouth/Throat:     Mouth: Mucous membranes are moist.     Pharynx: Oropharynx is clear.  Eyes:     Conjunctiva/sclera: Conjunctivae normal.  Cardiovascular:     Rate and Rhythm: Normal rate and regular rhythm.     Heart sounds: Normal heart sounds.  Pulmonary:     Effort: Pulmonary effort is normal. No respiratory distress.     Breath sounds: Wheezing present.     Comments: Mild inspiratory and expiratory wheezing throughout.  Abdominal:     Palpations: Abdomen is soft.     Tenderness: There is no abdominal tenderness.  Musculoskeletal:     Cervical back: Neck supple.  Skin:    General: Skin is warm and dry.  Neurological:     General: No focal deficit present.     Mental Status: He is alert and oriented to person, place, and time.     Gait: Gait normal.  Psychiatric:        Mood and Affect: Mood normal.        Behavior: Behavior normal.     UC  Treatments / Results  Labs (all labs ordered are listed, but only abnormal results are displayed) Labs Reviewed  NOVEL CORONAVIRUS, NAA  COVID-19, FLU A+B NAA  EKG   Radiology DG Chest 2 View  Result Date: 04/16/2021 CLINICAL DATA:  60 year old male with history of shortness of breath productive of yellow green mucus. Fever for the past 3 days. EXAM: CHEST - 2 VIEW COMPARISON:  Chest x-ray 05/07/2020. FINDINGS: Lung volumes are normal. No consolidative airspace disease. No pleural effusions. No pneumothorax. Increasingly conspicuous vague nodular density projecting over the left upper lobe currently estimated to measure 1.8 cm in diameter. Pulmonary vasculature and the cardiomediastinal silhouette are within normal limits. Atherosclerosis in the thoracic aorta. IMPRESSION: 1.  No radiographic evidence of acute cardiopulmonary disease. 2. Increasingly conspicuous left upper lobe nodule. Findings are concerning for potential malignancy, and as previously was recommended, attention on follow-up low-dose lung cancer screening chest CT is strongly recommended in the near future. This patient is overdue for annual lung cancer screening which should have been performed in November 2021 and is recommended at this time. 3. Aortic atherosclerosis. Electronically Signed   By: Trudie Reed M.D.   On: 04/16/2021 08:47    Procedures Procedures (including critical care time)  Medications Ordered in UC Medications - No data to display  Initial Impression / Assessment and Plan / UC Course  I have reviewed the triage vital signs and the nursing notes.  Pertinent labs & imaging results that were available during my care of the patient were reviewed by me and considered in my medical decision making (see chart for details).  COPD exacerbation, viral URI, nodule in left lung.  Discussed x-ray findings with patient at length; discussed that he needs to schedule an appointment with his PCP to discuss the  findings and to schedule a chest CT for evaluation of the nodule in his left lung.  Treating COPD exacerbation with prednisone and Augmentin.  Instructed patient to continue using albuterol at home as directed.  COVID and flu pending.  Discussed quarantine per CDC guidelines.  Discussed Tylenol or ibuprofen as needed for fever or discomfort.  Instructed patient to schedule follow-up with his PCP ASAP.  He agrees to plan of care.   Final Clinical Impressions(s) / UC Diagnoses   Final diagnoses:  COPD exacerbation (HCC)  Viral URI  Lung nodule     Discharge Instructions      You have a nodule in your left lung that needs further evaluation.  Schedule an appointment with your primary care provider as soon as possible.    Take the prednisone and Augmentin as directed.  Continue to use your albuterol as directed.    Your COVID and Flu tests are pending.         ED Prescriptions     Medication Sig Dispense Auth. Provider   predniSONE (DELTASONE) 10 MG tablet Take 4 tablets (40 mg total) by mouth daily for 5 days. 20 tablet Mickie Bail, NP   azithromycin (ZITHROMAX) 250 MG tablet  (Status: Discontinued) Take 1 tablet (250 mg total) by mouth daily. Take first 2 tablets together, then 1 every day until finished. 6 tablet Mickie Bail, NP   amoxicillin-clavulanate (AUGMENTIN) 875-125 MG tablet Take 1 tablet by mouth every 12 (twelve) hours. 14 tablet Mickie Bail, NP      PDMP not reviewed this encounter.   Mickie Bail, NP 04/16/21 [phone]

## 2021-04-16 NOTE — Discharge Instructions (Addendum)
You have a nodule in your left lung that needs further evaluation.  Schedule an appointment with your primary care provider as soon as possible.    Take the prednisone and Augmentin as directed.  Continue to use your albuterol as directed.    Your COVID and Flu tests are pending.

## 2021-04-16 NOTE — ED Triage Notes (Signed)
Patient c/o productive cough w/ "yellow to green" mucus and fever x 3 days.   Patient endorses a fever of 100.7 f at home.   Patient endorses chest congestion.   Patient endorses having a COVID test done with a negative result per patient statement.   Patient endorses increasing SOB.   History of COPD.   Patient endorses his baseline oxygen saturation is 93% at home.   Patient has taken Advil and increased fluid intake with no relief symptoms.

## 2021-04-18 LAB — COVID-19, FLU A+B NAA
Influenza A, NAA: NOT DETECTED
Influenza B, NAA: NOT DETECTED
SARS-CoV-2, NAA: NOT DETECTED

## 2021-04-27 ENCOUNTER — Other Ambulatory Visit: Payer: Self-pay | Admitting: Family Medicine

## 2021-04-27 MED ORDER — PREDNISONE 10 MG PO TABS: ORAL_TABLET | ORAL | 0 refills | Status: DC

## 2021-04-27 MED ORDER — BENZONATATE 200 MG PO CAPS: 200.0000 mg | ORAL_CAPSULE | Freq: Three times a day (TID) | ORAL | 1 refills | Status: DC | PRN

## 2021-04-27 NOTE — Telephone Encounter (Signed)
Spoke to patient by telephone and was advised that he is feeling a lot better than he was went he went to the UC about 10 days ago. Patient denies a fever. Patient stated that his SOB is not any worse than it has been for the past 10 years. Patient stated that he has a nagging cough that he can not get rid of. Patient stated that he is using his inhalers and they do help. Patient stated that he has congestion in his chest that seems to not want to come up. Patient stated that his mom just passed away and needs something to help with his cough. Pharmacy [facility]

## 2021-04-27 NOTE — Telephone Encounter (Signed)
Please call to triage

## 2021-05-04 DIAGNOSIS — R911 Solitary pulmonary nodule: Secondary | ICD-10-CM

## 2021-05-04 DIAGNOSIS — Z87891 Personal history of nicotine dependence: Secondary | ICD-10-CM

## 2021-05-06 ENCOUNTER — Other Ambulatory Visit: Payer: Self-pay | Admitting: Primary Care

## 2021-05-06 DIAGNOSIS — J449 Chronic obstructive pulmonary disease, unspecified: Secondary | ICD-10-CM

## 2021-05-07 ENCOUNTER — Other Ambulatory Visit: Payer: Self-pay | Admitting: *Deleted

## 2021-05-07 DIAGNOSIS — Z87891 Personal history of nicotine dependence: Secondary | ICD-10-CM

## 2021-05-07 DIAGNOSIS — F17210 Nicotine dependence, cigarettes, uncomplicated: Secondary | ICD-10-CM

## 2021-05-07 NOTE — Telephone Encounter (Signed)
Ashtyn,  Did we get anywhere with his CT scan? I can see some activity but not sure what that means.  Thanks! Mayra Reel, NP-C

## 2021-05-11 NOTE — Telephone Encounter (Signed)
As of 05/08/21, Mr Oleson was being scheduled for his CT by [facility] Lung [facility] Screening [facility].  I tried to schedule it Clovis Cao 05/07/21 and they would not allow me to so I had to figure out who schedules those. There is a new process with these CTs since the [facility] Program shut down, [facility] has absorbed the [facility] Program patients.   Abigail Miyamoto, RN with [facility] has gotten him scheduled.  They did have to change the order to being ordered by Kandice Robinsons, NP (program coordinator and NP). He is already a part of the LCS Program and was being followed by [facility] back in November 2020 but has not scheduled follow ups with them since. The results of the CT will go to Kandice Robinsons, NP but I have asked that you be copied on them.   You should be seeing those results soon.   I have copied Maralyn Sago and Angelique Blonder on this message so that you are made aware of results.

## 2021-05-12 NOTE — Telephone Encounter (Signed)
We should not be ordering these.  I cannot precert them and we cannot schedule them. They have to "eligible" for the program and have to be a part of the Lung Cancer Screening Program. They either need to be referred to the Lung Cancer Screening program.  Amb Referral Lung Cancer Screening.   The nurse then takes care of seeing if the patient is eligible and will call the patient to initiate scheduling AND do any precerts needed.   There is more to come regarding the processes with [facility] Program closing down.  Hopefully at that time that will close any and all gaps.

## 2021-05-12 NOTE — Telephone Encounter (Signed)
Thank you ladies!!  What is our process moving forward? I have numerous patients enrolled in the [facility] lung cancer screening program? When I try to order the low dose CT for lung cancer screening myself, I get rejected by Medicare regardless of the diagnosis.

## 2021-05-13 ENCOUNTER — Ambulatory Visit: Payer: 59 | Admitting: Primary Care

## 2021-05-14 NOTE — Telephone Encounter (Signed)
Noted, patient is scheduled for 05/19/21

## 2021-05-19 ENCOUNTER — Other Ambulatory Visit: Payer: Self-pay

## 2021-05-19 ENCOUNTER — Ambulatory Visit
Admission: RE | Admit: 2021-05-19 | Discharge: 2021-05-19 | Disposition: A | Discharge status: Home / Self Care | Payer: 59 | Source: Ambulatory Visit | Attending: Acute Care | Admitting: Acute Care

## 2021-05-19 DIAGNOSIS — F17210 Nicotine dependence, cigarettes, uncomplicated: Secondary | ICD-10-CM | POA: Insufficient documentation

## 2021-05-19 DIAGNOSIS — Z87891 Personal history of nicotine dependence: Secondary | ICD-10-CM | POA: Insufficient documentation

## 2021-05-21 ENCOUNTER — Other Ambulatory Visit: Payer: Self-pay

## 2021-05-21 ENCOUNTER — Encounter: Payer: Self-pay | Admitting: Primary Care

## 2021-05-21 ENCOUNTER — Ambulatory Visit (INDEPENDENT_AMBULATORY_CARE_PROVIDER_SITE_OTHER): Payer: 59 | Admitting: Primary Care

## 2021-05-21 DIAGNOSIS — Z23 Encounter for immunization: Secondary | ICD-10-CM | POA: Diagnosis not present

## 2021-05-21 DIAGNOSIS — J449 Chronic obstructive pulmonary disease, unspecified: Secondary | ICD-10-CM

## 2021-05-21 MED ORDER — FLUTICASONE-SALMETEROL 250-50 MCG/ACT IN AEPB: 1.0000 | INHALATION_SPRAY | Freq: Two times a day (BID) | RESPIRATORY_TRACT | 0 refills | Status: DC

## 2021-05-21 MED ORDER — ALBUTEROL SULFATE HFA 108 (90 BASE) MCG/ACT IN AERS: 1.0000 | INHALATION_SPRAY | Freq: Four times a day (QID) | RESPIRATORY_TRACT | 0 refills | Status: DC | PRN

## 2021-05-21 NOTE — Progress Notes (Signed)
Subjective:    Patient ID: Roy Richards, male    DOB: 03/05/1961, 60 y.o.   MRN: [id number]  HPI  Roy Richards is a very pleasant 60 y.o. male with a history of tobacco abuse, type 2 diabetes, COPD, hypertension, hyperlipidemia who presents today to discuss dyspnea.   He is also needing a tetanus and flu vaccine.   Currently managed on Symbicort 2 puffs BID and is compliant to this regimen. Today he endorses having to use his albuterol inhaler daily for years. He's not sure if Symbicort is helping. Since being off albuterol for the last 1-2 weeks he's noticed not requiring use.   He was once managed on Advair inhaler, but had to stop as insurance would not cover. He now has new insurance. He would like to try Advair again. He's not sure if Advair was more effective than Symbicort but would like to try.   Completed CT chest low does for lung cancer screening in September 2022 which revealed CAD and atherosclerosis, no masses, stable small solid pulmonary nodules, 3 mm ground-glass nodule to right upper lobe.   Follows with cardiology who is aware of calcifications. He denies chest pain. He is compliant to atorvastatin 40 mg, lipid panel from January 2022 with LDL of 44.  BP Readings from Last 3 Encounters:  05/21/21 126/64  04/16/21 120/66  03/03/21 132/64      Review of Systems  Constitutional:  Negative for unexpected weight change.  Respiratory:  Negative for shortness of breath.   Cardiovascular:  Negative for chest pain.        Past Medical History:  Diagnosis Date   Acute colitis 06/26/2019   Diabetes mellitus without complication (HCC)    Hyperlipidemia    Hypertension     Social History   Socioeconomic History   Marital status: Single    Spouse name: Not on file   Number of children: Not on file   Years of education: Not on file   Highest education level: Not on file  Occupational History   Not on file  Tobacco Use   Smoking status: Some Days    Packs/day:  0.50    Years: 25.00    Pack years: 12.50    Types: Cigarettes   Smokeless tobacco: Never  Substance and Sexual Activity   Alcohol use: Yes    Comment: occasionally   Drug use: Never   Sexual activity: Yes  Other Topics Concern   Not on file  Social History Narrative   Not on file   Social Determinants of Health   Financial Resource Strain: Not on file  Food Insecurity: Not on file  Transportation Needs: Not on file  Physical Activity: Not on file  Stress: Not on file  Social Connections: Not on file  Intimate Partner Violence: Not on file    Past Surgical History:  Procedure Laterality Date   HERNIA REPAIR      Family History  Problem Relation Age of Onset   COPD Mother    Heart disease Father    Hyperlipidemia Father    Hypertension Father     No Known Allergies  Current Outpatient Medications on File Prior to Visit  Medication Sig Dispense Refill   albuterol (VENTOLIN HFA) 108 (90 Base) MCG/ACT inhaler USE 1 TO 2 PUFFS EVERY 4 HOURS AS NEEDED WHEEZING OR SHORTNESS OF BREATH (Patient taking differently: Has been out for 2 weeks.) 18 g 0   amLODipine (NORVASC) 10 MG tablet TAKE ONE  TABLET BY MOUTH DAILY FOR BLOOD PRESSURE 90 tablet 3   aspirin EC 81 MG tablet Take 81 mg by mouth daily.     atorvastatin (LIPITOR) 40 MG tablet Take 1 tablet (40 mg total) by mouth daily. For cholesterol. 90 tablet 3   citalopram (CELEXA) 40 MG tablet TAKE ONE TABLET BY MOUTH DAILY FOR ANXIETY 90 tablet 3   lisinopril (ZESTRIL) 20 MG tablet TAKE ONE TABLET BY MOUTH DAILY FOR BLOOD PRESSURE 90 tablet 3   metFORMIN (GLUCOPHAGE) 500 MG tablet TAKE ONE TABLET BY MOUTH TWICE A DAY [facility] MEAL FOR DIABETES 180 tablet 0   Multiple Vitamins-Minerals (MULTIVITAMIN ADULT PO) Take by mouth.     SYMBICORT 160-4.5 MCG/ACT inhaler Inhale 2 puffs into the lungs 2 (two) times daily. 1 each 11   No current facility-administered medications on file prior to visit.    BP 126/64   Pulse 74   Temp 98.6  F (37 C) (Temporal)   Ht 5\' 4"  (1.626 m)   Wt 142 lb (64.4 kg)   SpO2 95%   BMI 24.37 kg/m  Objective:   Physical Exam Cardiovascular:     Rate and Rhythm: Normal rate and regular rhythm.  Pulmonary:     Effort: Pulmonary effort is normal.     Breath sounds: Normal breath sounds. No rales.  Musculoskeletal:     Cervical back: Neck supple.  Skin:    General: Skin is warm and dry.  Neurological:     Mental Status: He is alert and oriented to person, place, and time.          Assessment & Plan:      This visit occurred during the SARS-CoV-2 public health emergency.  Safety protocols were in place, including screening questions prior to the visit, additional usage of staff PPE, and extensive cleaning of exam room while observing appropriate contact time as indicated for disinfecting solutions.

## 2021-05-21 NOTE — Patient Instructions (Addendum)
Stop the Symbicort inhaler.  Start the Advair inhaler, use 1 puff twice daily, everyday.  Only use the albuterol inhaler if you need it.  Please schedule a follow up visit for 6 months for your physical and labs.  It was a pleasure to see you today!        Tdap (Tetanus, Diphtheria, Pertussis) Vaccine: What You Need to Know 1. Why get vaccinated? Tdap vaccine can prevent tetanus, diphtheria, and pertussis. Diphtheria and pertussis spread from person to person. Tetanus enters the body through cuts or wounds. TETANUS (T) causes painful stiffening of the muscles. Tetanus can lead to serious health problems, including being unable to open the mouth, having trouble swallowing and breathing, or death. DIPHTHERIA (D) can lead to difficulty breathing, heart failure, paralysis, or death. PERTUSSIS (aP), also known as "whooping cough," can cause uncontrollable, violent coughing that makes it hard to breathe, eat, or drink. Pertussis can be extremely serious especially in babies and young children, causing pneumonia, convulsions, brain damage, or death. In teens and adults, it can cause weight loss, loss of bladder control, passing out, and rib fractures from severe coughing. 2. Tdap vaccine Tdap is only for children 7 years and older, adolescents, and adults.  Adolescents should receive a single dose of Tdap, preferably at age 59 or 12 years. Pregnant people should get a dose of Tdap during every pregnancy, preferably during the early part of the third trimester, to help protect the newborn from pertussis. Infants are most at risk for severe, life-threatening complications from pertussis. Adults who have never received Tdap should get a dose of Tdap. Also, adults should receive a booster dose of either Tdap or Td (a different vaccine that protects against tetanus and diphtheria but not pertussis) every 10 years, or after 5 years in the case of a severe or dirty wound or burn. Tdap may be given at  the same time as other vaccines. 3. Talk with your health care provider Tell your vaccine provider if the person getting the vaccine: Has had an allergic reaction after a previous dose of any vaccine that protects against tetanus, diphtheria, or pertussis, or has any severe, life-threatening allergies Has had a coma, decreased level of consciousness, or prolonged seizures within 7 days after a previous dose of any pertussis vaccine (DTP, DTaP, or Tdap) Has seizures or another nervous system problem Has ever had Guillain-Barr Syndrome (also called "GBS") Has had severe pain or swelling after a previous dose of any vaccine that protects against tetanus or diphtheria In some cases, your health care provider may decide to postpone Tdap vaccination until a future visit. People with minor illnesses, such as a cold, may be vaccinated. People who are moderately or severely ill should usually wait until they recover before getting Tdap vaccine.  Your health care provider can give you more information. 4. Risks of a vaccine reaction Pain, redness, or swelling where the shot was given, mild fever, headache, feeling tired, and nausea, vomiting, diarrhea, or stomachache sometimes happen after Tdap vaccination. People sometimes faint after medical procedures, including vaccination. Tell your provider if you feel dizzy or have vision changes or ringing in the ears.  As with any medicine, there is a very remote chance of a vaccine causing a severe allergic reaction, other serious injury, or death. 5. What if there is a serious problem? An allergic reaction could occur after the vaccinated person leaves the clinic. If you see signs of a severe allergic reaction (hives, swelling of the face  and throat, difficulty breathing, a fast heartbeat, dizziness, or weakness), call 9-1-1 and get the person to the nearest hospital. For other signs that concern you, call your health care provider.  Adverse reactions should be  reported to the Vaccine Adverse Event Reporting System (VAERS). Your health care provider will usually file this report, or you can do it yourself. Visit the VAERS website at www.vaers.LAgents.no or call (207)642-6975. VAERS is only for reporting reactions, and VAERS staff members do not give medical advice. 6. The National Vaccine Injury Compensation Program The [employer] Vaccine Injury Compensation Program (VICP) is a federal program that was created to compensate people who may have been injured by certain vaccines. Claims regarding alleged injury or death due to vaccination have a time limit for filing, which may be as short as two years. Visit the VICP website at SpiritualWord.at or call 308-463-2246 to learn about the program and about filing a claim. 7. How can I learn more? Ask your health care provider. Call your local or state health department. Visit the website of the Food and Drug Administration (FDA) for vaccine package inserts and additional information at FinderList.no. Contact the Centers for Disease Control and Prevention (CDC): Call 475-517-3940 (1-800-CDC-INFO) or Visit CDC's website at PicCapture.uy. Vaccine Information Statement Tdap (Tetanus, Diphtheria, Pertussis) Vaccine (04/18/2020) This information is not intended to replace advice given to you by your health care provider. Make sure you discuss any questions you have with your health care provider. Document Revised: 05/14/2020 Document Reviewed: 05/14/2020 Elsevier Patient Education  2022 Elsevier Inc.    Influenza (Flu) Vaccine (Inactivated or Recombinant): What You Need to Know 1. Why get vaccinated? Influenza vaccine can prevent influenza (flu). Flu is a contagious disease that spreads around the [place] every year, usually between October and May. Anyone can get the flu, but it is more dangerous for some people. Infants and young children, people 44  years and older, pregnant people, and people with certain health conditions or a weakened immune system are at greatest risk of flu complications. Pneumonia, bronchitis, sinus infections, and ear infections are examples of flu-related complications. If you have a medical condition, such as heart disease, cancer, or diabetes, flu can make it worse. Flu can cause fever and chills, sore throat, muscle aches, fatigue, cough, headache, and runny or stuffy nose. Some people may have vomiting and diarrhea, though this is more common in children than adults. In an average year, thousands of people in the [place] die from flu, and many more are hospitalized. Flu vaccine prevents millions of illnesses and flu-related visits to the doctor each year. 2. Influenza vaccines CDC recommends everyone 6 months and older get vaccinated every flu season. Children 6 months through 48 years of age may need 2 doses during a single flu season. Everyone else needs only 1 dose each flu season. It takes about 2 weeks for protection to develop after vaccination. There are many flu viruses, and they are always changing. Each year a new flu vaccine is made to protect against the influenza viruses believed to be likely to cause disease in the upcoming flu season. Even when the vaccine doesn't exactly match these viruses, it may still provide some protection. Influenza vaccine does not cause flu. Influenza vaccine may be given at the same time as other vaccines. 3. Talk with your health care provider Tell your vaccination provider if the person getting the vaccine: Has had an allergic reaction after a previous dose of influenza vaccine, or has any  severe, life-threatening allergies Has ever had Guillain-Barr Syndrome (also called "GBS") In some cases, your health care provider may decide to postpone influenza vaccination until a future visit. Influenza vaccine can be administered at any time during pregnancy. People who are or  will be pregnant during influenza season should receive inactivated influenza vaccine. People with minor illnesses, such as a cold, may be vaccinated. People who are moderately or severely ill should usually wait until they recover before getting influenza vaccine. Your health care provider can give you more information. 4. Risks of a vaccine reaction Soreness, redness, and swelling where the shot is given, fever, muscle aches, and headache can happen after influenza vaccination. There may be a very small increased risk of Guillain-Barr Syndrome (GBS) after inactivated influenza vaccine (the flu shot). Young children who get the flu shot along with pneumococcal vaccine (PCV13) and/or DTaP vaccine at the same time might be slightly more likely to have a seizure caused by fever. Tell your health care provider if a child who is getting flu vaccine has ever had a seizure. People sometimes faint after medical procedures, including vaccination. Tell your provider if you feel dizzy or have vision changes or ringing in the ears. As with any medicine, there is a very remote chance of a vaccine causing a severe allergic reaction, other serious injury, or death. 5. What if there is a serious problem? An allergic reaction could occur after the vaccinated person leaves the clinic. If you see signs of a severe allergic reaction (hives, swelling of the face and throat, difficulty breathing, a fast heartbeat, dizziness, or weakness), call 9-1-1 and get the person to the nearest hospital. For other signs that concern you, call your health care provider. Adverse reactions should be reported to the Vaccine Adverse Event Reporting System (VAERS). Your health care provider will usually file this report, or you can do it yourself. Visit the VAERS website at www.vaers.LAgents.no or call 248-625-9119. VAERS is only for reporting reactions, and VAERS staff members do not give medical advice. 6. The National Vaccine Injury  Compensation Program The [employer] Vaccine Injury Compensation Program (VICP) is a federal program that was created to compensate people who may have been injured by certain vaccines. Claims regarding alleged injury or death due to vaccination have a time limit for filing, which may be as short as two years. Visit the VICP website at SpiritualWord.at or call 423-788-7771 to learn about the program and about filing a claim. 7. How can I learn more? Ask your health care provider. Call your local or state health department. Visit the website of the Food and Drug Administration (FDA) for vaccine package inserts and additional information at FinderList.no. Contact the Centers for Disease Control and Prevention (CDC): Call 707-801-3157 (1-800-CDC-INFO) or Visit CDC's website at BiotechRoom.com.cy. Vaccine Information Statement Inactivated Influenza Vaccine (04/18/2020) This information is not intended to replace advice given to you by your health care provider. Make sure you discuss any questions you have with your health care provider. Document Revised: 06/05/2020 Document Reviewed: 06/05/2020 Elsevier Patient Education  2022 [employer].

## 2021-05-21 NOTE — Assessment & Plan Note (Signed)
Chronic inappropriate use of SABA, discussed this today. He's been out for two weeks and has not required use.  Will switch to Advair 250-50 mcg inhaler per patient preference.  Refill provided for albuterol, stressed to use this only if needed.  CT chest reviewed from lung cancer screening program, repeat in 12 months.

## 2021-05-29 ENCOUNTER — Encounter: Payer: Self-pay | Admitting: *Deleted

## 2021-05-29 DIAGNOSIS — Z87891 Personal history of nicotine dependence: Secondary | ICD-10-CM

## 2021-05-29 DIAGNOSIS — F17210 Nicotine dependence, cigarettes, uncomplicated: Secondary | ICD-10-CM

## 2021-06-02 LAB — HM COLONOSCOPY

## 2021-06-03 NOTE — Addendum Note (Signed)
Addended by: Donnamarie Poag on: 06/03/2021 11:36 AM   Modules accepted: Orders

## 2021-06-19 NOTE — Progress Notes (Signed)
Please call patient and let them  know their  low dose Ct was read as a Lung RADS 2: nodules that are benign in appearance and behavior with a very low likelihood of becoming a clinically active cancer due to size or lack of growth. Recommendation per radiology is for a repeat LDCT in 12 months. .Please let them  know we will order and schedule their  annual screening scan for 05/2022. Please let them  know there was notation of CAD on their  scan.  Please remind the patient  that this is a non-gated exam therefore degree or severity of disease  cannot be determined. Please have them  follow up with their PCP regarding potential risk factor modification, dietary therapy or pharmacologic therapy if clinically indicated. Pt.  is  currently on statin therapy. Please place order for annual  screening scan for  05/2021 and fax results to PCP. Thanks so much.  + CAD on statin

## 2021-06-23 ENCOUNTER — Other Ambulatory Visit: Payer: Self-pay

## 2021-06-23 DIAGNOSIS — J449 Chronic obstructive pulmonary disease, unspecified: Secondary | ICD-10-CM

## 2021-06-23 MED ORDER — FLUTICASONE-SALMETEROL 250-50 MCG/ACT IN AEPB: 1.0000 | INHALATION_SPRAY | Freq: Two times a day (BID) | RESPIRATORY_TRACT | 4 refills | Status: DC

## 2021-07-02 ENCOUNTER — Encounter: Payer: Self-pay | Admitting: Primary Care

## 2021-07-08 ENCOUNTER — Other Ambulatory Visit: Payer: Self-pay | Admitting: Primary Care

## 2021-07-08 DIAGNOSIS — E119 Type 2 diabetes mellitus without complications: Secondary | ICD-10-CM

## 2021-07-22 DIAGNOSIS — J449 Chronic obstructive pulmonary disease, unspecified: Secondary | ICD-10-CM

## 2021-07-23 MED ORDER — BUDESONIDE-FORMOTEROL FUMARATE 160-4.5 MCG/ACT IN AERO: 2.0000 | INHALATION_SPRAY | Freq: Two times a day (BID) | RESPIRATORY_TRACT | 5 refills | Status: DC

## 2021-09-15 DIAGNOSIS — J449 Chronic obstructive pulmonary disease, unspecified: Secondary | ICD-10-CM

## 2021-09-15 MED ORDER — ALBUTEROL SULFATE HFA 108 (90 BASE) MCG/ACT IN AERS: 1.0000 | INHALATION_SPRAY | Freq: Four times a day (QID) | RESPIRATORY_TRACT | 0 refills | Status: DC | PRN

## 2021-09-23 DIAGNOSIS — J449 Chronic obstructive pulmonary disease, unspecified: Secondary | ICD-10-CM

## 2021-09-23 NOTE — Telephone Encounter (Signed)
Please call patient:  What prescription is he talking about? All prescriptions? Is he requesting that his medications be sent to [facility] rather than [facility]?   Albuterol (rescue inhaler) was sent to [facility] on 09/15/21, is he asking to switch pharmacies to [facility]? Is he using his Symbicort (daily inhaler) everyday?  Also, monthly use of albuterol is NOT best practice and is NOT safe.   He's on several prescriptions so we need more information.

## 2021-09-23 NOTE — Telephone Encounter (Signed)
Roy Richards needs his prescription sent over to [facility] for this month only  [facility] does not have the brand that is prescribed below.   Patient cannot remember name of generic they offer but needs to get it filled  Patient is asking for a return call

## 2021-09-24 MED ORDER — FLUTICASONE FUROATE-VILANTEROL 100-25 MCG/ACT IN AEPB: 1.0000 | INHALATION_SPRAY | Freq: Every day | RESPIRATORY_TRACT | 0 refills | Status: DC

## 2021-09-25 NOTE — Telephone Encounter (Signed)
Left message to return call to our office.  

## 2021-09-29 NOTE — Telephone Encounter (Signed)
Left message to return call to our office.  

## 2021-10-07 ENCOUNTER — Encounter: Payer: 59 | Admitting: Primary Care

## 2021-10-09 NOTE — Telephone Encounter (Signed)
Left message to return call to our office.  Will send my chart message to give office a call back.

## 2021-10-13 ENCOUNTER — Other Ambulatory Visit: Payer: Self-pay

## 2021-10-13 ENCOUNTER — Encounter: Payer: Self-pay | Admitting: Emergency Medicine

## 2021-10-13 ENCOUNTER — Ambulatory Visit
Admission: EM | Admit: 2021-10-13 | Discharge: 2021-10-13 | Disposition: A | Discharge status: Home / Self Care | Payer: Managed Care, Other (non HMO)

## 2021-10-13 DIAGNOSIS — J441 Chronic obstructive pulmonary disease with (acute) exacerbation: Secondary | ICD-10-CM | POA: Diagnosis not present

## 2021-10-13 MED ORDER — AMOXICILLIN-POT CLAVULANATE 875-125 MG PO TABS: 1.0000 | ORAL_TABLET | Freq: Two times a day (BID) | ORAL | 0 refills | Status: DC

## 2021-10-13 MED ORDER — PROMETHAZINE-DM 6.25-15 MG/5ML PO SYRP: 5.0000 mL | ORAL_SOLUTION | Freq: Four times a day (QID) | ORAL | 0 refills | Status: DC | PRN

## 2021-10-13 NOTE — ED Triage Notes (Signed)
Pt c/o cough, congestion, and bodyaches x 1 week.

## 2021-10-13 NOTE — ED Provider Notes (Signed)
Roderic Palau    CSN: [id number] Arrival date & time: 10/13/21  0849      History   Chief Complaint Chief Complaint  Patient presents with   Cough   Generalized Body Aches    HPI Roy Richards is a 61 y.o. male presenting with cough x1 week. Medical history COPD, former smoker.  Describes 1 week of cough, nasal congestion.  Cough is productive of clear and white sputum.  States increase in baseline shortness of breath, particularly walking and talking.  Denies chest pain, dizziness, fever/chills, weakness.  Negative COVID test at onset of symptoms.  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 Authorizing Provider  amoxicillin-clavulanate (AUGMENTIN) 875-125 MG tablet Take 1 tablet by mouth every 12 (twelve) hours. 10/13/21  Yes Hazel Sams, PA-C  promethazine-dextromethorphan (PROMETHAZINE-DM) 6.25-15 MG/5ML syrup Take 5 mLs by mouth 4 (four) times daily as needed for cough. 10/13/21  Yes Hazel Sams, PA-C  albuterol (VENTOLIN HFA) 108 (90 Base) MCG/ACT inhaler Inhale 1-2 puffs into the lungs every 6 (six)  hours as needed for wheezing or shortness of breath. 09/15/21   Pleas Koch, NP  amLODipine (NORVASC) 10 MG tablet 1 tablet    [provider]  aspirin (ASPIRIN 81) 81 MG chewable tablet 1 tablet    [provider]  atorvastatin (LIPITOR) 40 MG tablet 1 tablet    [provider]  budesonide-formoterol (SYMBICORT) 160-4.5 MCG/ACT inhaler Inhale 2 puffs into the lungs 2 (two) times daily. 07/23/21   Pleas Koch, NP  Cholecalciferol (VITAMIN D) 50 MCG (2000 UT) tablet 1 tablet    [provider]  citalopram (CELEXA) 40 MG tablet TAKE ONE TABLET BY MOUTH DAILY FOR ANXIETY 12/29/20   Pleas Koch, NP  fluticasone furoate-vilanterol (BREO ELLIPTA) 100-25 MCG/ACT AEPB Inhale 1 puff into the lungs daily. 09/24/21   Pleas Koch, NP  fluticasone-salmeterol (ADVAIR HFA) [phone] MCG/ACT inhaler 2 puffs    [provider]  lisinopril (ZESTRIL) 20 MG tablet 1 tablet    [provider]  metFORMIN (GLUCOPHAGE) 500 MG tablet 1 tablet with a meal    [provider]  montelukast (SINGULAIR) 10 MG tablet 1 tablet in the evening    [provider]  Multiple Vitamins-Minerals (MULTIVITAMIN ADULT PO) Take by mouth.    [provider]  sildenafil (VIAGRA) 100 MG tablet 1/2 tablet    [provider]  tiotropium (SPIRIVA HANDIHALER) 18 MCG inhalation capsule 1 capsule by mouth    [provider]    Family History Family History  Problem Relation  Age of Onset   COPD Mother    Heart disease Father    Hyperlipidemia Father    Hypertension Father     Social History Social History   Tobacco Use   Smoking status: Some Days    Packs/day: 0.50    Years: 25.00    Pack years: 12.50    Types: Cigarettes   Smokeless tobacco: Never  Vaping Use   Vaping Use: Never used  Substance Use Topics   Alcohol use: Yes    Comment: occasionally   Drug use: Never     Allergies   Patient has no known  allergies.   Review of Systems Review of Systems  Constitutional:  Negative for appetite change, chills and fever.  HENT:  Positive for congestion. Negative for ear pain, rhinorrhea, sinus pressure, sinus pain and sore throat.   Eyes:  Negative for redness and visual disturbance.  Respiratory:  Positive for cough and shortness of breath. Negative for chest tightness and wheezing.   Cardiovascular:  Negative for chest pain and palpitations.  Gastrointestinal:  Negative for abdominal pain, constipation, diarrhea, nausea and vomiting.  Genitourinary:  Negative for dysuria, frequency and urgency.  Musculoskeletal:  Negative for myalgias.  Neurological:  Negative for dizziness, weakness and headaches.  Psychiatric/Behavioral:  Negative for confusion.   All other systems reviewed and are negative.   Physical Exam Triage Vital Signs ED Triage Vitals  Enc Vitals Group     BP 10/13/21 0902 (!) 148/72     Pulse Rate 10/13/21 0902 91     Resp 10/13/21 0902 18     Temp 10/13/21 0902 99.4 F (37.4 C)     Temp Source 10/13/21 0902 Oral     SpO2 10/13/21 0902 90 %     Weight --      Height --      Head Circumference --      Peak Flow --      Pain Score 10/13/21 0904 0     Pain Loc --      Pain Edu? --      Excl. in [place]? --    No data found.  Updated Vital Signs BP (!) 148/72 (BP Location: Right Arm)    Pulse 91    Temp 99.4 F (37.4 C) (Oral)    Resp 18    SpO2 90%   Visual Acuity Right Eye Distance:   Left Eye Distance:   Bilateral Distance:    Right Eye Near:   Left Eye Near:    Bilateral Near:     Physical Exam Vitals reviewed.  Constitutional:      General: He is not in acute distress.    Appearance: Normal appearance. He is not ill-appearing.  HENT:     Head: Normocephalic and atraumatic.     Right Ear: Tympanic membrane, ear canal and external ear normal. No tenderness. No middle ear effusion. There is no impacted cerumen. Tympanic membrane is not perforated,  erythematous, retracted or bulging.     Left Ear: Tympanic membrane, ear canal and external ear normal. No tenderness.  No middle ear effusion. There is no impacted cerumen. Tympanic membrane is not perforated, erythematous, retracted or bulging.     Nose: Congestion present.     Mouth/Throat:     Mouth: Mucous membranes are moist.     Pharynx: Uvula midline. No oropharyngeal exudate or posterior oropharyngeal erythema.  Eyes:     Extraocular Movements: Extraocular movements intact.     Pupils: Pupils  are equal, round, and reactive to light.  Cardiovascular:     Rate and Rhythm: Normal rate and regular rhythm.     Heart sounds: Normal heart sounds.  Pulmonary:     Effort: Pulmonary effort is normal.     Breath sounds: Normal breath sounds. No decreased breath sounds, wheezing, rhonchi or rales.     Comments: Freq cough  Abdominal:     Palpations: Abdomen is soft.     Tenderness: There is no abdominal tenderness. There is no guarding or rebound.  Lymphadenopathy:     Cervical: No cervical adenopathy.     Right cervical: No superficial cervical adenopathy.    Left cervical: No superficial cervical adenopathy.  Neurological:     General: No focal deficit present.     Mental Status: He is alert and oriented to person, place, and time.  Psychiatric:        Mood and Affect: Mood normal.        Behavior: Behavior normal.        Thought Content: Thought content normal.        Judgment: Judgment normal.     UC Treatments / Results  Labs (all labs ordered are listed, but only abnormal results are displayed) Labs Reviewed - No data to display  EKG   Radiology No results found.  Procedures Procedures (including critical care time)  Medications Ordered in UC Medications - No data to display  Initial Impression / Assessment and Plan / UC Course  I have reviewed the triage vital signs and the nursing notes.  Pertinent labs & imaging results that were available during my care of  the patient were reviewed by me and considered in my medical decision making (see chart for details).     This patient is a very pleasant 61 y.o. year old male presenting with COPD exacerbation following virus. Afebrile, nontachy.  Low suspicion for pneumonia given clear lung sounds, vitals in normal range.  Will manage as COPD exacerbation with Augmentin and Promethazine DM, he declines prednisone in favor of cough syrup.  Continue albuterol and steroid inhalers, declines refills on his. ED return precautions discussed. Patient verbalizes understanding and agreement.  Level 4 for acute exacerbation of chronic condition and prescription drug management..   Final Clinical Impressions(s) / UC Diagnoses   Final diagnoses:  COPD exacerbation ([place])     Discharge Instructions      -Start the antibiotic-Augmentin (amoxicillin-clavulanate), 1 pill every 12 hours for 7 days.  You can take this with food like with breakfast and dinner. -Promethazine DM cough syrup for congestion/cough. This could make you drowsy, so take at night before bed. -Albuterol inhaler as needed for cough, wheezing, shortness of breath, 1 to 2 puffs every 6 hours as needed. -Continue steroid inhaler daily  -Follow-up if symptoms worsen: cough, shortness of breath, chest pain, fevers, etc.    ED Prescriptions     Medication Sig Dispense Auth. Provider   amoxicillin-clavulanate (AUGMENTIN) 875-125 MG tablet Take 1 tablet by mouth every 12 (twelve) hours. 14 tablet Hazel Sams, PA-C   promethazine-dextromethorphan (PROMETHAZINE-DM) 6.25-15 MG/5ML syrup Take 5 mLs by mouth 4 (four) times daily as needed for cough. 118 mL Hazel Sams, PA-C      PDMP not reviewed this encounter.   Hazel Sams, PA-C 10/13/21 [phone]

## 2021-10-13 NOTE — Discharge Instructions (Signed)
-  Start the antibiotic-Augmentin (amoxicillin-clavulanate), 1 pill every 12 hours for 7 days.  You can take this with food like with breakfast and dinner. -Promethazine DM cough syrup for congestion/cough. This could make you drowsy, so take at night before bed. -Albuterol inhaler as needed for cough, wheezing, shortness of breath, 1 to 2 puffs every 6 hours as needed. -Continue steroid inhaler daily  -Follow-up if symptoms worsen: cough, shortness of breath, chest pain, fevers, etc.

## 2021-10-16 ENCOUNTER — Other Ambulatory Visit: Payer: Self-pay | Admitting: Primary Care

## 2021-10-16 DIAGNOSIS — I10 Essential (primary) hypertension: Secondary | ICD-10-CM

## 2021-10-16 DIAGNOSIS — J449 Chronic obstructive pulmonary disease, unspecified: Secondary | ICD-10-CM

## 2021-10-16 DIAGNOSIS — E785 Hyperlipidemia, unspecified: Secondary | ICD-10-CM

## 2021-10-16 NOTE — Telephone Encounter (Signed)
Pt called back returning your call °

## 2021-10-17 ENCOUNTER — Other Ambulatory Visit: Payer: Self-pay | Admitting: Primary Care

## 2021-10-17 DIAGNOSIS — J449 Chronic obstructive pulmonary disease, unspecified: Secondary | ICD-10-CM

## 2021-11-10 ENCOUNTER — Other Ambulatory Visit: Payer: Self-pay | Admitting: Primary Care

## 2021-11-10 DIAGNOSIS — J449 Chronic obstructive pulmonary disease, unspecified: Secondary | ICD-10-CM

## 2021-11-18 ENCOUNTER — Encounter: Payer: Self-pay | Admitting: Primary Care

## 2021-11-18 ENCOUNTER — Other Ambulatory Visit: Payer: Self-pay

## 2021-11-18 ENCOUNTER — Ambulatory Visit (INDEPENDENT_AMBULATORY_CARE_PROVIDER_SITE_OTHER): Payer: Medicare HMO | Admitting: Primary Care

## 2021-11-18 VITALS — BP 138/64 | HR 73 | Temp 98.6°F | Ht 64.0 in | Wt 154.0 lb

## 2021-11-18 DIAGNOSIS — F32A Depression, unspecified: Secondary | ICD-10-CM

## 2021-11-18 DIAGNOSIS — I708 Atherosclerosis of other arteries: Secondary | ICD-10-CM

## 2021-11-18 DIAGNOSIS — I10 Essential (primary) hypertension: Secondary | ICD-10-CM | POA: Diagnosis not present

## 2021-11-18 DIAGNOSIS — E785 Hyperlipidemia, unspecified: Secondary | ICD-10-CM | POA: Diagnosis not present

## 2021-11-18 DIAGNOSIS — I701 Atherosclerosis of renal artery: Secondary | ICD-10-CM | POA: Diagnosis not present

## 2021-11-18 DIAGNOSIS — J449 Chronic obstructive pulmonary disease, unspecified: Secondary | ICD-10-CM

## 2021-11-18 DIAGNOSIS — Z0000 Encounter for general adult medical examination without abnormal findings: Secondary | ICD-10-CM | POA: Diagnosis not present

## 2021-11-18 DIAGNOSIS — Z125 Encounter for screening for malignant neoplasm of prostate: Secondary | ICD-10-CM | POA: Diagnosis not present

## 2021-11-18 DIAGNOSIS — F419 Anxiety disorder, unspecified: Secondary | ICD-10-CM

## 2021-11-18 DIAGNOSIS — N529 Male erectile dysfunction, unspecified: Secondary | ICD-10-CM | POA: Insufficient documentation

## 2021-11-18 DIAGNOSIS — E1159 Type 2 diabetes mellitus with other circulatory complications: Secondary | ICD-10-CM | POA: Diagnosis not present

## 2021-11-18 DIAGNOSIS — I6523 Occlusion and stenosis of bilateral carotid arteries: Secondary | ICD-10-CM

## 2021-11-18 LAB — COMPREHENSIVE METABOLIC PANEL
ALT: 25 U/L (ref 0–53)
AST: 21 U/L (ref 0–37)
Albumin: 4.6 g/dL (ref 3.5–5.2)
Alkaline Phosphatase: 87 U/L (ref 39–117)
BUN: 16 mg/dL (ref 6–23)
CO2: 32 mEq/L (ref 19–32)
Calcium: 9.5 mg/dL (ref 8.4–10.5)
Chloride: 101 mEq/L (ref 96–112)
Creatinine, Ser: 0.84 mg/dL (ref 0.40–1.50)
GFR: 93.51 mL/min (ref >60.00)
Glucose, Bld: 157 mg/dL — ABNORMAL HIGH (ref 70–99)
Potassium: 4.2 mEq/L (ref 3.5–5.1)
Sodium: 141 mEq/L (ref 135–145)
Total Bilirubin: 0.6 mg/dL (ref 0.2–1.2)
Total Protein: 6.9 g/dL (ref 6.0–8.3)

## 2021-11-18 LAB — LIPID PANEL
Cholesterol: 129 mg/dL (ref 0–200)
HDL: 51 mg/dL (ref >39.00)
LDL Cholesterol: 57 mg/dL (ref 0–99)
NonHDL: 78.27
Total CHOL/HDL Ratio: 3
Triglycerides: 105 mg/dL (ref 0.0–149.0)
VLDL: 21 mg/dL (ref 0.0–40.0)

## 2021-11-18 LAB — CBC
HCT: 48.2 % (ref 39.0–52.0)
Hemoglobin: 15.6 g/dL (ref 13.0–17.0)
MCHC: 32.3 g/dL (ref 30.0–36.0)
MCV: 93.1 fl (ref 78.0–100.0)
Platelets: 216 10*3/uL (ref 150.0–400.0)
RBC: 5.17 Mil/uL (ref 4.22–5.81)
RDW: 13.6 % (ref 11.5–15.5)
WBC: 8.4 10*3/uL (ref 4.0–10.5)

## 2021-11-18 LAB — PSA: PSA: 0.15 ng/mL (ref 0.10–4.00)

## 2021-11-18 LAB — HEMOGLOBIN A1C: Hgb A1c MFr Bld: 6.5 % (ref 4.6–6.5)

## 2021-11-18 MED ORDER — FLUTICASONE-SALMETEROL 250-50 MCG/ACT IN AEPB: 1.0000 | INHALATION_SPRAY | Freq: Two times a day (BID) | RESPIRATORY_TRACT | 3 refills | Status: DC

## 2021-11-18 MED ORDER — ALBUTEROL SULFATE HFA 108 (90 BASE) MCG/ACT IN AERS: 1.0000 | INHALATION_SPRAY | Freq: Four times a day (QID) | RESPIRATORY_TRACT | 0 refills | Status: DC | PRN

## 2021-11-18 NOTE — Progress Notes (Signed)
? ?Subjective:  ? ? Patient ID: Roy Richards, male    DOB: 02/09/1961, 61 y.o.   MRN: [id number] ? ?HPI ? ?Roy Richards is a very pleasant 61 y.o. male who presents today for complete physical and follow up of chronic conditions. ? ?He's not using any of his maintenance inhalers, is using albuterol inhaler daily most each morning for chest tightness. He has been switching insurance companies over the last several months. He's not sure if he's noticed a difference on Breo. He thinks Advair inhaler was effective, needs a new prescription.  ? ?Immunizations: ?-Tetanus: 2022 ?-Influenza: Completed this season  ?-Covid-19: 2 vaccines ?-Shingles: Has not completed, declines  ?-Pneumonia: Pneumovax in 2022 ? ?Diet: Fair diet.  ?Exercise: No regular exercise.  ? ?Eye exam: Completed several years ago   ?Dental exam: Completes semi-annually  ? ?Colonoscopy: Completed in 2022, due 2025 ?PSA: Due ? ?Lung Cancer Screening: Completed in September 2022, due September 2023  ? ?BP Readings from Last 3 Encounters:  ?11/18/21 138/64  ?10/13/21 (!) 148/72  ?05/21/21 126/64  ? ? ? ?Review of Systems  ?Constitutional:  Negative for unexpected weight change.  ?HENT:  Negative for rhinorrhea.   ?Respiratory:  Positive for shortness of breath. Negative for cough.   ?Cardiovascular:  Negative for chest pain.  ?Gastrointestinal:  Negative for constipation and diarrhea.  ?Genitourinary:  Negative for difficulty urinating.  ?Musculoskeletal:  Positive for arthralgias.  ?Skin:  Negative for rash.  ?Allergic/Immunologic: Negative for environmental allergies.  ?Neurological:  Negative for dizziness and headaches.  ?Psychiatric/Behavioral:  The patient is not nervous/anxious.   ? ?   ? ? ?Past Medical History:  ?Diagnosis Date  ? Acute colitis 06/26/2019  ? Diabetes mellitus without complication ([facility])   ? Hyperlipidemia   ? Hypertension   ? ? ?Social History  ? ?Socioeconomic History  ? Marital status: Single  ?  Spouse name: Not on file  ? Number of  children: Not on file  ? Years of education: Not on file  ? Highest education level: Not on file  ?Occupational History  ? Not on file  ?Tobacco Use  ? Smoking status: Some Days  ?  Packs/day: 0.50  ?  Years: 25.00  ?  Pack years: 12.50  ?  Types: Cigarettes  ? Smokeless tobacco: Never  ?Vaping Use  ? Vaping Use: Never used  ?Substance and Sexual Activity  ? Alcohol use: Yes  ?  Comment: occasionally  ? Drug use: Never  ? Sexual activity: Yes  ?Other Topics Concern  ? Not on file  ?Social History Narrative  ? Not on file  ? ?Social Determinants of Health  ? ?Financial Resource Strain: Not on file  ?Food Insecurity: Not on file  ?Transportation Needs: Not on file  ?Physical Activity: Not on file  ?Stress: Not on file  ?Social Connections: Not on file  ?Intimate Partner Violence: Not on file  ? ? ?Past Surgical History:  ?Procedure Laterality Date  ? HERNIA REPAIR    ? ? ?Family History  ?Problem Relation Age of Onset  ? COPD Mother   ? Heart disease Father   ? Hyperlipidemia Father   ? Hypertension Father   ? ? ?No Known Allergies ? ?Current Outpatient Medications on File Prior to Visit  ?Medication Sig Dispense Refill  ? albuterol (VENTOLIN HFA) 108 (90 Base) MCG/ACT inhaler Inhale 1-2 puffs into the lungs every 6 (six) hours as needed for wheezing or shortness of breath. 18 g 0  ?  amLODipine (NORVASC) 10 MG tablet 1 tablet    ? amoxicillin-clavulanate (AUGMENTIN) 875-125 MG tablet Take 1 tablet by mouth every 12 (twelve) hours. 14 tablet 0  ? aspirin (ASPIRIN 81) 81 MG chewable tablet 1 tablet    ? atorvastatin (LIPITOR) 40 MG tablet Take 1 tablet (40 mg total) by mouth daily. Office visit required for further refills. 90 tablet 0  ? budesonide-formoterol (SYMBICORT) 160-4.5 MCG/ACT inhaler Inhale 2 puffs into the lungs 2 (two) times daily. 1 each 5  ? Cholecalciferol (VITAMIN D) 50 MCG (2000 UT) tablet 1 tablet    ? citalopram (CELEXA) 40 MG tablet TAKE ONE TABLET BY MOUTH DAILY FOR ANXIETY 90 tablet 3  ?  fluticasone furoate-vilanterol (BREO ELLIPTA) 100-25 MCG/ACT AEPB Inhale 1 puff into the lungs daily. 1 each 0  ? fluticasone-salmeterol (ADVAIR HFA) 45-21 MCG/ACT inhaler 2 puffs    ? lisinopril (ZESTRIL) 20 MG tablet Take 1 tablet (20 mg total) by mouth daily. Office visit required for further refills. 90 tablet 0  ? metFORMIN (GLUCOPHAGE) 500 MG tablet 1 tablet with a meal    ? montelukast (SINGULAIR) 10 MG tablet 1 tablet in the evening    ? Multiple Vitamins-Minerals (MULTIVITAMIN ADULT PO) Take by mouth.    ? promethazine-dextromethorphan (PROMETHAZINE-DM) 6.25-15 MG/5ML syrup Take 5 mLs by mouth 4 (four) times daily as needed for cough. 118 mL 0  ? sildenafil (VIAGRA) 100 MG tablet 1/2 tablet    ? tiotropium (SPIRIVA HANDIHALER) 18 MCG inhalation capsule 1 capsule by mouth    ? ?No current facility-administered medications on file prior to visit.  ? ? ?BP 138/64   Pulse 73   Temp 98.6 ?F (37 ?C) (Temporal)   Ht 5\' 4"  (1.626 m)   Wt 154 lb (69.9 kg)   SpO2 93%   BMI 26.43 kg/m?  ?Objective:  ? Physical Exam ?HENT:  ?   Right Ear: Tympanic membrane and ear canal normal.  ?   Left Ear: Tympanic membrane and ear canal normal.  ?   Nose: Nose normal.  ?   Right Sinus: No maxillary sinus tenderness or frontal sinus tenderness.  ?   Left Sinus: No maxillary sinus tenderness or frontal sinus tenderness.  ?Eyes:  ?   Conjunctiva/sclera: Conjunctivae normal.  ?Neck:  ?   Thyroid: No thyromegaly.  ?   Vascular: No carotid bruit.  ?Cardiovascular:  ?   Rate and Rhythm: Normal rate and regular rhythm.  ?   Heart sounds: Normal heart sounds.  ?Pulmonary:  ?   Effort: Pulmonary effort is normal.  ?   Breath sounds: Normal breath sounds. No wheezing or rales.  ?Abdominal:  ?   General: Bowel sounds are normal.  ?   Palpations: Abdomen is soft.  ?   Tenderness: There is no abdominal tenderness.  ?Musculoskeletal:  ?   Right shoulder: Decreased range of motion.  ?   Cervical back: Neck supple.  ?Skin: ?   General: Skin  is warm and dry.  ?Neurological:  ?   Mental Status: He is alert and oriented to person, place, and time.  ?   Cranial Nerves: No cranial nerve deficit.  ?   Deep Tendon Reflexes: Reflexes are normal and symmetric.  ?Psychiatric:     ?   Mood and Affect: Mood normal.  ? ? ? ? ? ?   ?Assessment & Plan:  ? ? ? ? ?This visit occurred during the SARS-CoV-2 public health emergency.  Safety protocols were in  place, including screening questions prior to the visit, additional usage of staff PPE, and extensive cleaning of exam room while observing appropriate contact time as indicated for disinfecting solutions.  ?

## 2021-11-18 NOTE — Assessment & Plan Note (Signed)
He declines follow-up with vascular surgery despite recommendations.  He also declines additional imaging despite recommendations. ? ?Continue atorvastatin 40 mg daily and aspirin 81 mg daily. ?

## 2021-11-18 NOTE — Assessment & Plan Note (Signed)
No recent A1c on file. ?Repeat A1c pending today. ? ?Continue metformin 500 mg twice daily. ?

## 2021-11-18 NOTE — Assessment & Plan Note (Addendum)
Blood pressure borderline today. ? ?He declines follow-up with vascular surgery.  He also declines further imaging despite recommendations.  Discussed to monitor blood pressure at home and notify if readings remain at or above 130/90. ? ?Interestingly he is on lisinopril 20 mg for which he has been on prior to establishing care with [place]. ? ?CMP pending. ? ?

## 2021-11-18 NOTE — Patient Instructions (Signed)
Start using Advair inhaler, 1 puff twice daily, every day for breathing. ? ?Only use the albuterol (rescue inhaler) if needed. ? ?Stop by the lab prior to leaving today. I will notify you of your results once received.  ? ?It was a pleasure to see you today! ? ?

## 2021-11-18 NOTE — Assessment & Plan Note (Signed)
Repeat lipid panel pending.  Continue atorvastatin 40 mg daily. LDL goal of less than 70 

## 2021-11-18 NOTE — Assessment & Plan Note (Signed)
Controlled.  Continue citalopram 40 mg daily. 

## 2021-11-18 NOTE — Assessment & Plan Note (Signed)
Declines Shingrix vaccines, other vaccines up-to-date. ?PSA due and pending. ?Lung cancer screening up-to-date. ?Colonoscopy up-to-date, due 2025. ? ?Encouraged healthy diet and regular exercise. ? ?Exam today stable, labs pending. ?

## 2021-11-18 NOTE — Assessment & Plan Note (Signed)
Appears uncontrolled given his daily use of albuterol. ? ?Discussed the importance and purpose of maintenance inhalers.  Prescription for Advair 250-50 mcg sent to pharmacy for daily use.  Refill provided for albuterol inhaler. ? ?Discussed to notify me if Advair is not effective, and he continues to require daily use of his albuterol inhaler. ?

## 2021-11-18 NOTE — Assessment & Plan Note (Signed)
Patient declines further imaging despite recommendations. ? ?Continue atorvastatin 40 mg, aspirin 81 mg. ?

## 2021-11-18 NOTE — Assessment & Plan Note (Signed)
Borderline high today. ?He has been asked to monitor blood pressure at home and notify if readings are greater than 130/90 consistently ? ?Continue amlodipine 10 mg daily, lisinopril 20 mg daily. ? ?CMP pending. ?

## 2021-11-18 NOTE — Assessment & Plan Note (Signed)
Previously following with urology, no recent visit. ? ?Continue sildenafil 50 to 100 mg as needed ?

## 2021-11-23 ENCOUNTER — Ambulatory Visit: Payer: Medicare HMO | Admitting: Family Medicine

## 2021-11-25 ENCOUNTER — Other Ambulatory Visit: Payer: Self-pay

## 2021-11-25 ENCOUNTER — Ambulatory Visit (INDEPENDENT_AMBULATORY_CARE_PROVIDER_SITE_OTHER): Payer: Medicare HMO | Admitting: Family Medicine

## 2021-11-25 VITALS — BP 120/58 | HR 51 | Temp 98.4°F | Ht 64.0 in | Wt 154.1 lb

## 2021-11-25 DIAGNOSIS — M7501 Adhesive capsulitis of right shoulder: Secondary | ICD-10-CM | POA: Diagnosis not present

## 2021-11-25 NOTE — Progress Notes (Signed)
? ? ?Roy Giaimo T. Cyree Chuong, MD, CAQ Sports Medicine ?Nature conservation officer at [facility] ?[address] [place] ?[place], [address] ? ?Phone: [phone]  FAX: [phone] ? ?Roy Richards - 61 y.o. male  MRN [id number]  Date of Birth: 23-Jun-1961 ? ?Date: 11/25/2021  PCP: [name], NP  Referral: [name], NP ? ?Chief Complaint  ?Patient presents with  ? Shoulder Pain  ?  R x 4 months. Progressively worse over time and has constant pain.   ? ? ?This visit occurred during the SARS-CoV-2 public health emergency.  Safety protocols were in place, including screening questions prior to the visit, additional usage of staff PPE, and extensive cleaning of exam room while observing appropriate contact time as indicated for disinfecting solutions.  ? ?Subjective:  ? ?Roy Richards is a 61 y.o. very pleasant male patient who presents with the following: shoulder pain ? ?The patient noted above presents with R shoulder pain that has been ongoing for 4-5 mo. ?there is no history of trauma or accident. ?The patient denies neck pain or radicular symptoms. No shoulder blade pain ?Denies dislocation, subluxation, separation of the shoulder. ?The patient does complain of pain with flexion, abduction, and terminal motion.  ?Significant restriction of motion. ?he describes a deep ache around the shoulder, and sometimes it will wake the patient up at night. ? ?About two years ago, shoulder started to hurt.  Then it got better.  No str change ? ?No numbness. ? 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 ? ?IROM is bad ?Limited motion.  ?Medications Tried: OTC only ?Ice or Heat: minimal help ?Tried PT: No ? ?Prior shoulder Injury: No ?Prior surgery: No ?Prior fracture: No ? ? ?Review of Systems is noted in the HPI, as appropriate ? ?Objective:  ? ?Blood pressure (!) 120/58, pulse (!) 51, temperature 98.4 ?F (36.9 ?C), temperature source Oral, height 5\' 4"  (1.626 m), weight 154 lb 2 oz (69.9  kg), SpO2 92 %. ? ? ?Shoulder: R and L ?Inspection: No muscle wasting or winging ?Ecchymosis/edema: neg  ?AC joint, scapula, clavicle: NT ?Cervical spine: NT, full ROM ?Spurling's: neg ?ABNORMAL SIDE TESTED: R ?UNLESS OTHERWISE NOTED, THE CONTRALATERAL SIDE HAS FULL RANGE OF MOTION. ?Abduction: 5/5, LIMITED TO 115 DEGREES ?Flexion: 5/5, LIMITED TO 130 DEGNO ROM  ?IR, lift-off: 5/5. TESTED AT 90 DEGREES OF ABDUCTION, LIMITED TO 5 DEGREES ?ER at neutral:  5/5, TESTED AT 90 DEGREES OF ABDUCTION, LIMITED TO 15 DEGREES ?AC crossover and compression: PAIN ?Drop Test: neg ?Empty Can: neg ?Supraspinatus insertion: NT ?Bicipital groove: NT ?ALL OTHER SPECIAL TESTING EQUIVOCAL GIVEN LOSS OF MOTION ?C5-T1 intact ?Sensation intact ?Grip 5/5  ? ?Assessment and Plan:  ? ?  ICD-10-CM   ?1. Adhesive capsulitis of right shoulder  M75.01   ?  ? ? ?Patient was given a systematic ROM protocol from [employer] to be done daily. Emphasized importance of adherence, help of PT, daily HEP. ? ?The average length of total symptoms is 12-18 months going through 3 different phases in the freezing and thawing process. There can be a permanent loss of motion in some cases.  ? ?Intraarticular shoulder injections discussed with patient, which have good evidence for accelerating the thawing phase, but we will start with HEP. ? ?Patient will be sent for formal PT for aggressive frozen shoulder ROM if sx persist. ?Will need RTC str and scapular stabilization to fix underlying mechanics. ? ?I appreciate the opportunity to evaluate this very  friendly patient. If you have any question regarding his care or prognosis, do not hesitate to ask. ? ?Social:  limiting exercise and daily activities due to pain ? ?Follow-up: Return in about 2 months (around 01/25/2022). ? ?No orders of the defined types were placed in this encounter. ? ?There are no discontinued medications. ?No orders of the defined types were placed in this encounter. ? ? ?Signed, ? ?Harshika Mago T.  Bogdan Vivona, MD ? ? ?Outpatient Encounter Medications as of 11/25/2021  ?Medication Sig  ? albuterol (VENTOLIN HFA) 108 (90 Base) MCG/ACT inhaler Inhale 1-2 puffs into the lungs every 6 (six) hours as needed for wheezing or shortness of breath.  ? amLODipine (NORVASC) 10 MG tablet 1 tablet  ? aspirin 81 MG chewable tablet 1 tablet  ? atorvastatin (LIPITOR) 40 MG tablet Take 1 tablet (40 mg total) by mouth daily. Office visit required for further refills.  ? Cholecalciferol (VITAMIN D) 50 MCG (2000 UT) tablet 1 tablet  ? citalopram (CELEXA) 40 MG tablet TAKE ONE TABLET BY MOUTH DAILY FOR ANXIETY  ? fluticasone-salmeterol (ADVAIR DISKUS) 250-50 MCG/ACT AEPB Inhale 1 puff into the lungs in the morning and at bedtime.  ? lisinopril (ZESTRIL) 20 MG tablet Take 1 tablet (20 mg total) by mouth daily. Office visit required for further refills.  ? metFORMIN (GLUCOPHAGE) 500 MG tablet Take 500 mg by mouth 2 (two) times daily.  ? montelukast (SINGULAIR) 10 MG tablet 1 tablet in the evening  ? Multiple Vitamins-Minerals (MULTIVITAMIN ADULT PO) Take by mouth.  ? sildenafil (VIAGRA) 100 MG tablet 1/2 tablet  ? ?No facility-administered encounter medications on file as of 11/25/2021.  ?  ?

## 2021-11-26 ENCOUNTER — Encounter: Payer: Self-pay | Admitting: Family Medicine

## 2022-01-12 ENCOUNTER — Other Ambulatory Visit: Payer: Self-pay | Admitting: Primary Care

## 2022-01-12 DIAGNOSIS — I10 Essential (primary) hypertension: Secondary | ICD-10-CM

## 2022-01-27 ENCOUNTER — Ambulatory Visit: Payer: Medicare Other | Admitting: Family Medicine

## 2022-02-20 ENCOUNTER — Other Ambulatory Visit: Payer: Self-pay | Admitting: Primary Care

## 2022-02-20 DIAGNOSIS — I10 Essential (primary) hypertension: Secondary | ICD-10-CM

## 2022-02-20 DIAGNOSIS — E785 Hyperlipidemia, unspecified: Secondary | ICD-10-CM

## 2022-02-20 DIAGNOSIS — F32A Depression, unspecified: Secondary | ICD-10-CM

## 2022-02-20 DIAGNOSIS — E1159 Type 2 diabetes mellitus with other circulatory complications: Secondary | ICD-10-CM

## 2022-02-20 NOTE — Telephone Encounter (Signed)
Patient needs diabetes follow up scheduled for September 2023. Please schedule.

## 2022-02-23 NOTE — Telephone Encounter (Signed)
Called patient follow up made as directed. No further action needed.

## 2022-03-15 DIAGNOSIS — H524 Presbyopia: Secondary | ICD-10-CM | POA: Diagnosis not present

## 2022-03-25 ENCOUNTER — Ambulatory Visit
Admission: EM | Admit: 2022-03-25 | Discharge: 2022-03-25 | Disposition: A | Discharge status: Home / Self Care | Payer: Medicare Other

## 2022-03-25 ENCOUNTER — Encounter: Payer: Self-pay | Admitting: Emergency Medicine

## 2022-03-25 DIAGNOSIS — H6123 Impacted cerumen, bilateral: Secondary | ICD-10-CM

## 2022-03-25 NOTE — ED Triage Notes (Signed)
Pt c/o left ear pain x 2 days  

## 2022-03-25 NOTE — ED Provider Notes (Signed)
[name]    CSN: [id number] Arrival date & time: 03/25/22  0835      History   Chief Complaint Chief Complaint  Patient presents with   Otalgia    HPI Roy Richards is a 61 y.o. male.   HPI Patient with a history of diabetes, hyperlipidemia and hypertension presents today with a 2-day history of left ear fullness.   Past Medical History:  Diagnosis Date   Acute colitis 06/26/2019   Diabetes mellitus without complication (HCC)    Hyperlipidemia    Hypertension     Patient Active Problem List   Diagnosis Date Noted   Erectile dysfunction 11/18/2021   Preventative health care 10/10/2020   Cerumen impaction 10/10/2020   Hypoxia 05/07/2020   Hyperlipidemia 10/08/2019   Anxiety and depression 07/31/2019   History of tobacco abuse 07/31/2019   Renal artery stenosis (HCC) 07/26/2019   Left subclavian artery occlusion 07/22/2019   Accelerated hypertension 07/22/2019   Diabetes (HCC) 07/22/2019   Carotid stenosis 07/19/2019   Aortic atherosclerosis (HCC) 06/26/2019   Essential hypertension 06/26/2019   COPD (chronic obstructive pulmonary disease) (HCC) 06/26/2019    Past Surgical History:  Procedure Laterality Date   HERNIA REPAIR         Home Medications    Prior to Admission medications   Medication Sig Start Date End Date Taking? Authorizing Provider  albuterol (VENTOLIN HFA) 108 (90 Base) MCG/ACT inhaler Inhale 1-2 puffs into the lungs every 6 (six) hours as needed for wheezing or shortness of breath. 11/18/21   [name], NP  amLODipine (NORVASC) 10 MG tablet TAKE ONE TABLET BY MOUTH DAILY FOR BLOOD PRESSURE 01/13/22   [name], NP  aspirin 81 MG chewable tablet 1 tablet    [provider]  atorvastatin (LIPITOR) 40 MG tablet Take 1 tablet (40 mg total) by mouth daily. for cholesterol. 02/20/22   [name], NP  Cholecalciferol (VITAMIN D) 50 MCG (2000 UT) tablet 1 tablet    [provider]  citalopram  (CELEXA) 40 MG tablet TAKE ONE TABLET BY MOUTH DAILY FOR ANXIETY 02/20/22   [name], NP  fluticasone-salmeterol (ADVAIR DISKUS) 250-50 MCG/ACT AEPB Inhale 1 puff into the lungs in the morning and at bedtime. 11/18/21   [name], NP  lisinopril (ZESTRIL) 20 MG tablet TAKE ONE TABLET BY MOUTH DAILY 02/20/22   [name], NP  metFORMIN (GLUCOPHAGE) 500 MG tablet TAKE ONE TABLET BY MOUTH TWICE A DAY WITH A MEAL FOR DIABETES 02/20/22   [name], NP  montelukast (SINGULAIR) 10 MG tablet 1 tablet in the evening    [provider]  Multiple Vitamins-Minerals (MULTIVITAMIN ADULT PO) Take by mouth.    [provider]  sildenafil (VIAGRA) 100 MG tablet 1/2 tablet    [provider]    Family History Family History  Problem Relation Age of Onset   COPD Mother    Heart disease Father    Hyperlipidemia Father    Hypertension Father     Social History Social History   Tobacco Use   Smoking status: Some Days    Packs/day: 0.50    Years: 25.00    Total pack years: 12.50    Types: Cigarettes   Smokeless tobacco: Never  Vaping Use   Vaping Use: Never used  Substance Use Topics   Alcohol use: Yes    Comment: occasionally   Drug use: Never     Allergies  Patient has no known allergies.   Review of Systems Review of Systems Pertinent negatives listed in HPI   Physical Exam Triage Vital Signs ED Triage Vitals  Enc Vitals Group     BP 03/25/22 0846 127/65     Pulse Rate 03/25/22 0846 66     Resp 03/25/22 0846 18     Temp 03/25/22 0846 98 F (36.7 C)     Temp Source 03/25/22 0846 Oral     SpO2 03/25/22 0846 92 %     Weight --      Height --      Head Circumference --      Peak Flow --      Pain Score 03/25/22 0848 1     Pain Loc --      Pain Edu? --      Excl. in GC? --    No data found.  Updated Vital Signs BP 127/65 (BP Location: Left Arm)   Pulse 66   Temp 98 F (36.7 C) (Oral)   Resp 18   SpO2 92%    Visual Acuity Right Eye Distance:   Left Eye Distance:   Bilateral Distance:    Right Eye Near:   Left Eye Near:    Bilateral Near:     Physical Exam Constitutional:      Appearance: Normal appearance.  HENT:     Head: Normocephalic and atraumatic.     Right Ear: Decreased hearing noted. There is impacted cerumen.     Left Ear: Decreased hearing noted. There is impacted cerumen.     Ears:     Comments: Post ear lavage, TM's bilaterally visible.  Cardiovascular:     Rate and Rhythm: Normal rate and regular rhythm.  Pulmonary:     Effort: Pulmonary effort is normal.     Breath sounds: Normal breath sounds.  Skin:    Capillary Refill: Capillary refill takes less than 2 seconds.  Neurological:     General: No focal deficit present.     Mental Status: He is alert.      UC Treatments / Results  Labs (all labs ordered are listed, but only abnormal results are displayed) Labs Reviewed - No data to display  EKG   Radiology No results found.  Procedures Procedures (including critical care time)  Medications Ordered in UC Medications - No data to display  Initial Impression / Assessment and Plan / UC Course  I have reviewed the triage vital signs and the nursing notes.  Pertinent labs & imaging results that were available during my care of the patient were reviewed by me and considered in my medical decision making (see chart for details).    Bilateral impacted cerumen, RN performed ear lavage. Cerumen successfully remove from both ears. Patient tolerated. Return as needed.  Final Clinical Impressions(s) / UC Diagnoses   Final diagnoses:  Bilateral impacted cerumen   Discharge Instructions   None    ED Prescriptions   None    PDMP not reviewed this encounter.   Bing Neighbors, FNP 03/28/22 1113

## 2022-03-30 DIAGNOSIS — J449 Chronic obstructive pulmonary disease, unspecified: Secondary | ICD-10-CM

## 2022-04-01 MED ORDER — ALBUTEROL SULFATE HFA 108 (90 BASE) MCG/ACT IN AERS: 1.0000 | INHALATION_SPRAY | Freq: Four times a day (QID) | RESPIRATORY_TRACT | 0 refills | Status: DC | PRN

## 2022-05-17 ENCOUNTER — Other Ambulatory Visit: Payer: Self-pay | Admitting: Primary Care

## 2022-05-17 DIAGNOSIS — E1159 Type 2 diabetes mellitus with other circulatory complications: Secondary | ICD-10-CM

## 2022-05-17 NOTE — Telephone Encounter (Signed)
Refills sent to pharmacy. 

## 2022-05-19 ENCOUNTER — Ambulatory Visit
Admission: RE | Admit: 2022-05-19 | Discharge: 2022-05-19 | Disposition: A | Discharge status: Home / Self Care | Payer: Medicare HMO | Source: Ambulatory Visit | Attending: Acute Care | Admitting: Acute Care

## 2022-05-19 DIAGNOSIS — F17210 Nicotine dependence, cigarettes, uncomplicated: Secondary | ICD-10-CM | POA: Insufficient documentation

## 2022-05-19 DIAGNOSIS — Z87891 Personal history of nicotine dependence: Secondary | ICD-10-CM

## 2022-05-19 DIAGNOSIS — J439 Emphysema, unspecified: Secondary | ICD-10-CM | POA: Diagnosis not present

## 2022-05-19 DIAGNOSIS — I700 Atherosclerosis of aorta: Secondary | ICD-10-CM | POA: Insufficient documentation

## 2022-05-19 DIAGNOSIS — Z122 Encounter for screening for malignant neoplasm of respiratory organs: Secondary | ICD-10-CM | POA: Diagnosis not present

## 2022-05-19 DIAGNOSIS — K8020 Calculus of gallbladder without cholecystitis without obstruction: Secondary | ICD-10-CM | POA: Insufficient documentation

## 2022-05-21 ENCOUNTER — Other Ambulatory Visit: Payer: Self-pay

## 2022-05-21 DIAGNOSIS — Z122 Encounter for screening for malignant neoplasm of respiratory organs: Secondary | ICD-10-CM

## 2022-05-21 DIAGNOSIS — Z87891 Personal history of nicotine dependence: Secondary | ICD-10-CM

## 2022-05-21 DIAGNOSIS — F17210 Nicotine dependence, cigarettes, uncomplicated: Secondary | ICD-10-CM

## 2022-06-02 ENCOUNTER — Encounter: Payer: Self-pay | Admitting: Primary Care

## 2022-06-02 ENCOUNTER — Ambulatory Visit (INDEPENDENT_AMBULATORY_CARE_PROVIDER_SITE_OTHER): Payer: Medicare HMO | Admitting: Primary Care

## 2022-06-02 VITALS — BP 110/58 | HR 67 | Temp 98.2°F | Ht 64.0 in | Wt 131.0 lb

## 2022-06-02 DIAGNOSIS — F17210 Nicotine dependence, cigarettes, uncomplicated: Secondary | ICD-10-CM

## 2022-06-02 DIAGNOSIS — F17200 Nicotine dependence, unspecified, uncomplicated: Secondary | ICD-10-CM

## 2022-06-02 DIAGNOSIS — E1159 Type 2 diabetes mellitus with other circulatory complications: Secondary | ICD-10-CM

## 2022-06-02 LAB — MICROALBUMIN / CREATININE URINE RATIO
Creatinine,U: 34.8 mg/dL
Microalb Creat Ratio: 2 mg/g (ref 0.0–30.0)
Microalb, Ur: <0.7 mg/dL (ref 0.0–1.9)

## 2022-06-02 LAB — POCT GLYCOSYLATED HEMOGLOBIN (HGB A1C): Hemoglobin A1C: 5.7 % — AB (ref 4.0–5.6)

## 2022-06-02 NOTE — Progress Notes (Signed)
Subjective:    Patient ID: Roy Richards, male    DOB: 1961/03/07, 61 y.o.   MRN: [id number]  HPI  Roy Richards is a very pleasant 61 y.o. male with a history of aortic atherosclerosis, hypertension, type 2 diabetes, COPD, hyperlipidemia who presents today for follow up of diabetes and to discuss CT lung cancer screening results.  1) Type 2 Diabetes:  Current medications include: metformin 500 mg BID. He denies GI upset, diarrhea.   He is checking his blood glucose 0 times daily.  Last A1C: 6.5 in March 2023, 5.7 today Last Eye Exam: UTD Last Foot Exam: Due Pneumonia Vaccination: 2022 Urine Microalbumin: lisinopril  Statin: atorvastatin   Dietary changes since last visit: He stopped consumption of sodas, he's cut back on portion sizes.    Exercise: Active at home, no regular exercise.   2) Tobacco Abuse/Lung Cancer Screening: Following with lung cancer screening program, underwent low-dose CT chest on 05/20/2022.  Findings revealed "benign appearance or behavior, lung RADS-2; cholelithiasis, aortic atherosclerosis, emphysema, coronary artery atherosclerosis.  Currently managed on atorvastatin 40 mg daily, aspirin 81 mg daily.  His last lipid panel was drawn in March 2023 which revealed LDL of 57, HDL 51.  His low-dose CT chest from 05/19/2021 reveals the same results including cholelithiasis.  He denies exertional chest pain, abdominal pain, nausea. He is still smoking 1/2 PPD which is reduced from 1 PPD.   BP Readings from Last 3 Encounters:  06/02/22 (!) 110/58  03/25/22 127/65  11/25/21 (!) 120/58     Review of Systems  Respiratory:  Negative for shortness of breath.   Cardiovascular:  Negative for chest pain.  Gastrointestinal:  Negative for abdominal pain and nausea.  Neurological:  Negative for numbness.         Past Medical History:  Diagnosis Date   Acute colitis 06/26/2019   Diabetes mellitus without complication ([place])    Hyperlipidemia    Hypertension      Social History   Socioeconomic History   Marital status: Single    Spouse name: Not on file   Number of children: Not on file   Years of education: Not on file   Highest education level: Not on file  Occupational History   Not on file  Tobacco Use   Smoking status: Some Days    Packs/day: 0.50    Years: 25.00    Total pack years: 12.50    Types: Cigarettes   Smokeless tobacco: Never  Vaping Use   Vaping Use: Never used  Substance and Sexual Activity   Alcohol use: Yes    Comment: occasionally   Drug use: Never   Sexual activity: Yes  Other Topics Concern   Not on file  Social History Narrative   Not on file   Social Determinants of Health   Financial Resource Strain: Not on file  Food Insecurity: Not on file  Transportation Needs: Not on file  Physical Activity: Not on file  Stress: Not on file  Social Connections: Not on file  Intimate Partner Violence: Not on file    Past Surgical History:  Procedure Laterality Date   HERNIA REPAIR      Family History  Problem Relation Age of Onset   COPD Mother    Heart disease Father    Hyperlipidemia Father    Hypertension Father     No Known Allergies  Current Outpatient Medications on File Prior to Visit  Medication Sig Dispense Refill   albuterol (  VENTOLIN HFA) 108 (90 Base) MCG/ACT inhaler Inhale 1-2 puffs into the lungs every 6 (six) hours as needed for wheezing or shortness of breath. 18 g 0   amLODipine (NORVASC) 10 MG tablet TAKE ONE TABLET BY MOUTH DAILY FOR BLOOD PRESSURE 90 tablet 3   aspirin 81 MG chewable tablet 1 tablet     atorvastatin (LIPITOR) 40 MG tablet Take 1 tablet (40 mg total) by mouth daily. for cholesterol. 90 tablet 2   Cholecalciferol (VITAMIN D) 50 MCG (2000 UT) tablet 1 tablet     citalopram (CELEXA) 40 MG tablet TAKE ONE TABLET BY MOUTH DAILY FOR ANXIETY 90 tablet 2   fluticasone-salmeterol (ADVAIR DISKUS) 250-50 MCG/ACT AEPB Inhale 1 puff into the lungs in the morning and at  bedtime. 3 each 3   lisinopril (ZESTRIL) 20 MG tablet TAKE ONE TABLET BY MOUTH DAILY 90 tablet 2   metFORMIN (GLUCOPHAGE) 500 MG tablet Take 1 tablet (500 mg total) by mouth 2 (two) times daily with a meal. for diabetes. 180 tablet 0   Multiple Vitamins-Minerals (MULTIVITAMIN ADULT PO) Take by mouth.     sildenafil (VIAGRA) 100 MG tablet 1/2 tablet     No current facility-administered medications on file prior to visit.    BP (!) 110/58   Pulse 67   Temp 98.2 F (36.8 C) (Temporal)   Ht 5\' 4"  (1.626 m)   Wt 131 lb (59.4 kg)   SpO2 96%   BMI 22.49 kg/m  Objective:   Physical Exam Cardiovascular:     Rate and Rhythm: Normal rate and regular rhythm.  Pulmonary:     Effort: Pulmonary effort is normal.     Breath sounds: Normal breath sounds. No wheezing or rales.  Musculoskeletal:     Cervical back: Neck supple.  Skin:    General: Skin is warm and dry.  Neurological:     Mental Status: He is alert and oriented to person, place, and time.           Assessment & Plan:   Problem List Items Addressed This Visit       Endocrine   Diabetes ([place]) - Primary    Improved and at goal with A1C today of 5.7!  Continue metformin 500 mg BID. Continue to work on diet. Encouraged regular exercise.  Foot exam today. Urine microalbumin due and pending. Pneumonia vaccine UTD. Managed on statin.  Follow up in 6 months      Relevant Orders   POCT glycosylated hemoglobin (Hb A1C) (Completed)   Microalbumin/Creatinine Ratio, Urine     Other   Tobacco dependence with current use    Reviewed CT lung cancer screening results from 2022 and 2023. No questions from patient.  Encouraged smoking cessation. Continue annual CT scan.          Pleas Koch, NP

## 2022-06-02 NOTE — Assessment & Plan Note (Signed)
Improved and at goal with A1C today of 5.7!  Continue metformin 500 mg BID. Continue to work on diet. Encouraged regular exercise.  Foot exam today. Urine microalbumin due and pending. Pneumonia vaccine UTD. Managed on statin.  Follow up in 6 months

## 2022-06-02 NOTE — Patient Instructions (Signed)
Stop by the lab prior to leaving today. I will notify you of your results once received.   Please schedule a physical to meet with me in 6 months.   It was a pleasure to see you today!   

## 2022-06-02 NOTE — Assessment & Plan Note (Signed)
Reviewed CT lung cancer screening results from 2022 and 2023. No questions from patient.  Encouraged smoking cessation. Continue annual CT scan.

## 2022-07-12 ENCOUNTER — Encounter (INDEPENDENT_AMBULATORY_CARE_PROVIDER_SITE_OTHER): Payer: Self-pay

## 2022-07-13 DIAGNOSIS — N529 Male erectile dysfunction, unspecified: Secondary | ICD-10-CM

## 2022-07-13 MED ORDER — SILDENAFIL CITRATE 100 MG PO TABS: ORAL_TABLET | ORAL | 0 refills | Status: DC

## 2022-07-27 ENCOUNTER — Ambulatory Visit
Admission: EM | Admit: 2022-07-27 | Discharge: 2022-07-27 | Disposition: A | Discharge status: Home / Self Care | Payer: Medicare HMO | Attending: Emergency Medicine | Admitting: Emergency Medicine

## 2022-07-27 DIAGNOSIS — R058 Other specified cough: Secondary | ICD-10-CM

## 2022-07-27 DIAGNOSIS — J441 Chronic obstructive pulmonary disease with (acute) exacerbation: Secondary | ICD-10-CM | POA: Diagnosis not present

## 2022-07-27 MED ORDER — PREDNISONE 10 MG PO TABS: 40.0000 mg | ORAL_TABLET | Freq: Every day | ORAL | 0 refills | Status: AC

## 2022-07-27 MED ORDER — AMOXICILLIN-POT CLAVULANATE 875-125 MG PO TABS: 1.0000 | ORAL_TABLET | Freq: Two times a day (BID) | ORAL | 0 refills | Status: DC

## 2022-07-27 NOTE — ED Triage Notes (Signed)
Patient to Urgent Care with complaints of chest congestion and cough x1 week. Reports initially feeling like he had a head cold and it has now moved down into his chest. Reports cough is productive with yellow sputum.   Denies any known fevers- reports he might have had one on Wednesday/ Thursday during the night.   Has been taking sudafed.

## 2022-07-27 NOTE — Discharge Instructions (Addendum)
Take the prednisone and Augmentin as directed.  Continue to use your inhalers.  Follow up with your primary care provider.

## 2022-07-27 NOTE — ED Provider Notes (Signed)
Roy Richards    CSN: [id number] Arrival date & time: 07/27/22  0857      History   Chief Complaint Chief Complaint  Patient presents with   Cough    HPI [name] is a 61 y.o. male.  Patient presents with cough productive of yellow sputum x1 week.  He denies fever, sore throat, chest pain, unusual shortness of breath, vomiting, diarrhea, or other symptoms.  Treatment at home with Sudafed.  His medical history includes COPD, hypertension, diabetes.  Last A1c was 5.7 on 06/02/2022.  The history is provided by the patient and medical records.    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 Authorizing Provider  amoxicillin-clavulanate (AUGMENTIN) 875-125 MG tablet Take 1 tablet by mouth every 12 (twelve) hours. 07/27/22  Yes Mickie Bail, NP  predniSONE (DELTASONE) 10 MG tablet Take 4 tablets (40 mg total) by mouth daily for 5 days. 07/27/22 08/01/22 Yes Mickie Bail, NP  albuterol (VENTOLIN HFA) 108 (90 Base) MCG/ACT inhaler Inhale 1-2 puffs into the lungs every 6 (six)  hours as needed for wheezing or shortness of breath. 04/01/22   [name], NP  amLODipine (NORVASC) 10 MG tablet TAKE ONE TABLET BY MOUTH DAILY FOR BLOOD PRESSURE 01/13/22   [name], NP  aspirin 81 MG chewable tablet 1 tablet    [provider]  atorvastatin (LIPITOR) 40 MG tablet Take 1 tablet (40 mg total) by mouth daily. for cholesterol. 02/20/22   [name], NP  Cholecalciferol (VITAMIN D) 50 MCG (2000 UT) tablet 1 tablet    [provider]  citalopram (CELEXA) 40 MG tablet TAKE ONE TABLET BY MOUTH DAILY FOR ANXIETY 02/20/22   [name], NP  fluticasone-salmeterol (ADVAIR DISKUS) 250-50 MCG/ACT AEPB Inhale 1 puff into the lungs in the morning and at bedtime. 11/18/21   [name], NP  lisinopril (ZESTRIL) 20 MG tablet TAKE ONE TABLET BY MOUTH DAILY 02/20/22   [name], NP  metFORMIN (GLUCOPHAGE) 500 MG tablet Take 1 tablet (500 mg total) by mouth 2 (two) times daily with a meal. for diabetes. 05/17/22   [name], NP  Multiple Vitamins-Minerals (MULTIVITAMIN ADULT PO) Take by mouth.    [provider]  sildenafil (VIAGRA) 100 MG tablet Take 1/2 to 1 tablet by mouth 30 min prior to sexual activity 07/13/22   [name], NP    Family History Family History  Problem Relation Age of Onset   COPD Mother    Heart disease Father    Hyperlipidemia  Father    Hypertension Father     Social History Social History   Tobacco Use   Smoking status: Some Days    Packs/day: 0.50    Years: 25.00    Total pack years: 12.50    Types: Cigarettes   Smokeless tobacco: Never  Vaping Use   Vaping Use: Never used  Substance Use Topics   Alcohol use: Yes    Comment: occasionally   Drug use: Never     Allergies   Patient has no known allergies.   Review of Systems Review of Systems  Constitutional:  Negative for chills and fever.  HENT:  Positive for congestion. Negative for ear pain and sore throat.    Respiratory:  Positive for cough. Negative for shortness of breath.   Cardiovascular:  Negative for chest pain and palpitations.  Gastrointestinal:  Negative for diarrhea and vomiting.  All other systems reviewed and are negative.    Physical Exam Triage Vital Signs ED Triage Vitals  Enc Vitals Group     BP      Pulse      Resp      Temp      Temp src      SpO2      Weight      Height      Head Circumference      Peak Flow      Pain Score      Pain Loc      Pain Edu?      Excl. in [place]?    No data found.  Updated Vital Signs BP 126/70   Pulse 69   Temp 98.1 F (36.7 C)   Resp 18   Ht 5\' 4"  (1.626 m)   Wt 125 lb (56.7 kg)   SpO2 92%   BMI 21.46 kg/m   Visual Acuity Right Eye Distance:   Left Eye Distance:   Bilateral Distance:    Right Eye Near:   Left Eye Near:    Bilateral Near:     Physical Exam Vitals and nursing note reviewed.  Constitutional:      General: He is not in acute distress.    Appearance: He is well-developed. He is not ill-appearing.  HENT:     Right Ear: Tympanic membrane normal.     Left Ear: Tympanic membrane normal.     Nose: Nose normal.     Mouth/Throat:     Mouth: Mucous membranes are moist.     Pharynx: Oropharynx is clear.  Cardiovascular:     Rate and Rhythm: Normal rate and regular rhythm.     Heart sounds: Normal heart sounds.  Pulmonary:     Effort: Pulmonary effort is normal. No respiratory distress.     Breath sounds: Rhonchi present.     Comments: Scattered rhonchi throughout.  Musculoskeletal:     Cervical back: Neck supple.  Skin:    General: Skin is warm and dry.  Neurological:     Mental Status: He is alert.  Psychiatric:        Mood and Affect: Mood normal.        Behavior: Behavior normal.      UC Treatments / Results  Labs (all labs ordered are listed, but only abnormal results are displayed) Labs Reviewed - No data to display  EKG   Radiology No results found.  Procedures Procedures  (including critical care time)  Medications Ordered in UC Medications - No data to display  Initial Impression /  Assessment and Plan / UC Course  I have reviewed the triage vital signs and the nursing notes.  Pertinent labs & imaging results that were available during my care of the patient were reviewed by me and considered in my medical decision making (see chart for details).   COPD exacerbation, Productive cough.  Treating with Augmentin and prednisone.  Instructed patient to continue using his inhalers as directed.  Education provided on COPD exacerbation.  Instructed patient to follow-up with his PCP.  ED precautions given.  Patient agrees to plan of care.   Final Clinical Impressions(s) / UC Diagnoses   Final diagnoses:  COPD exacerbation (HCC)  Productive cough     Discharge Instructions      Take the prednisone and Augmentin as directed.  Continue to use your inhalers.  Follow up with your primary care provider.        ED Prescriptions     Medication Sig Dispense Auth. Provider   predniSONE (DELTASONE) 10 MG tablet Take 4 tablets (40 mg total) by mouth daily for 5 days. 20 tablet Mickie Bail, NP   amoxicillin-clavulanate (AUGMENTIN) 875-125 MG tablet Take 1 tablet by mouth every 12 (twelve) hours. 14 tablet Mickie Bail, NP      PDMP not reviewed this encounter.   Mickie Bail, NP 07/27/22 1009

## 2022-08-13 ENCOUNTER — Other Ambulatory Visit: Payer: Self-pay | Admitting: Primary Care

## 2022-08-13 DIAGNOSIS — E1159 Type 2 diabetes mellitus with other circulatory complications: Secondary | ICD-10-CM

## 2022-08-30 ENCOUNTER — Other Ambulatory Visit: Payer: Self-pay | Admitting: *Deleted

## 2022-08-30 DIAGNOSIS — J449 Chronic obstructive pulmonary disease, unspecified: Secondary | ICD-10-CM

## 2022-08-31 MED ORDER — ALBUTEROL SULFATE HFA 108 (90 BASE) MCG/ACT IN AERS: 1.0000 | INHALATION_SPRAY | Freq: Four times a day (QID) | RESPIRATORY_TRACT | 0 refills | Status: DC | PRN

## 2022-10-14 ENCOUNTER — Emergency Department: Payer: Medicare HMO

## 2022-10-14 ENCOUNTER — Inpatient Hospital Stay
Admission: EM | Admit: 2022-10-14 | Discharge: 2022-10-15 | DRG: 190 | Disposition: A | Discharge status: Home / Self Care | Payer: Medicare HMO | Attending: Internal Medicine | Admitting: Internal Medicine

## 2022-10-14 ENCOUNTER — Other Ambulatory Visit: Payer: Self-pay

## 2022-10-14 DIAGNOSIS — J9601 Acute respiratory failure with hypoxia: Secondary | ICD-10-CM | POA: Diagnosis not present

## 2022-10-14 DIAGNOSIS — Z8249 Family history of ischemic heart disease and other diseases of the circulatory system: Secondary | ICD-10-CM

## 2022-10-14 DIAGNOSIS — I700 Atherosclerosis of aorta: Secondary | ICD-10-CM | POA: Diagnosis not present

## 2022-10-14 DIAGNOSIS — R197 Diarrhea, unspecified: Secondary | ICD-10-CM | POA: Diagnosis not present

## 2022-10-14 DIAGNOSIS — Z7984 Long term (current) use of oral hypoglycemic drugs: Secondary | ICD-10-CM

## 2022-10-14 DIAGNOSIS — E785 Hyperlipidemia, unspecified: Secondary | ICD-10-CM | POA: Diagnosis not present

## 2022-10-14 DIAGNOSIS — J441 Chronic obstructive pulmonary disease with (acute) exacerbation: Principal | ICD-10-CM | POA: Diagnosis present

## 2022-10-14 DIAGNOSIS — F17210 Nicotine dependence, cigarettes, uncomplicated: Secondary | ICD-10-CM | POA: Diagnosis present

## 2022-10-14 DIAGNOSIS — Z825 Family history of asthma and other chronic lower respiratory diseases: Secondary | ICD-10-CM

## 2022-10-14 DIAGNOSIS — A419 Sepsis, unspecified organism: Secondary | ICD-10-CM

## 2022-10-14 DIAGNOSIS — R0602 Shortness of breath: Secondary | ICD-10-CM | POA: Diagnosis not present

## 2022-10-14 DIAGNOSIS — Z83438 Family history of other disorder of lipoprotein metabolism and other lipidemia: Secondary | ICD-10-CM

## 2022-10-14 DIAGNOSIS — Z1152 Encounter for screening for COVID-19: Secondary | ICD-10-CM

## 2022-10-14 DIAGNOSIS — J439 Emphysema, unspecified: Secondary | ICD-10-CM | POA: Diagnosis not present

## 2022-10-14 DIAGNOSIS — R6520 Severe sepsis without septic shock: Secondary | ICD-10-CM | POA: Diagnosis not present

## 2022-10-14 DIAGNOSIS — J449 Chronic obstructive pulmonary disease, unspecified: Secondary | ICD-10-CM | POA: Diagnosis not present

## 2022-10-14 DIAGNOSIS — R079 Chest pain, unspecified: Secondary | ICD-10-CM | POA: Diagnosis not present

## 2022-10-14 DIAGNOSIS — Z79899 Other long term (current) drug therapy: Secondary | ICD-10-CM

## 2022-10-14 DIAGNOSIS — Z7982 Long term (current) use of aspirin: Secondary | ICD-10-CM

## 2022-10-14 DIAGNOSIS — F17200 Nicotine dependence, unspecified, uncomplicated: Secondary | ICD-10-CM | POA: Diagnosis not present

## 2022-10-14 DIAGNOSIS — I10 Essential (primary) hypertension: Secondary | ICD-10-CM | POA: Diagnosis not present

## 2022-10-14 DIAGNOSIS — Z7951 Long term (current) use of inhaled steroids: Secondary | ICD-10-CM

## 2022-10-14 DIAGNOSIS — E119 Type 2 diabetes mellitus without complications: Secondary | ICD-10-CM | POA: Diagnosis not present

## 2022-10-14 LAB — TROPONIN I (HIGH SENSITIVITY)
Troponin I (High Sensitivity): 10 ng/L (ref <18)
Troponin I (High Sensitivity): 11 ng/L (ref <18)

## 2022-10-14 LAB — RESP PANEL BY RT-PCR (RSV, FLU A&B, COVID)  RVPGX2
Influenza A by PCR: NEGATIVE
Influenza B by PCR: NEGATIVE
Resp Syncytial Virus by PCR: NEGATIVE
SARS Coronavirus 2 by RT PCR: NEGATIVE

## 2022-10-14 LAB — CBC
HCT: 43.3 % (ref 39.0–52.0)
Hemoglobin: 14.3 g/dL (ref 13.0–17.0)
MCH: 30.2 pg (ref 26.0–34.0)
MCHC: 33 g/dL (ref 30.0–36.0)
MCV: 91.4 fL (ref 80.0–100.0)
Platelets: 249 10*3/uL (ref 150–400)
RBC: 4.74 MIL/uL (ref 4.22–5.81)
RDW: 13.1 % (ref 11.5–15.5)
WBC: 20.1 10*3/uL — ABNORMAL HIGH (ref 4.0–10.5)
nRBC: 0 % (ref 0.0–0.2)

## 2022-10-14 LAB — URINALYSIS, ROUTINE W REFLEX MICROSCOPIC
Glucose, UA: NEGATIVE mg/dL
Hgb urine dipstick: NEGATIVE
Ketones, ur: NEGATIVE mg/dL
Leukocytes,Ua: NEGATIVE
Nitrite: NEGATIVE
Protein, ur: 30 mg/dL — AB
Specific Gravity, Urine: 1.032 — ABNORMAL HIGH (ref 1.005–1.030)
Squamous Epithelial / HPF: NONE SEEN /HPF (ref 0–5)
pH: 5 (ref 5.0–8.0)

## 2022-10-14 LAB — BASIC METABOLIC PANEL
Anion gap: 12 (ref 5–15)
BUN: 14 mg/dL (ref 8–23)
CO2: 26 mmol/L (ref 22–32)
Calcium: 9.1 mg/dL (ref 8.9–10.3)
Chloride: 100 mmol/L (ref 98–111)
Creatinine, Ser: 0.8 mg/dL (ref 0.61–1.24)
GFR, Estimated: >60 mL/min (ref >60)
Glucose, Bld: 122 mg/dL — ABNORMAL HIGH (ref 70–99)
Potassium: 3.7 mmol/L (ref 3.5–5.1)
Sodium: 138 mmol/L (ref 135–145)

## 2022-10-14 LAB — PROCALCITONIN: Procalcitonin: <0.1 ng/mL

## 2022-10-14 LAB — LACTIC ACID, PLASMA: Lactic Acid, Venous: 1 mmol/L (ref 0.5–1.9)

## 2022-10-14 LAB — BRAIN NATRIURETIC PEPTIDE: B Natriuretic Peptide: 110.6 pg/mL — ABNORMAL HIGH (ref 0.0–100.0)

## 2022-10-14 MED ORDER — ASPIRIN 81 MG PO CHEW
81.0000 mg | CHEWABLE_TABLET | Freq: Every day | ORAL | Status: DC
  Administered 2022-10-15: 81 mg via ORAL
  Filled 2022-10-14 (×2): qty 1

## 2022-10-14 MED ORDER — INSULIN ASPART 100 UNIT/ML IJ SOLN
0.0000 [IU] | Freq: Three times a day (TID) | INTRAMUSCULAR | Status: DC
  Administered 2022-10-15: 1 [IU] via SUBCUTANEOUS
  Filled 2022-10-14: qty 1

## 2022-10-14 MED ORDER — INSULIN ASPART 100 UNIT/ML IJ SOLN: 0.0000 [IU] | Freq: Every day | INTRAMUSCULAR | Status: DC

## 2022-10-14 MED ORDER — SODIUM CHLORIDE 0.9 % IV SOLN
500.0000 mg | INTRAVENOUS | Status: DC
  Administered 2022-10-14: 500 mg via INTRAVENOUS
  Filled 2022-10-14 (×2): qty 5

## 2022-10-14 MED ORDER — SODIUM CHLORIDE 0.9 % IV BOLUS
1000.0000 mL | Freq: Once | INTRAVENOUS | Status: AC
  Administered 2022-10-14: 1000 mL via INTRAVENOUS

## 2022-10-14 MED ORDER — SODIUM CHLORIDE 0.9 % IV SOLN
2.0000 g | INTRAVENOUS | Status: DC
  Administered 2022-10-14: 2 g via INTRAVENOUS
  Filled 2022-10-14 (×2): qty 20

## 2022-10-14 MED ORDER — ACETAMINOPHEN 650 MG RE SUPP: 650.0000 mg | Freq: Four times a day (QID) | RECTAL | Status: DC | PRN

## 2022-10-14 MED ORDER — ATORVASTATIN CALCIUM 20 MG PO TABS
40.0000 mg | ORAL_TABLET | Freq: Every day | ORAL | Status: DC
  Administered 2022-10-15: 40 mg via ORAL
  Filled 2022-10-14: qty 2

## 2022-10-14 MED ORDER — ACETAMINOPHEN 325 MG PO TABS
650.0000 mg | ORAL_TABLET | Freq: Four times a day (QID) | ORAL | Status: DC | PRN
  Administered 2022-10-15: 650 mg via ORAL
  Filled 2022-10-14: qty 2

## 2022-10-14 MED ORDER — ENOXAPARIN SODIUM 40 MG/0.4ML IJ SOSY
40.0000 mg | PREFILLED_SYRINGE | INTRAMUSCULAR | Status: DC
  Administered 2022-10-14: 40 mg via SUBCUTANEOUS
  Filled 2022-10-14: qty 0.4

## 2022-10-14 MED ORDER — CITALOPRAM HYDROBROMIDE 20 MG PO TABS
40.0000 mg | ORAL_TABLET | Freq: Every day | ORAL | Status: DC
  Administered 2022-10-15: 40 mg via ORAL
  Filled 2022-10-14: qty 2

## 2022-10-14 MED ORDER — ONDANSETRON HCL 4 MG/2ML IJ SOLN: 4.0000 mg | Freq: Four times a day (QID) | INTRAMUSCULAR | Status: DC | PRN

## 2022-10-14 MED ORDER — IPRATROPIUM-ALBUTEROL 0.5-2.5 (3) MG/3ML IN SOLN
3.0000 mL | Freq: Four times a day (QID) | RESPIRATORY_TRACT | Status: DC
  Administered 2022-10-14 – 2022-10-15 (×3): 3 mL via RESPIRATORY_TRACT
  Filled 2022-10-14 (×3): qty 3

## 2022-10-14 MED ORDER — IPRATROPIUM-ALBUTEROL 0.5-2.5 (3) MG/3ML IN SOLN: 3.0000 mL | Freq: Four times a day (QID) | RESPIRATORY_TRACT | Status: DC | PRN

## 2022-10-14 MED ORDER — ONDANSETRON HCL 4 MG PO TABS: 4.0000 mg | ORAL_TABLET | Freq: Four times a day (QID) | ORAL | Status: DC | PRN

## 2022-10-14 MED ORDER — PREDNISONE 20 MG PO TABS
50.0000 mg | ORAL_TABLET | Freq: Every day | ORAL | Status: DC
  Administered 2022-10-14: 50 mg via ORAL
  Filled 2022-10-14: qty 1

## 2022-10-14 MED ORDER — POLYETHYLENE GLYCOL 3350 17 G PO PACK: 17.0000 g | PACK | Freq: Every day | ORAL | Status: DC | PRN

## 2022-10-14 NOTE — Assessment & Plan Note (Addendum)
Blood pressure slightly soft, resume on discharge

## 2022-10-14 NOTE — Consult Note (Signed)
CODE SEPSIS - PHARMACY COMMUNICATION  **Broad Spectrum Antibiotics should be administered within 1 hour of Sepsis diagnosis**  Time Code Sepsis Called/Page Received: 1533  Antibiotics Ordered: azithromycin & ceftriaxone  Time of 1st antibiotic administration: 1710   If necessary, Name of Provider/Nurse Contacted: n/a   Samil Mecham Rodriguez-Guzman PharmD, BCPS 10/14/2022 3:38 PM

## 2022-10-14 NOTE — ED Notes (Signed)
Pt in xray

## 2022-10-14 NOTE — H&P (Addendum)
History and Physical    Patient: Roy Richards CWC:[id number] DOB: 09/08/1961 DOA: 10/14/2022 DOS: the patient was seen and examined on 10/14/2022 PCP: Pleas Koch, NP  Patient coming from: Home  Chief Complaint:  Chief Complaint  Patient presents with   Shortness of Breath   HPI: Patient is a 62 year old male with past medical history of COPD, hypertension and diabetes mellitus who presented to the emergency room on 2/1 with complaints of shortness of breath that have been going on for 3 to 4 days.  Patient is also noted subjective fevers and productive cough with yellowish sputum.  He stated at home when he checked his oxygen level was 81% on room air.  Emergency room, patient noted to have an oxygenation of 87% and put on 2 L.  On admission, lab work noteworthy for elevated white blood cell count of 20.1, but with normal procalcitonin and negative flu, COVID and RSV titers.  Chest x-ray suggestive of airway thickening for acute bronchitis, but there is no sign of pneumonia.  Patient brought in for further evaluation and treatment.  Review of Systems: Patient states breathing is a bit better than when he first came in.  Still some cough and some wheezing.  Does not feel very feverish now.  Also reports some diarrhea that has been going on for several days.  Otherwise, review of systems is otherwise negative.  He does state that at baseline, with ambulation, he gets easily winded. Past Medical History:  Diagnosis Date   Acute colitis 06/26/2019   Diabetes mellitus without complication ([place])    Hyperlipidemia    Hypertension    Past Surgical History:  Procedure Laterality Date   HERNIA REPAIR     Social History:  reports that he has been smoking cigarettes. He has a 12.50 pack-year smoking history. He has never used smokeless tobacco. He reports current alcohol use. He reports that he does not use drugs.  No Known Allergies  Family History  Problem Relation Age of Onset   COPD  Mother    Heart disease Father    Hyperlipidemia Father    Hypertension Father     Prior to Admission medications   Medication Sig Start Date End Date Taking? Authorizing Provider  albuterol (VENTOLIN HFA) 108 (90 Base) MCG/ACT inhaler Inhale 1-2 puffs into the lungs every 6 (six) hours as needed for wheezing or shortness of breath. 08/31/22  Yes Pleas Koch, NP  amLODipine (NORVASC) 10 MG tablet TAKE ONE TABLET BY MOUTH DAILY FOR BLOOD PRESSURE 01/13/22  Yes Pleas Koch, NP  aspirin 81 MG chewable tablet Chew 81 mg by mouth daily.   Yes [provider]  atorvastatin (LIPITOR) 40 MG tablet Take 1 tablet (40 mg total) by mouth daily. for cholesterol. 02/20/22  Yes Pleas Koch, NP  Cholecalciferol (VITAMIN D) 50 MCG (2000 UT) tablet Take 2,000 Units by mouth daily.   Yes [provider]  citalopram (CELEXA) 40 MG tablet TAKE ONE TABLET BY MOUTH DAILY FOR ANXIETY 02/20/22  Yes Pleas Koch, NP  fluticasone-salmeterol (ADVAIR DISKUS) 250-50 MCG/ACT AEPB Inhale 1 puff into the lungs in the morning and at bedtime. 11/18/21  Yes Pleas Koch, NP  lisinopril (ZESTRIL) 20 MG tablet TAKE ONE TABLET BY MOUTH DAILY 02/20/22  Yes Pleas Koch, NP  metFORMIN (GLUCOPHAGE) 500 MG tablet TAKE 1 TABLET BY MOUTH TWICE A DAY WITH A MEAL FOR DIABETES 08/13/22  Yes Pleas Koch, NP  Multiple Vitamins-Minerals (MULTIVITAMIN  ADULT PO) Take by mouth.   Yes [provider]  sildenafil (VIAGRA) 100 MG tablet Take 1/2 to 1 tablet by mouth 30 min prior to sexual activity 07/13/22  Yes Pleas Koch, NP  amoxicillin-clavulanate (AUGMENTIN) 875-125 MG tablet Take 1 tablet by mouth every 12 (twelve) hours. Patient not taking: Reported on 10/14/2022 07/27/22   Sharion Balloon, NP    Physical Exam: Vitals:   10/14/22 1429 10/14/22 1430  BP: (!) 120/47   Pulse: 76   Resp: (!) 22   Temp: 98.4 F (36.9 C)   TempSrc: Oral   SpO2: 92%   Weight:  54.9 kg   Height:  5\' 5"  (1.651 m)   General: Alert and oriented x 3, no acute distress HEENT: Normocephalic, atraumatic, mucous membranes are moist Neck: Supple, no JVD Cardiovascular: Regular rate and rhythm, S1-S2 Lungs: Decreased breath sounds throughout, prolonged expiratory phase although no wheezing Abdomen: Soft, nontender, nondistended, positive bowel sounds Extremities: No clubbing or cyanosis or edema Neuro: No focal deficits Psychiatry: Patient is appropriate, no evidence of psychoses Skin: No skin breaks, tears or lesions  Data Reviewed:  Normal procalcitonin.  BNP pending.  Negative flu, COVID and RSV titers.  White blood cell count of 20.7.  Assessment and Plan: * Acute respiratory failure with hypoxia (HCC) Looks to be secondary to COPD exacerbation from bronchitis.  BNP only at 110.  Noted normal procalcitonin and negative for flu, COVID and RSV.  COPD (chronic obstructive pulmonary disease) ([place]) Will treat with steroids and nebulizers.  Repeat procalcitonin in the morning before stopping antibiotics given presentation.  Sepsis ruled out.  Patient may have mild bronchitis, but SIRS criteria more attributable to respiratory issues rather than sepsis.  Essential hypertension Blood pressure slightly soft, will hold off on home medications until morning  Diabetes ([place]) Patient just on metformin.  A1c done in September notes good control at 5.7.  Sliding scale only.  Expect some elevation given steroids  Hyperlipidemia Continue statin  Tobacco dependence with current use Continues to smoke although not as much.  Counseled.  Diarrhea No episodes since being in the ER.  Sounds possibly like he was swallowing his phlegm causing loose stools.  Nevertheless, will check a GI panel.      Advance Care Planning: Full code  Consults: None  Family Communication: Daughter at the bedside  Severity of Illness: The appropriate patient status for this patient is INPATIENT.  Inpatient status is judged to be reasonable and necessary in order to provide the required intensity of service to ensure the patient's safety. The patient's presenting symptoms, physical exam findings, and initial radiographic and laboratory data in the context of their chronic comorbidities is felt to place them at high risk for further clinical deterioration. Furthermore, it is not anticipated that the patient will be medically stable for discharge from the hospital within 2 midnights of admission.   * I certify that at the point of admission it is my clinical judgment that the patient will require inpatient hospital care spanning beyond 2 midnights from the point of admission due to high intensity of service, high risk for further deterioration and high frequency of surveillance required.*  Author: Annita Brod, MD 10/14/2022 8:13 PM  For on call review [email or web address].

## 2022-10-14 NOTE — Assessment & Plan Note (Addendum)
Will treat with steroids and nebulizers.  Repeat procalcitonin in the morning before stopping antibiotics given presentation.  Sepsis ruled out.  Patient may have mild bronchitis, but SIRS criteria more attributable to respiratory issues rather than sepsis.  Discharged home on p.o. antibiotics x 2 days for total of 3 days of therapy plus steroid taper.

## 2022-10-14 NOTE — ED Provider Notes (Signed)
[facility] Provider Note    Event Date/Time   First MD Initiated Contact with Patient 10/14/22 1450     (approximate)   History   Shortness of Breath   HPI  Roy Richards is a 62 y.o. male with COPD who comes in with shortness of breath.  Patient reports his oxygen at home was 81% he does not have oxygen at home.  But his baseline is 93%.  He reports that he has been feeling more short of breath.  Here in triage she was 87% and placed on 2 L.  Patient reports he has not been feeling well for the past 4 days.  He reports having fevers but taking Advil, coughing up mucus, shortness of breath.  He noted low oxygen levels and therefore came in.  He denies any abdominal pain.   Physical Exam   Triage Vital Signs: ED Triage Vitals  Enc Vitals Group     BP 10/14/22 1429 (!) 120/47     Pulse Rate 10/14/22 1429 76     Resp 10/14/22 1429 (!) 22     Temp 10/14/22 1429 98.4 F (36.9 C)     Temp Source 10/14/22 1429 Oral     SpO2 10/14/22 1429 92 %     Weight 10/14/22 1430 121 lb (54.9 kg)     Height 10/14/22 1430 5\' 5"  (1.651 m)     Head Circumference --      Peak Flow --      Pain Score 10/14/22 1429 1     Pain Loc --      Pain Edu? --      Excl. in [place]? --     Most recent vital signs: Vitals:   10/14/22 1429  BP: (!) 120/47  Pulse: 76  Resp: (!) 22  Temp: 98.4 F (36.9 C)  SpO2: 92%     General: Awake, no distress.  CV:  Good peripheral perfusion.  Resp:  Normal effort.  Abd:  No distention.  Other:  Soft nontender abdomen.  No wheezing noted.  Patient is on 2 L.   ED Results / Procedures / Treatments   Labs (all labs ordered are listed, but only abnormal results are displayed) Labs Reviewed  BASIC METABOLIC PANEL - Abnormal; Notable for the following components:      Result Value   Glucose, Bld 122 (*)    All other components within normal limits  CBC - Abnormal; Notable for the following components:   WBC 20.1 (*)    All other  components within normal limits  TROPONIN I (HIGH SENSITIVITY)     EKG  My interpretation of EKG:  Normal sinus rhythm 79 without any ST elevation, T wave inversion in lead III, normal intervals  RADIOLOGY I have reviewed the xray personally and interpreted and some evidence of possible viral illness no focal PNA    PROCEDURES:  Critical Care performed: Yes, see critical care procedure note(s)  .Critical Care  Performed by: Vanessa Magnolia, MD Authorized by: Vanessa Harvard, MD   Critical care provider statement:    Critical care time (minutes):  30   Critical care was necessary to treat or prevent imminent or life-threatening deterioration of the following conditions:  Respiratory failure and sepsis   Critical care was time spent personally by me on the following activities:  Development of treatment plan with patient or surrogate, discussions with consultants, evaluation of patient's response to treatment, examination of patient, ordering and  review of laboratory studies, ordering and review of radiographic studies, ordering and performing treatments and interventions, pulse oximetry, re-evaluation of patient's condition and review of old [place] ED: Medications  cefTRIAXone (ROCEPHIN) 2 g in sodium chloride 0.9 % 100 mL IVPB (has no administration in time range)  azithromycin (ZITHROMAX) 500 mg in sodium chloride 0.9 % 250 mL IVPB (has no administration in time range)     IMPRESSION / MDM / [place] / ED COURSE  I reviewed the triage vital signs and the nursing notes.   Patient's presentation is most consistent with acute presentation with potential threat to life or bodily function.   Patient comes in for cough fever shortness of breath hypoxic in triage.  No leg swelling or history of blood clots to suggest DVT.  Symptoms most consistent with infectious in nature.  Sepsis alert called due to elevated white count and respiratory rate  blood cultures, lactate ordered.  Fluid was ordered.  Will also add on COVID, flu, RSV.  Given patient's hypoxic patient was placed on 2 L.  Patient will require hospitalization  Troponin was negative BNP reassuring.  CBC shows elevated white count of 20   FINAL CLINICAL IMPRESSION(S) / ED DIAGNOSES   Final diagnoses:  Acute respiratory failure with hypoxia (HCC)  Sepsis, due to unspecified organism, unspecified whether acute organ dysfunction present [facility])     Rx / DC Orders   ED Discharge Orders     None        Note:  This document was prepared using Dragon voice recognition software and may include unintentional dictation errors.   Vanessa [place], MD 10/14/22 [phone]

## 2022-10-14 NOTE — Progress Notes (Signed)
Elink following code sepsis

## 2022-10-14 NOTE — Assessment & Plan Note (Signed)
Patient just on metformin.  A1c done in September notes good control at 5.7.  Sliding scale only.  Expect some elevation given steroids

## 2022-10-14 NOTE — Hospital Course (Signed)
Patient is a 62 year old male with past medical history of COPD, hypertension and diabetes mellitus who presented to the emergency room on 2/1 with complaints of shortness of breath that have been going on for 3 to 4 days.  Patient is also noted subjective fevers and productive cough with yellowish sputum.  He stated at home when he checked his oxygen level was 81% on room air.  Emergency room, patient noted to have an oxygenation of 87% and put on 2 L.  On admission, lab work noteworthy for elevated white blood cell count of 20.1, but with normal procalcitonin and negative flu, COVID and RSV titers.  Chest x-ray suggestive of airway thickening for acute bronchitis, but there is no sign of pneumonia.  Patient brought in for further evaluation and treatment.

## 2022-10-14 NOTE — Assessment & Plan Note (Addendum)
No episodes since being in the ER.  Sounds possibly like he was swallowing his phlegm causing loose stools.  GI panel unremarkable, resolved

## 2022-10-14 NOTE — Assessment & Plan Note (Addendum)
Looks to be secondary to COPD exacerbation from bronchitis.  BNP only at 110.  Noted normal procalcitonin and negative for flu, COVID and RSV.  By following day, oxygenation had improved and with ambulation, oxygen sats at 90% on room air

## 2022-10-14 NOTE — Telephone Encounter (Signed)
I spoke with pt and pt was being taken to [facility] ED now by daughter driving pt in car. Pt said he I presently having mid CP that is not radiating anywhere right now. Pt said most of this morning oxygen level was 85%; when pt came back from using restroom oxygen level was 81% and that is when pt decided to go to ED. Pt sounds SOB now while talking on phone. Sending note to Gentry Fitz NP and [name] pool.

## 2022-10-14 NOTE — ED Triage Notes (Signed)
Pt states coming in with shortness of breath. Pt states history of COPD, but his oxygen at home was as low as 81%. Pt states he does not use oxygen at home, but his baseline is 93% on room air. Pt was as low as 87% in triage, 2lpm placed and oxygen up to 91%

## 2022-10-14 NOTE — Assessment & Plan Note (Signed)
Continues to smoke although not as much.  Counseled.

## 2022-10-14 NOTE — Assessment & Plan Note (Signed)
Continue statin. 

## 2022-10-14 NOTE — Sepsis Progress Note (Signed)
Sent chat to RN re: lactic acid & BC draws.  At 1705 spoke to the RN who stated he had sent the blood work.

## 2022-10-15 ENCOUNTER — Encounter: Payer: Self-pay | Admitting: Internal Medicine

## 2022-10-15 DIAGNOSIS — F17200 Nicotine dependence, unspecified, uncomplicated: Secondary | ICD-10-CM | POA: Diagnosis not present

## 2022-10-15 DIAGNOSIS — I10 Essential (primary) hypertension: Secondary | ICD-10-CM | POA: Diagnosis not present

## 2022-10-15 DIAGNOSIS — J9601 Acute respiratory failure with hypoxia: Secondary | ICD-10-CM | POA: Diagnosis not present

## 2022-10-15 DIAGNOSIS — J449 Chronic obstructive pulmonary disease, unspecified: Secondary | ICD-10-CM | POA: Diagnosis not present

## 2022-10-15 LAB — BASIC METABOLIC PANEL
Anion gap: 11 (ref 5–15)
BUN: 12 mg/dL (ref 8–23)
CO2: 27 mmol/L (ref 22–32)
Calcium: 8.8 mg/dL — ABNORMAL LOW (ref 8.9–10.3)
Chloride: 102 mmol/L (ref 98–111)
Creatinine, Ser: 0.74 mg/dL (ref 0.61–1.24)
GFR, Estimated: >60 mL/min (ref >60)
Glucose, Bld: 141 mg/dL — ABNORMAL HIGH (ref 70–99)
Potassium: 4 mmol/L (ref 3.5–5.1)
Sodium: 140 mmol/L (ref 135–145)

## 2022-10-15 LAB — CBC
HCT: 40.6 % (ref 39.0–52.0)
Hemoglobin: 13.3 g/dL (ref 13.0–17.0)
MCH: 30 pg (ref 26.0–34.0)
MCHC: 32.8 g/dL (ref 30.0–36.0)
MCV: 91.6 fL (ref 80.0–100.0)
Platelets: 234 10*3/uL (ref 150–400)
RBC: 4.43 MIL/uL (ref 4.22–5.81)
RDW: 13.1 % (ref 11.5–15.5)
WBC: 15.8 10*3/uL — ABNORMAL HIGH (ref 4.0–10.5)
nRBC: 0 % (ref 0.0–0.2)

## 2022-10-15 LAB — GLUCOSE, CAPILLARY
Glucose-Capillary: 128 mg/dL — ABNORMAL HIGH (ref 70–99)
Glucose-Capillary: 227 mg/dL — ABNORMAL HIGH (ref 70–99)
Glucose-Capillary: 126 mg/dL — ABNORMAL HIGH (ref 70–99)

## 2022-10-15 LAB — HIV ANTIBODY (ROUTINE TESTING W REFLEX): HIV Screen 4th Generation wRfx: NONREACTIVE

## 2022-10-15 LAB — PROCALCITONIN: Procalcitonin: <0.1 ng/mL

## 2022-10-15 MED ORDER — PREDNISONE 10 MG PO TABS: ORAL_TABLET | ORAL | 0 refills | Status: AC

## 2022-10-15 MED ORDER — AMOXICILLIN-POT CLAVULANATE 875-125 MG PO TABS: 1.0000 | ORAL_TABLET | Freq: Two times a day (BID) | ORAL | 0 refills | Status: AC

## 2022-10-15 MED ORDER — IPRATROPIUM-ALBUTEROL 0.5-2.5 (3) MG/3ML IN SOLN: 3.0000 mL | Freq: Two times a day (BID) | RESPIRATORY_TRACT | Status: DC

## 2022-10-15 NOTE — Plan of Care (Signed)
Patient AOX4, VSS throughout shift.  Pt c/o pain relieved by PRN tylenol.  All meds given on time as ordered.  Diminished lungs, IS taught and encouraged.  Pt ambulated with steady gait to bathroom.  POC maintained, will continue to monitor.  Problem: Education: Goal: Ability to describe self-care measures that may prevent or decrease complications (Diabetes Survival Skills Education) will improve Outcome: Progressing Goal: Individualized Educational Video(s) Outcome: Progressing   Problem: Coping: Goal: Ability to adjust to condition or change in health will improve Outcome: Progressing   Problem: Fluid Volume: Goal: Ability to maintain a balanced intake and output will improve Outcome: Progressing   Problem: Health Behavior/Discharge Planning: Goal: Ability to identify and utilize available resources and services will improve Outcome: Progressing Goal: Ability to manage health-related needs will improve Outcome: Progressing   Problem: Metabolic: Goal: Ability to maintain appropriate glucose levels will improve Outcome: Progressing   Problem: Nutritional: Goal: Maintenance of adequate nutrition will improve Outcome: Progressing Goal: Progress toward achieving an optimal weight will improve Outcome: Progressing   Problem: Skin Integrity: Goal: Risk for impaired skin integrity will decrease Outcome: Progressing   Problem: Tissue Perfusion: Goal: Adequacy of tissue perfusion will improve Outcome: Progressing   Problem: Education: Goal: Knowledge of General Education information will improve Description: Including pain rating scale, medication(s)/side effects and non-pharmacologic comfort measures Outcome: Progressing   Problem: Health Behavior/Discharge Planning: Goal: Ability to manage health-related needs will improve Outcome: Progressing   Problem: Clinical Measurements: Goal: Ability to maintain clinical measurements within normal limits will improve Outcome:  Progressing Goal: Will remain free from infection Outcome: Progressing Goal: Diagnostic test results will improve Outcome: Progressing Goal: Respiratory complications will improve Outcome: Progressing Goal: Cardiovascular complication will be avoided Outcome: Progressing   Problem: Activity: Goal: Risk for activity intolerance will decrease Outcome: Progressing   Problem: Nutrition: Goal: Adequate nutrition will be maintained Outcome: Progressing   Problem: Coping: Goal: Level of anxiety will decrease Outcome: Progressing   Problem: Elimination: Goal: Will not experience complications related to bowel motility Outcome: Progressing Goal: Will not experience complications related to urinary retention Outcome: Progressing   Problem: Pain Managment: Goal: General experience of comfort will improve Outcome: Progressing   Problem: Safety: Goal: Ability to remain free from injury will improve Outcome: Progressing   Problem: Skin Integrity: Goal: Risk for impaired skin integrity will decrease Outcome: Progressing

## 2022-10-15 NOTE — Discharge Summary (Addendum)
Physician Discharge Summary   Patient: Roy Richards MRN: [id number] DOB: 05/14/1961  Admit date:     10/14/2022  Discharge date: 10/15/22  Discharge Physician: Hollice Espy   PCP: [name], NP   Recommendations at discharge:   New medication: Augmentin 875 p.o. twice daily x 2 days New medication: Prednisone taper Patient given referral to establish with Dr. Madelyn Brunner pulmonary.  Discharge Diagnoses: Active Problems:   COPD (chronic obstructive pulmonary disease) (HCC)   Essential hypertension   Diabetes (HCC)   Hyperlipidemia   Tobacco dependence with current use  Principal Problem (Resolved):   Acute respiratory failure with hypoxia [facility]) Resolved Problems:   Diarrhea  Hospital Course: Patient is a 62 year old male with past medical history of COPD, hypertension and diabetes mellitus who presented to the emergency room on 2/1 with complaints of shortness of breath that have been going on for 3 to 4 days.  Patient is also noted subjective fevers and productive cough with yellowish sputum.  He stated at home when he checked his oxygen level was 81% on room air.  Emergency room, patient noted to have an oxygenation of 87% and put on 2 L.  On admission, lab work noteworthy for elevated white blood cell count of 20.1, but with normal procalcitonin and negative flu, COVID and RSV titers.  Chest x-ray suggestive of airway thickening for acute bronchitis, but there is no sign of pneumonia.  Patient brought in for further evaluation and treatment.  Assessment and Plan: * Acute respiratory failure with hypoxia (HCC)-resolved as of 10/15/2022 Looks to be secondary to COPD exacerbation from bronchitis.  BNP only at 110.  Noted normal procalcitonin and negative for flu, COVID and RSV.  By following day, oxygenation had improved and with ambulation, oxygen sats at 90% on room air  COPD (chronic obstructive pulmonary disease) (HCC) Will treat with steroids and nebulizers.  Repeat  procalcitonin in the morning before stopping antibiotics given presentation.  Sepsis ruled out.  Patient may have mild bronchitis, but SIRS criteria more attributable to respiratory issues rather than sepsis.  Discharged home on p.o. antibiotics x 2 days for total of 3 days of therapy plus steroid taper.  Essential hypertension Blood pressure slightly soft, resume on discharge  Diabetes [facility]) Patient just on metformin.  A1c done in September notes good control at 5.7.  Sliding scale only.  Expect some elevation given steroids  Hyperlipidemia Continue statin  Tobacco dependence with current use Continues to smoke although not as much.  Counseled.  Diarrhea-resolved as of 10/15/2022 No episodes since being in the ER.  Sounds possibly like he was swallowing his phlegm causing loose stools.  GI panel unremarkable, resolved         Consultants: None Procedures performed: None Disposition: Home Diet recommendation:  Discharge Diet Orders (From admission, onward)     Start     Ordered   10/15/22 0000  Diet - low sodium heart healthy        10/15/22 1352           Cardiac diet DISCHARGE MEDICATION: Allergies as of 10/15/2022   No Known Allergies      Medication List     TAKE these medications    albuterol 108 (90 Base) MCG/ACT inhaler Commonly known as: VENTOLIN HFA Inhale 1-2 puffs into the lungs every 6 (six) hours as needed for wheezing or shortness of breath.   amLODipine 10 MG tablet Commonly known as: NORVASC TAKE ONE TABLET BY MOUTH DAILY  FOR BLOOD PRESSURE   amoxicillin-clavulanate 875-125 MG tablet Commonly known as: AUGMENTIN Take 1 tablet by mouth 2 (two) times daily for 2 days.   aspirin 81 MG chewable tablet Chew 81 mg by mouth daily.   atorvastatin 40 MG tablet Commonly known as: LIPITOR Take 1 tablet (40 mg total) by mouth daily. for cholesterol.   citalopram 40 MG tablet Commonly known as: CELEXA TAKE ONE TABLET BY MOUTH DAILY FOR ANXIETY    fluticasone-salmeterol 250-50 MCG/ACT Aepb Commonly known as: Advair Diskus Inhale 1 puff into the lungs in the morning and at bedtime.   lisinopril 20 MG tablet Commonly known as: ZESTRIL TAKE ONE TABLET BY MOUTH DAILY   metFORMIN 500 MG tablet Commonly known as: GLUCOPHAGE TAKE 1 TABLET BY MOUTH TWICE A DAY WITH A MEAL FOR DIABETES   MULTIVITAMIN ADULT PO Take by mouth.   predniSONE 10 MG tablet Commonly known as: DELTASONE Take 4 tablets (40 mg total) by mouth daily with breakfast for 1 day, THEN 3 tablets (30 mg total) daily with breakfast for 1 day, THEN 2 tablets (20 mg total) daily with breakfast for 1 day, THEN 1 tablet (10 mg total) daily with breakfast for 1 day. Start taking on: October 16, 2022   sildenafil 100 MG tablet Commonly known as: VIAGRA Take 1/2 to 1 tablet by mouth 30 min prior to sexual activity   Vitamin D 50 MCG (2000 UT) tablet Take 2,000 Units by mouth daily.        Follow-up Information     Flora Lipps, MD. Schedule an appointment as soon as possible for a visit.   Specialties: Pulmonary Disease, Cardiology Contact information: [place] [address] [phone]                Discharge Exam: Danley Danker Weights   10/14/22 1430 10/14/22 2300  Weight: 54.9 kg 57.8 kg   General: Alert and oriented x 3, no acute distress Lungs: Decreased breath sounds throughout  Condition at discharge: good  The results of significant diagnostics from this hospitalization (including imaging, microbiology, ancillary and laboratory) are listed below for reference.   Imaging Studies: DG Chest 2 View  Result Date: 10/14/2022 CLINICAL DATA:  Chest pain and shortness of breath over the last 2 days EXAM: CHEST - 2 VIEW COMPARISON:  Chest CT 05/19/2022 and chest radiograph 04/16/2021 FINDINGS: Atherosclerotic calcification of the aortic arch. Tapering of the peripheral pulmonary vasculature favors emphysema. Mild interstitial  accentuation, as can commonly be encountered in smokers. Airway thickening is present, suggesting bronchitis or reactive airways disease. Heart size within normal limits. IMPRESSION: 1. Airway thickening is present, suggesting bronchitis or reactive airways disease. 2. Mild interstitial accentuation, as can commonly be encountered in smokers. 3. Aortic Atherosclerosis (ICD10-I70.0) and Emphysema (ICD10-J43.9). Electronically Signed   By: Van Clines M.D.   On: 10/14/2022 15:12    Microbiology: Results for orders placed or performed during the hospital encounter of 10/14/22  Resp panel by RT-PCR (RSV, Flu A&B, Covid) Anterior Nasal Swab     Status: None   Collection Time: 10/14/22  4:28 PM   Specimen: Anterior Nasal Swab  Result Value Ref Range Status   SARS Coronavirus 2 by RT PCR NEGATIVE NEGATIVE Final    Comment: (NOTE) SARS-CoV-2 target nucleic acids are NOT DETECTED.  The SARS-CoV-2 RNA is generally detectable in upper respiratory specimens during the acute phase of infection. The lowest concentration of SARS-CoV-2 viral copies this assay can detect is 138  copies/mL. A negative result does not preclude SARS-Cov-2 infection and should not be used as the sole basis for treatment or other patient management decisions. A negative result may occur with  improper specimen collection/handling, submission of specimen other than nasopharyngeal swab, presence of viral mutation(s) within the areas targeted by this assay, and inadequate number of viral copies(<138 copies/mL). A negative result must be combined with clinical observations, patient history, and epidemiological information. The expected result is Negative.  Fact Sheet for Patients:  EntrepreneurPulse.com.au  Fact Sheet for Healthcare Providers:  IncredibleEmployment.be  This test is no t yet approved or cleared by the [place] FDA and  has been authorized for detection and/or  diagnosis of SARS-CoV-2 by FDA under an Emergency Use Authorization (EUA). This EUA will remain  in effect (meaning this test can be used) for the duration of the COVID-19 declaration under Section 564(b)(1) of the Act, 21 U.S.C.section 360bbb-3(b)(1), unless the authorization is terminated  or revoked sooner.       Influenza A by PCR NEGATIVE NEGATIVE Final   Influenza B by PCR NEGATIVE NEGATIVE Final    Comment: (NOTE) The Xpert Xpress SARS-CoV-2/FLU/RSV plus assay is intended as an aid in the diagnosis of influenza from Nasopharyngeal swab specimens and should not be used as a sole basis for treatment. Nasal washings and aspirates are unacceptable for Xpert Xpress SARS-CoV-2/FLU/RSV testing.  Fact Sheet for Patients: EntrepreneurPulse.com.au  Fact Sheet for Healthcare Providers: IncredibleEmployment.be  This test is not yet approved or cleared by the [place] FDA and has been authorized for detection and/or diagnosis of SARS-CoV-2 by FDA under an Emergency Use Authorization (EUA). This EUA will remain in effect (meaning this test can be used) for the duration of the COVID-19 declaration under Section 564(b)(1) of the Act, 21 U.S.C. section 360bbb-3(b)(1), unless the authorization is terminated or revoked.     Resp Syncytial Virus by PCR NEGATIVE NEGATIVE Final    Comment: (NOTE) Fact Sheet for Patients: EntrepreneurPulse.com.au  Fact Sheet for Healthcare Providers: IncredibleEmployment.be  This test is not yet approved or cleared by the [place] FDA and has been authorized for detection and/or diagnosis of SARS-CoV-2 by FDA under an Emergency Use Authorization (EUA). This EUA will remain in effect (meaning this test can be used) for the duration of the COVID-19 declaration under Section 564(b)(1) of the Act, 21 U.S.C. section 360bbb-3(b)(1), unless the authorization is terminated  or revoked.  Performed at [facility], [address] [place], [place] [address]   Blood culture (routine x 2)     Status: None (Preliminary result)   Collection Time: 10/14/22  4:28 PM   Specimen: BLOOD  Result Value Ref Range Status   Specimen Description BLOOD BLOOD LEFT FOREARM  Final   Special Requests   Final    BOTTLES DRAWN AEROBIC AND ANAEROBIC Blood Culture results may not be optimal due to an excessive volume of blood received in culture bottles   Culture   Final    NO GROWTH < 12 HOURS Performed at [facility], [address], [place], [place] [address]    Report Status PENDING  Incomplete  Blood culture (routine x 2)     Status: None (Preliminary result)   Collection Time: 10/14/22  4:29 PM   Specimen: BLOOD  Result Value Ref Range Status   Specimen Description BLOOD BLOOD RIGHT FOREARM  Final   Special Requests   Final    BOTTLES DRAWN AEROBIC AND ANAEROBIC Blood Culture results may not  be optimal due to an excessive volume of blood received in culture bottles   Culture   Final    NO GROWTH < 12 HOURS Performed at [facility], [address] [place], [place] [address]    Report Status PENDING  Incomplete    Labs: CBC: Recent Labs  Lab 10/14/22 1438 10/15/22 0335  WBC 20.1* 15.8*  HGB 14.3 13.3  HCT 43.3 40.6  MCV 91.4 91.6  PLT 249 485   Basic Metabolic Panel: Recent Labs  Lab 10/14/22 1438 10/15/22 0335  NA 138 140  K 3.7 4.0  CL 100 102  CO2 26 27  GLUCOSE 122* 141*  BUN 14 12  CREATININE 0.80 0.74  CALCIUM 9.1 8.8*   Liver Function Tests: No results for input(s): "AST", "ALT", "ALKPHOS", "BILITOT", "PROT", "ALBUMIN" in the last 168 hours. CBG: Recent Labs  Lab 10/14/22 2309 10/15/22 0759 10/15/22 1149  GLUCAP 128* 227* 126*    Discharge time spent: less than 30 minutes.  Signed: Annita Brod, MD [facility] 10/15/2022

## 2022-10-18 ENCOUNTER — Telehealth: Payer: Self-pay | Admitting: *Deleted

## 2022-10-18 NOTE — Patient Outreach (Signed)
  Care Coordination TOC Note Transition Care Management Unsuccessful Follow-up Telephone Call  Date of discharge and from where:  [facility] [phone] Acute resp failure with hypoxia  Attempts:  1st Attempt  Reason for unsuccessful TCM follow-up call:  Left voice message  [place] Management [phone]

## 2022-10-19 ENCOUNTER — Telehealth: Payer: Self-pay | Admitting: *Deleted

## 2022-10-19 LAB — CULTURE, BLOOD (ROUTINE X 2)
Culture: NO GROWTH
Culture: NO GROWTH

## 2022-10-19 NOTE — Patient Outreach (Signed)
  Care Coordination [facility] Note Transition Care Management Unsuccessful Follow-up Telephone Call  Date of discharge and from where:  [facility] [phone] Acute respiratory failure with hypoxia  Attempts:  2nd Attempt  Reason for unsuccessful TCM follow-up call:  Left voice message [place] [facility] [phone].

## 2022-10-20 ENCOUNTER — Telehealth: Payer: Self-pay | Admitting: *Deleted

## 2022-10-20 NOTE — Patient Outreach (Addendum)
  Care Coordination [facility] Note Transition Care Management Unsuccessful Follow-up Telephone Call  Date of discharge and from where:  [facility] [phone] Acute respiratory failure with hypoxia  Attempts:  3rd Attempt  Reason for unsuccessful TCM follow-up call:  Left voice message  [place] Management [phone].

## 2022-10-20 NOTE — Patient Outreach (Signed)
  Care Coordination TOC Note Transition Care Management Follow-up Telephone Call Date of discharge and from where: [phone] Acute Respiratory Failure with Hypoxia How have you been since you were released from the hospital? Doing good. I know what my problem was and I have completely stopped smoking.  Any questions or concerns? No  Items Reviewed: Did the pt receive and understand the discharge instructions provided? Yes  Medications obtained and verified? Yes  Other? No  Any new allergies since your discharge? No  Dietary orders reviewed? No Do you have support at home? Yes   Home Care and Equipment/Supplies: Were home health services ordered? no If so, what is the name of the agency? N   Has the agency set up a time to come to the patient's home? not applicable Were any new equipment or medical supplies ordered?  No What is the name of the medical supply agency? n  Were you able to get the supplies/equipment? not applicable Do you have any questions related to the use of the equipment or supplies? No  Functional Questionnaire: (I = Independent and D = Dependent) ADLs: I  Bathing/Dressing- I  Meal Prep- I  Eating- I  Maintaining continence- I  Transferring/Ambulation- I  Managing Meds- I  Follow up appointments reviewed:  PCP Hospital f/u appt confirmed? Yes  Scheduled to see Alma Friendly NP [phone] 2:00 . [facility] f/u appt confirmed? No  . Are transportation arrangements needed? No  If their condition worsens, is the pt aware to call PCP or go to the Emergency Dept.? Yes Was the patient provided with contact information for the PCP's office or ED? Yes Was to pt encouraged to call back with questions or concerns? Yes  SDOH assessments and interventions completed:   Yes SDOH Interventions Today    Flowsheet Row Most Recent Value  SDOH Interventions   Food Insecurity Interventions Intervention Not Indicated  Housing Interventions Intervention Not  Indicated  Transportation Interventions Intervention Not Indicated       Care Coordination Interventions:  PCP follow up appointment requested RN discussed stop smoking and patient stated he has now stopped cold [place] with no aids    Encounter Outcome:  Pt. Visit Completed    [place] Management [phone]

## 2022-10-20 NOTE — Progress Notes (Signed)
  Care Coordination  Note  10/20/2022 Name: [name] MRN: [id number] DOB: 02/21/1961  Roy Richards is a 62 y.o. year old primary care patient of Pleas Koch, NP.   Follow up plan: Hospital Follow Up appointment scheduled with [name], Leticia Penna, NP) on (10/22/22) at 2:00 PM aware per [facility].  [place]  Direct Dial: [phone]

## 2022-10-22 ENCOUNTER — Encounter: Payer: Self-pay | Admitting: Primary Care

## 2022-10-22 ENCOUNTER — Ambulatory Visit (INDEPENDENT_AMBULATORY_CARE_PROVIDER_SITE_OTHER): Payer: Medicare HMO | Admitting: Primary Care

## 2022-10-22 VITALS — BP 144/78 | HR 73 | Temp 98.2°F | Ht 65.0 in | Wt 131.0 lb

## 2022-10-22 DIAGNOSIS — J449 Chronic obstructive pulmonary disease, unspecified: Secondary | ICD-10-CM | POA: Diagnosis not present

## 2022-10-22 NOTE — Progress Notes (Signed)
Subjective:    Patient ID: JAMAHRI Richards, male    DOB: 12/13/1960, 62 y.o.   MRN: [id number]  HPI  Roy Richards is a very pleasant 62 y.o. male with a history of hypertension, COPD, type 2 diabetes, tobacco dependence who presents today for hospital follow up.  He presented to [facility] ED on 10/14/22 for a four day history of not feeling well with increased SOB and home oxygen saturation levels of 81%. He was noticed to be hypoxic on RA in triage so he was placed on 2 liters of oxygen.   During his stay in the ED he was found to be septic with WBC count in the 20's. He underwent chest xray which showed bronchitis or reactive airway disease. He was admitted for further evaluation.   During his hospital stay he tested negative for Covid, Flu, and RSV. He was treated for COPD exacerbation with nebulizer treatment and steroids. Sepsis was rule out later. Symptoms improved so he was discharged home on 10/15/22 with Augmentin 875-125 BID x 2 days and prednisone taper. It was also recommended he establish with pulmonology.   Since his hospital visit he's completed his antibiotics and prednisone. He is tracking his oxygen saturation which runs in the high 80's when resting and low 90's.   He quit smoking 8 days ago. He is compliant to his Advair 250-50 mcg BID. He is using the albuterol inhaler several times weekly. He's feeling much better. He does not wish to see a pulmonologist as he doesn't feel that he needs one.    Review of Systems  Respiratory:  Negative for cough, shortness of breath and wheezing.   Cardiovascular:  Negative for chest pain.  Neurological:  Negative for dizziness.         Past Medical History:  Diagnosis Date   Acute colitis 06/26/2019   Diabetes mellitus without complication ([place])    Hyperlipidemia    Hypertension     Social History   Socioeconomic History   Marital status: Single    Spouse name: Not on file   Number of children: Not on file   Years of  education: Not on file   Highest education level: Not on file  Occupational History   Not on file  Tobacco Use   Smoking status: Former    Packs/day: 0.50    Years: 25.00    Total pack years: 12.50    Types: Cigarettes   Smokeless tobacco: Never  Vaping Use   Vaping Use: Never used  Substance and Sexual Activity   Alcohol use: Yes    Comment: occasionally   Drug use: Never   Sexual activity: Yes  Other Topics Concern   Not on file  Social History Narrative   Not on file   Social Determinants of Health   Financial Resource Strain: Not on file  Food Insecurity: No Food Insecurity (10/20/2022)   Hunger Vital Sign    Worried About Running Out of Food in the Last Year: Never true    Ran Out of Food in the Last Year: Never true  Transportation Needs: No Transportation Needs (10/20/2022)   PRAPARE - Hydrologist (Medical): No    Lack of Transportation (Non-Medical): No  Physical Activity: Not on file  Stress: Not on file  Social Connections: Not on file  Intimate Partner Violence: Not At Risk (10/15/2022)   Humiliation, Afraid, Rape, and Kick questionnaire    Fear of Current or Ex-Partner: No  Emotionally Abused: No    Physically Abused: No    Sexually Abused: No    Past Surgical History:  Procedure Laterality Date   HERNIA REPAIR      Family History  Problem Relation Age of Onset   COPD Mother    Heart disease Father    Hyperlipidemia Father    Hypertension Father     No Known Allergies  Current Outpatient Medications on File Prior to Visit  Medication Sig Dispense Refill   albuterol (VENTOLIN HFA) 108 (90 Base) MCG/ACT inhaler Inhale 1-2 puffs into the lungs every 6 (six) hours as needed for wheezing or shortness of breath. 18 g 0   amLODipine (NORVASC) 10 MG tablet TAKE ONE TABLET BY MOUTH DAILY FOR BLOOD PRESSURE 90 tablet 3   aspirin 81 MG chewable tablet Chew 81 mg by mouth daily.     atorvastatin (LIPITOR) 40 MG tablet Take 1  tablet (40 mg total) by mouth daily. for cholesterol. 90 tablet 2   Cholecalciferol (VITAMIN D) 50 MCG (2000 UT) tablet Take 2,000 Units by mouth daily.     citalopram (CELEXA) 40 MG tablet TAKE ONE TABLET BY MOUTH DAILY FOR ANXIETY 90 tablet 2   fluticasone-salmeterol (ADVAIR DISKUS) 250-50 MCG/ACT AEPB Inhale 1 puff into the lungs in the morning and at bedtime. 3 each 3   lisinopril (ZESTRIL) 20 MG tablet TAKE ONE TABLET BY MOUTH DAILY 90 tablet 2   metFORMIN (GLUCOPHAGE) 500 MG tablet TAKE 1 TABLET BY MOUTH TWICE A DAY WITH A MEAL FOR DIABETES 180 tablet 1   Multiple Vitamins-Minerals (MULTIVITAMIN ADULT PO) Take by mouth.     sildenafil (VIAGRA) 100 MG tablet Take 1/2 to 1 tablet by mouth 30 min prior to sexual activity 30 tablet 0   No current facility-administered medications on file prior to visit.    BP (!) 144/78   Pulse 73   Temp 98.2 F (36.8 C) (Temporal)   Ht 5' 5"$  (1.651 m)   Wt 131 lb (59.4 kg)   SpO2 94%   BMI 21.80 kg/m  Objective:   Physical Exam Constitutional:      Appearance: He is not ill-appearing.  Cardiovascular:     Rate and Rhythm: Normal rate and regular rhythm.  Pulmonary:     Effort: Pulmonary effort is normal.     Breath sounds: Normal breath sounds. No wheezing or rales.  Musculoskeletal:     Cervical back: Neck supple.  Skin:    General: Skin is warm and dry.  Neurological:     Mental Status: He is alert and oriented to person, place, and time.           Assessment & Plan:  Chronic obstructive pulmonary disease, unspecified COPD type ([place]) Assessment & Plan: With acute exacerbation. Recent hospital labs, notes, imaging reviewed.  Exam today stable, no acute distress. Continue Advair 250-50 mcg BID. Continue albuterol inhaler PRN.   Discussed option for home nebulizer treatments, he kindly declines. Offered referral to pulmonology, he kindly declines.   Follow up in March as scheduled.          Pleas Koch,  NP

## 2022-10-22 NOTE — Patient Instructions (Signed)
Continue your Advair twice daily.  Use the albuterol only if needed.  It was a pleasure to see you today!

## 2022-10-22 NOTE — Assessment & Plan Note (Signed)
With acute exacerbation. Recent hospital labs, notes, imaging reviewed.  Exam today stable, no acute distress. Continue Advair 250-50 mcg BID. Continue albuterol inhaler PRN.   Discussed option for home nebulizer treatments, he kindly declines. Offered referral to pulmonology, he kindly declines.   Follow up in March as scheduled.

## 2022-11-11 ENCOUNTER — Other Ambulatory Visit: Payer: Self-pay | Admitting: Primary Care

## 2022-11-11 DIAGNOSIS — I10 Essential (primary) hypertension: Secondary | ICD-10-CM

## 2022-11-11 DIAGNOSIS — E785 Hyperlipidemia, unspecified: Secondary | ICD-10-CM

## 2022-11-23 ENCOUNTER — Encounter: Payer: Managed Care, Other (non HMO) | Admitting: Primary Care

## 2022-11-30 ENCOUNTER — Encounter: Payer: Self-pay | Admitting: Primary Care

## 2022-11-30 ENCOUNTER — Ambulatory Visit (INDEPENDENT_AMBULATORY_CARE_PROVIDER_SITE_OTHER): Payer: Medicare HMO | Admitting: Primary Care

## 2022-11-30 VITALS — BP 114/58 | HR 67 | Temp 98.4°F | Ht 65.0 in | Wt 141.0 lb

## 2022-11-30 DIAGNOSIS — I10 Essential (primary) hypertension: Secondary | ICD-10-CM

## 2022-11-30 DIAGNOSIS — I700 Atherosclerosis of aorta: Secondary | ICD-10-CM | POA: Diagnosis not present

## 2022-11-30 DIAGNOSIS — I6523 Occlusion and stenosis of bilateral carotid arteries: Secondary | ICD-10-CM | POA: Diagnosis not present

## 2022-11-30 DIAGNOSIS — F32A Depression, unspecified: Secondary | ICD-10-CM

## 2022-11-30 DIAGNOSIS — J449 Chronic obstructive pulmonary disease, unspecified: Secondary | ICD-10-CM | POA: Diagnosis not present

## 2022-11-30 DIAGNOSIS — D229 Melanocytic nevi, unspecified: Secondary | ICD-10-CM | POA: Diagnosis not present

## 2022-11-30 DIAGNOSIS — E1159 Type 2 diabetes mellitus with other circulatory complications: Secondary | ICD-10-CM

## 2022-11-30 DIAGNOSIS — Z0000 Encounter for general adult medical examination without abnormal findings: Secondary | ICD-10-CM | POA: Diagnosis not present

## 2022-11-30 DIAGNOSIS — E785 Hyperlipidemia, unspecified: Secondary | ICD-10-CM

## 2022-11-30 DIAGNOSIS — N529 Male erectile dysfunction, unspecified: Secondary | ICD-10-CM

## 2022-11-30 DIAGNOSIS — F419 Anxiety disorder, unspecified: Secondary | ICD-10-CM

## 2022-11-30 DIAGNOSIS — I701 Atherosclerosis of renal artery: Secondary | ICD-10-CM

## 2022-11-30 DIAGNOSIS — Z125 Encounter for screening for malignant neoplasm of prostate: Secondary | ICD-10-CM

## 2022-11-30 LAB — COMPREHENSIVE METABOLIC PANEL
ALT: 17 U/L (ref 0–53)
AST: 15 U/L (ref 0–37)
Albumin: 4 g/dL (ref 3.5–5.2)
Alkaline Phosphatase: 95 U/L (ref 39–117)
BUN: 13 mg/dL (ref 6–23)
CO2: 30 mEq/L (ref 19–32)
Calcium: 9 mg/dL (ref 8.4–10.5)
Chloride: 102 mEq/L (ref 96–112)
Creatinine, Ser: 0.76 mg/dL (ref 0.40–1.50)
GFR: 95.68 mL/min (ref >60.00)
Glucose, Bld: 93 mg/dL (ref 70–99)
Potassium: 3.9 mEq/L (ref 3.5–5.1)
Sodium: 142 mEq/L (ref 135–145)
Total Bilirubin: 0.5 mg/dL (ref 0.2–1.2)
Total Protein: 6.4 g/dL (ref 6.0–8.3)

## 2022-11-30 LAB — LIPID PANEL
Cholesterol: 93 mg/dL (ref 0–200)
HDL: 56.7 mg/dL (ref >39.00)
LDL Cholesterol: 16 mg/dL (ref 0–99)
NonHDL: 36.62
Total CHOL/HDL Ratio: 2
Triglycerides: 102 mg/dL (ref 0.0–149.0)
VLDL: 20.4 mg/dL (ref 0.0–40.0)

## 2022-11-30 LAB — HEMOGLOBIN A1C: Hgb A1c MFr Bld: 6.2 % (ref 4.6–6.5)

## 2022-11-30 LAB — PSA, MEDICARE: PSA: 0.23 ng/ml (ref 0.10–4.00)

## 2022-11-30 MED ORDER — ALBUTEROL SULFATE HFA 108 (90 BASE) MCG/ACT IN AERS: 1.0000 | INHALATION_SPRAY | Freq: Four times a day (QID) | RESPIRATORY_TRACT | 0 refills | Status: DC | PRN

## 2022-11-30 NOTE — Assessment & Plan Note (Signed)
-  Continue atorvastatin 40 mg daily and aspirin 81 mg daily

## 2022-11-30 NOTE — Assessment & Plan Note (Signed)
BP improved and at goal today. Declines further follow up or imaging.  Continue BP control.

## 2022-11-30 NOTE — Assessment & Plan Note (Signed)
Controlled.  Continue Sildenafil 50-100 mg PRN.

## 2022-11-30 NOTE — Assessment & Plan Note (Signed)
Controlled.  Continue Advair 250-50 mcg BID and albuterol inhaler PRN. Reviewed CT chest from September 2023.  Declines pulmonology referral.

## 2022-11-30 NOTE — Assessment & Plan Note (Signed)
Immunizations UTD. Declines Shingrix vaccine. Colonoscopy UTD, due 2025 Lung cancer screening UTD. PSA due and pending.  Discussed the importance of a healthy diet and regular exercise in order for weight loss, and to reduce the risk of further co-morbidity.  Exam stable. Labs pending.  Follow up in 1 year for repeat physical.

## 2022-11-30 NOTE — Assessment & Plan Note (Signed)
Controlled.  Continue citalopram 20 mg daily, he declines Rx for 20 mg pill at this time but will update when ready.

## 2022-11-30 NOTE — Assessment & Plan Note (Signed)
Repeat lipid panel pending.  Discussed the importance of a healthy diet and regular exercise in order for weight loss, and to reduce the risk of further co-morbidity. Continue atorvastatin 40 mg daily.

## 2022-11-30 NOTE — Progress Notes (Signed)
Subjective:    Patient ID: Roy Richards, male    DOB: December 18, 1960, 62 y.o.   MRN: [id number]  HPI  Roy Richards is a very pleasant 62 y.o. male who presents today for complete physical and follow up of chronic conditions.  He would also like a referral to dermatology for multiple nevi.   Immunizations: -Tetanus: Completed in 2022 -Influenza: Completed 2 seasons ago -Shingles: Never completed  -Pneumonia: Completed pneumovax in 2022  Diet: [place].  Exercise: No regular exercise.  Eye exam: Completes annually  Dental exam: Completes semi-annually    Colonoscopy: Completed in 2022, due 2025 Lung Cancer Screening: Completed in September 2023, due September 2024  PSA: Due     BP Readings from Last 3 Encounters:  11/30/22 (!) 114/58  10/22/22 (!) 144/78  10/15/22 126/72        Review of Systems  Constitutional:  Negative for unexpected weight change.  HENT:  Negative for rhinorrhea.   Respiratory:  Negative for cough and shortness of breath.   Cardiovascular:  Negative for chest pain.  Gastrointestinal:  Negative for constipation and diarrhea.  Genitourinary:  Negative for difficulty urinating.  Musculoskeletal:  Positive for arthralgias.  Skin:  Negative for rash.  Allergic/Immunologic: Negative for environmental allergies.  Neurological:  Negative for dizziness, numbness and headaches.  Psychiatric/Behavioral:  The patient is not nervous/anxious.          Past Medical History:  Diagnosis Date   Acute colitis 06/26/2019   Diabetes mellitus without complication ([place])    Hyperlipidemia    Hypertension    Hypoxia 05/07/2020    Social History   Socioeconomic History   Marital status: Single    Spouse name: Not on file   Number of children: Not on file   Years of education: Not on file   Highest education level: Not on file  Occupational History   Not on file  Tobacco Use   Smoking status: Former    Packs/day: 0.50    Years: 25.00    Additional  pack years: 0.00    Total pack years: 12.50    Types: Cigarettes   Smokeless tobacco: Never  Vaping Use   Vaping Use: Never used  Substance and Sexual Activity   Alcohol use: Yes    Comment: occasionally   Drug use: Never   Sexual activity: Yes  Other Topics Concern   Not on file  Social History Narrative   Not on file   Social Determinants of Health   Financial Resource Strain: Not on file  Food Insecurity: No Food Insecurity (10/20/2022)   Hunger Vital Sign    Worried About Running Out of Food in the Last Year: Never true    Ran Out of Food in the Last Year: Never true  Transportation Needs: No Transportation Needs (10/20/2022)   PRAPARE - Hydrologist (Medical): No    Lack of Transportation (Non-Medical): No  Physical Activity: Not on file  Stress: Not on file  Social Connections: Not on file  Intimate Partner Violence: Not At Risk (10/15/2022)   Humiliation, Afraid, Rape, and Kick questionnaire    Fear of Current or Ex-Partner: No    Emotionally Abused: No    Physically Abused: No    Sexually Abused: No    Past Surgical History:  Procedure Laterality Date   HERNIA REPAIR      Family History  Problem Relation Age of Onset   COPD Mother  Heart disease Father    Hyperlipidemia Father    Hypertension Father     No Known Allergies  Current Outpatient Medications on File Prior to Visit  Medication Sig Dispense Refill   amLODipine (NORVASC) 10 MG tablet TAKE ONE TABLET BY MOUTH DAILY FOR BLOOD PRESSURE 90 tablet 3   aspirin 81 MG chewable tablet Chew 81 mg by mouth daily.     atorvastatin (LIPITOR) 40 MG tablet TAKE 1 TABLET BY MOUTH DAILY FOR CHOLESTEROL 90 tablet 0   Cholecalciferol (VITAMIN D) 50 MCG (2000 UT) tablet Take 2,000 Units by mouth daily.     citalopram (CELEXA) 40 MG tablet TAKE ONE TABLET BY MOUTH DAILY FOR ANXIETY 90 tablet 2   fluticasone-salmeterol (ADVAIR DISKUS) 250-50 MCG/ACT AEPB Inhale 1 puff into the lungs in  the morning and at bedtime. 3 each 3   lisinopril (ZESTRIL) 20 MG tablet TAKE 1 TABLET BY MOUTH DAILY 90 tablet 0   metFORMIN (GLUCOPHAGE) 500 MG tablet TAKE 1 TABLET BY MOUTH TWICE A DAY WITH A MEAL FOR DIABETES 180 tablet 1   Multiple Vitamins-Minerals (MULTIVITAMIN ADULT PO) Take by mouth.     sildenafil (VIAGRA) 100 MG tablet Take 1/2 to 1 tablet by mouth 30 min prior to sexual activity 30 tablet 0   No current facility-administered medications on file prior to visit.    BP (!) 114/58   Pulse 67   Temp 98.4 F (36.9 C) (Temporal)   Ht 5\' 5"  (1.651 m)   Wt 141 lb (64 kg)   SpO2 94%   BMI 23.46 kg/m  Objective:   Physical Exam HENT:     Right Ear: Tympanic membrane and ear canal normal.     Left Ear: Tympanic membrane and ear canal normal.     Nose: Nose normal.     Right Sinus: No maxillary sinus tenderness or frontal sinus tenderness.     Left Sinus: No maxillary sinus tenderness or frontal sinus tenderness.  Eyes:     Conjunctiva/sclera: Conjunctivae normal.  Neck:     Thyroid: No thyromegaly.     Vascular: No carotid bruit.  Cardiovascular:     Rate and Rhythm: Normal rate and regular rhythm.     Heart sounds: Normal heart sounds.  Pulmonary:     Effort: Pulmonary effort is normal.     Breath sounds: Normal breath sounds. No wheezing or rales.  Abdominal:     General: Bowel sounds are normal.     Palpations: Abdomen is soft.     Tenderness: There is no abdominal tenderness.  Musculoskeletal:        General: Normal range of motion.     Cervical back: Neck supple.  Skin:    General: Skin is warm and dry.  Neurological:     Mental Status: He is alert and oriented to person, place, and time.     Cranial Nerves: No cranial nerve deficit.     Deep Tendon Reflexes: Reflexes are normal and symmetric.  Psychiatric:        Mood and Affect: Mood normal.           Assessment & Plan:  Type 2 diabetes mellitus with other circulatory complication, without long-term  current use of insulin (HCC) Assessment & Plan: Repeat A1C pending. Continue metformin 500 mg BID.  Orders: -     Hemoglobin A1c  Chronic obstructive pulmonary disease, unspecified COPD type ([place]) Assessment & Plan: Controlled.  Continue Advair 250-50 mcg BID and albuterol inhaler PRN.  Reviewed CT chest from September 2023.  Declines pulmonology referral.   Orders: -     Albuterol Sulfate HFA; Inhale 1-2 puffs into the lungs every 6 (six) hours as needed for wheezing or shortness of breath.  Dispense: 18 g; Refill: 0  Essential hypertension Assessment & Plan: Controlled.  Continue amlodipine 10 mg daily and lisinopril 20 mg daily. CMP pending.   Orders: -     Comprehensive metabolic panel  Hyperlipidemia, unspecified hyperlipidemia type Assessment & Plan: Repeat lipid panel pending.  Discussed the importance of a healthy diet and regular exercise in order for weight loss, and to reduce the risk of further co-morbidity. Continue atorvastatin 40 mg daily.   Orders: -     Lipid panel  Screening for prostate cancer -     PSA, Medicare  Multiple nevi -     Ambulatory referral to Dermatology  Aortic atherosclerosis [facility]) Assessment & Plan: Continue atorvastatin 40 mg daily and aspirin 81 mg daily.    Bilateral carotid artery stenosis Assessment & Plan: Continues to decline further imaging. Continue atorvastatin 40 mg and aspirin 81 mg daily.  Repeat lipid panel pending for LDL goal of <70   Renal artery stenosis (HCC) Assessment & Plan: BP improved and at goal today. Declines further follow up or imaging.  Continue BP control.   Anxiety and depression Assessment & Plan: Controlled.  Continue citalopram 20 mg daily, he declines Rx for 20 mg pill at this time but will update when ready.   Erectile dysfunction, unspecified erectile dysfunction type Assessment & Plan: Controlled.  Continue Sildenafil 50-100 mg PRN.   Preventative health  care Assessment & Plan: Immunizations UTD. Declines Shingrix vaccine. Colonoscopy UTD, due 2025 Lung cancer screening UTD. PSA due and pending.  Discussed the importance of a healthy diet and regular exercise in order for weight loss, and to reduce the risk of further co-morbidity.  Exam stable. Labs pending.  Follow up in 1 year for repeat physical.          Pleas Koch, NP

## 2022-11-30 NOTE — Assessment & Plan Note (Signed)
Continues to decline further imaging. Continue atorvastatin 40 mg and aspirin 81 mg daily.  Repeat lipid panel pending for LDL goal of <70

## 2022-11-30 NOTE — Patient Instructions (Signed)
You will either be contacted via phone regarding your referral to dermatology, or you may receive a letter on your MyChart portal from our referral team with instructions for scheduling an appointment. Please let us know if you have not been contacted by anyone within two weeks.  Stop by the lab prior to leaving today. I will notify you of your results once received.   Please schedule a follow up visit for 6 months for a diabetes check.  It was a pleasure to see you today!

## 2022-11-30 NOTE — Assessment & Plan Note (Signed)
Repeat A1C pending. Continue metformin 500 mg BID.

## 2022-11-30 NOTE — Assessment & Plan Note (Signed)
Controlled.  Continue amlodipine 10 mg daily and lisinopril 20 mg daily. CMP pending.

## 2022-12-03 ENCOUNTER — Telehealth: Payer: Self-pay | Admitting: Primary Care

## 2022-12-03 DIAGNOSIS — J449 Chronic obstructive pulmonary disease, unspecified: Secondary | ICD-10-CM

## 2022-12-03 MED ORDER — FLUTICASONE-SALMETEROL 250-50 MCG/ACT IN AEPB: 1.0000 | INHALATION_SPRAY | Freq: Two times a day (BID) | RESPIRATORY_TRACT | 11 refills | Status: DC

## 2022-12-03 NOTE — Telephone Encounter (Signed)
Prescription Request  12/03/2022  LOV: 11/30/2022  What is the name of the medication or equipment? fluticasone-salmeterol (ADVAIR DISKUS) 250-50 MCG/ACT AEPB   Have you contacted your pharmacy to request a refill? Yes   Which pharmacy would you like this sent to?  [facility] [id number], [place] [id number] Phone: [phone] Fax: [phone]     Patient notified that their request is being sent to the clinical staff for review and that they should receive a response within 2 business days.   Please advise at Mobile [phone] (mobile)

## 2022-12-06 NOTE — Telephone Encounter (Signed)
Called and advised patient to check with pharmacy.

## 2022-12-06 NOTE — Telephone Encounter (Signed)
Please call patient:  I sent plenty of refills of his Advair inhaler to Roy Richards on 12/03/22. Has he contacted Roy Richards?

## 2022-12-16 ENCOUNTER — Telehealth: Payer: Self-pay | Admitting: Primary Care

## 2022-12-16 NOTE — Telephone Encounter (Signed)
Contacted Rico Junker to schedule their annual wellness visit. Appointment made for 01/05/2023.  [place] Direct Dial: [phone]

## 2022-12-16 NOTE — Telephone Encounter (Signed)
Called patient to schedule Medicare Annual Wellness Visit (AWV). Left message for patient to call back and schedule Medicare Annual Wellness Visit (AWV).  AWV-I DUE AS OF : 10/14/2022  Please schedule an appointment at any time with [facility] .  If any questions, please contact me at [phone].  Thank you ,  [place] Direct Dial: [phone]

## 2022-12-26 ENCOUNTER — Other Ambulatory Visit: Payer: Self-pay | Admitting: Primary Care

## 2022-12-26 DIAGNOSIS — F419 Anxiety disorder, unspecified: Secondary | ICD-10-CM

## 2022-12-26 DIAGNOSIS — F32A Depression, unspecified: Secondary | ICD-10-CM

## 2022-12-30 ENCOUNTER — Encounter: Payer: Self-pay | Admitting: Primary Care

## 2023-01-05 ENCOUNTER — Ambulatory Visit (INDEPENDENT_AMBULATORY_CARE_PROVIDER_SITE_OTHER): Payer: Medicare HMO

## 2023-01-05 ENCOUNTER — Other Ambulatory Visit: Payer: Self-pay | Admitting: Primary Care

## 2023-01-05 VITALS — Ht 64.0 in | Wt 130.0 lb

## 2023-01-05 DIAGNOSIS — Z0000 Encounter for general adult medical examination without abnormal findings: Secondary | ICD-10-CM

## 2023-01-05 DIAGNOSIS — I10 Essential (primary) hypertension: Secondary | ICD-10-CM

## 2023-01-05 NOTE — Patient Instructions (Addendum)
[name] , Thank you for taking time to come for your Medicare Wellness Visit. I appreciate your ongoing commitment to your health goals. Please review the following plan we discussed and let me know if I can assist you in the future.   These are the goals we discussed:  Goals      Patient Stated     No new Goals.        This is a list of the screening recommended for you and due dates:  Health Maintenance  Topic Date Due   COVID-19 Vaccine (4 - 2023-24 season) 01/15/2023*   Zoster (Shingles) Vaccine (1 of 2) 01/20/2023*   Eye exam for diabetics  12/30/2023*   Flu Shot  04/14/2023   Hemoglobin A1C  06/02/2023   Yearly kidney health urinalysis for diabetes  06/03/2023   Complete foot exam   06/03/2023   Yearly kidney function blood test for diabetes  11/30/2023   Medicare Annual Wellness Visit  01/05/2024   DTaP/Tdap/Td vaccine (2 - Td or Tdap) 05/22/2031   Colon Cancer Screening  06/03/2031   Hepatitis C Screening: USPSTF Recommendation to screen - Ages 36-79 yo.  Completed   HIV Screening  Completed   HPV Vaccine  Aged Out  *Topic was postponed. The date shown is not the original due date.    Advanced directives: none  Conditions/risks identified: Aim for 30 minutes of exercise or brisk walking, 6-8 glasses of water, and 5 servings of fruits and vegetables each day.   If you wish to quit smoking, help is available. For free tobacco cessation program offerings call the [facility] at 502-133-1519 or Live Well Line at 925-493-7620. You may also visit www.[place].com or email livelifewell@[place] .com for more information on other programs.   You may also call 1-800-QUIT-NOW (510-611-6734) or visit www.[place].ch or www.[facility].org for additional resources on smoking cessation.    Next appointment: Follow up in one year for your annual wellness visit 01/09/24 @ 3:00 tevevisit  Preventive Care 40-64 Years, Male Preventive care refers to lifestyle  choices and visits with your health care provider that can promote health and wellness. What does preventive care include? A yearly physical exam. This is also called an annual well check. Dental exams once or twice a year. Routine eye exams. Ask your health care provider how often you should have your eyes checked. Personal lifestyle choices, including: Daily care of your teeth and gums. Regular physical activity. Eating a healthy diet. Avoiding tobacco and drug use. Limiting alcohol use. Practicing safe sex. Taking low-dose aspirin every day starting at age 66. What happens during an annual well check? The services and screenings done by your health care provider during your annual well check will depend on your age, overall health, lifestyle risk factors, and family history of disease. Counseling  Your health care provider may ask you questions about your: Alcohol use. Tobacco use. Drug use. Emotional well-being. Home and relationship well-being. Sexual activity. Eating habits. Work and work Astronomer. Screening  You may have the following tests or measurements: Height, weight, and BMI. Blood pressure. Lipid and cholesterol levels. These may be checked every 5 years, or more frequently if you are over 57 years old. Skin check. Lung cancer screening. You may have this screening every year starting at age 18 if you have a 30-pack-year history of smoking and currently smoke or have quit within the past 15 years. Fecal occult blood test (FOBT) of the stool. You may have this  test every year starting at age 77. Flexible sigmoidoscopy or colonoscopy. You may have a sigmoidoscopy every 5 years or a colonoscopy every 10 years starting at age 40. Prostate cancer screening. Recommendations will vary depending on your family history and other risks. Hepatitis C blood test. Hepatitis B blood test. Sexually transmitted disease (STD) testing. Diabetes screening. This is done by checking  your blood sugar (glucose) after you have not eaten for a while (fasting). You may have this done every 1-3 years. Discuss your test results, treatment options, and if necessary, the need for more tests with your health care provider. Vaccines  Your health care provider may recommend certain vaccines, such as: Influenza vaccine. This is recommended every year. Tetanus, diphtheria, and acellular pertussis (Tdap, Td) vaccine. You may need a Td booster every 10 years. Zoster vaccine. You may need this after age 82. Pneumococcal 13-valent conjugate (PCV13) vaccine. You may need this if you have certain conditions and have not been vaccinated. Pneumococcal polysaccharide (PPSV23) vaccine. You may need one or two doses if you smoke cigarettes or if you have certain conditions. Talk to your health care provider about which screenings and vaccines you need and how often you need them. This information is not intended to replace advice given to you by your health care provider. Make sure you discuss any questions you have with your health care provider. Document Released: 09/26/2015 Document Revised: 05/19/2016 Document Reviewed: 07/01/2015 Elsevier Interactive Patient Education  2017 [employer].  Fall Prevention in the Home Falls can cause injuries. They can happen to people of all ages. There are many things you can do to make your home safe and to help prevent falls. What can I do on the outside of my home? Regularly fix the edges of walkways and driveways and fix any cracks. Remove anything that might make you trip as you walk through a door, such as a raised step or threshold. Trim any bushes or trees on the path to your home. Use bright outdoor lighting. Clear any walking paths of anything that might make someone trip, such as rocks or tools. Regularly check to see if handrails are loose or broken. Make sure that both sides of any steps have handrails. Any raised decks and porches should have  guardrails on the edges. Have any leaves, snow, or ice cleared regularly. Use sand or salt on walking paths during winter. Clean up any spills in your garage right away. This includes oil or grease spills. What can I do in the bathroom? Use night lights. Install grab bars by the toilet and in the tub and shower. Do not use towel bars as grab bars. Use non-skid mats or decals in the tub or shower. If you need to sit down in the shower, use a plastic, non-slip stool. Keep the floor dry. Clean up any water that spills on the floor as soon as it happens. Remove soap buildup in the tub or shower regularly. Attach bath mats securely with double-sided non-slip rug tape. Do not have throw rugs and other things on the floor that can make you trip. What can I do in the bedroom? Use night lights. Make sure that you have a light by your bed that is easy to reach. Do not use any sheets or blankets that are too big for your bed. They should not hang down onto the floor. Have a firm chair that has side arms. You can use this for support while you get dressed. Do not have  throw rugs and other things on the floor that can make you trip. What can I do in the kitchen? Clean up any spills right away. Avoid walking on wet floors. Keep items that you use a lot in easy-to-reach places. If you need to reach something above you, use a strong step stool that has a grab bar. Keep electrical cords out of the way. Do not use floor polish or wax that makes floors slippery. If you must use wax, use non-skid floor wax. Do not have throw rugs and other things on the floor that can make you trip. What can I do with my stairs? Do not leave any items on the stairs. Make sure that there are handrails on both sides of the stairs and use them. Fix handrails that are broken or loose. Make sure that handrails are as long as the stairways. Check any carpeting to make sure that it is firmly attached to the stairs. Fix any carpet  that is loose or worn. Avoid having throw rugs at the top or bottom of the stairs. If you do have throw rugs, attach them to the floor with carpet tape. Make sure that you have a light switch at the top of the stairs and the bottom of the stairs. If you do not have them, ask someone to add them for you. What else can I do to help prevent falls? Wear shoes that: Do not have high heels. Have rubber bottoms. Are comfortable and fit you well. Are closed at the toe. Do not wear sandals. If you use a stepladder: Make sure that it is fully opened. Do not climb a closed stepladder. Make sure that both sides of the stepladder are locked into place. Ask someone to hold it for you, if possible. Clearly mark and make sure that you can see: Any grab bars or handrails. First and last steps. Where the edge of each step is. Use tools that help you move around (mobility aids) if they are needed. These include: Canes. Walkers. Scooters. Crutches. Turn on the lights when you go into a dark area. Replace any light bulbs as soon as they burn out. Set up your furniture so you have a clear path. Avoid moving your furniture around. If any of your floors are uneven, fix them. If there are any pets around you, be aware of where they are. Review your medicines with your doctor. Some medicines can make you feel dizzy. This can increase your chance of falling. Ask your doctor what other things that you can do to help prevent falls. This information is not intended to replace advice given to you by your health care provider. Make sure you discuss any questions you have with your health care provider. Document Released: 06/26/2009 Document Revised: 02/05/2016 Document Reviewed: 10/04/2014 Elsevier Interactive Patient Education  2017 [employer].

## 2023-01-05 NOTE — Progress Notes (Signed)
I connected with  Roy Richards on 01/05/23 by a audio enabled telemedicine application and verified that I am speaking with the correct person using two identifiers.  Patient Location: Home  Provider Location: Home Office  I discussed the limitations of evaluation and management by telemedicine. The patient expressed understanding and agreed to proceed.  Subjective:   Roy Richards is a 62 y.o. male who presents for an Initial Medicare Annual Wellness Visit.  Review of Systems      Cardiac Risk Factors include: advanced age (>50men, >67 women);hypertension;diabetes mellitus;male gender;sedentary lifestyle     Objective:    Today's Vitals   01/05/23 1418  Weight: 130 lb (59 kg)  Height:  (1.626 m)   Body mass index is 22.31 kg/m.     01/05/2023    2:32 PM 10/15/2022    2:43 AM 10/14/2022    2:31 PM 07/27/2022    9:23 AM 06/18/2019    4:21 PM  Advanced Directives  Does Patient Have a Medical Advance Directive? No  No No No  Would patient like information on creating a medical advance directive? No - Patient declined No - Patient declined   No - Patient declined    Current Medications (verified) Outpatient Encounter Medications as of 01/05/2023  Medication Sig   albuterol (VENTOLIN HFA) 108 (90 Base) MCG/ACT inhaler Inhale 1-2 puffs into the lungs every 6 (six) hours as needed for wheezing or shortness of breath.   amLODipine (NORVASC) 10 MG tablet TAKE ONE TABLET BY MOUTH DAILY FOR BLOOD PRESSURE   aspirin 81 MG chewable tablet Chew 81 mg by mouth daily.   atorvastatin (LIPITOR) 40 MG tablet TAKE 1 TABLET BY MOUTH DAILY FOR CHOLESTEROL   Cholecalciferol (VITAMIN D) 50 MCG (2000 UT) tablet Take 2,000 Units by mouth daily.   citalopram (CELEXA) 40 MG tablet TAKE 1 TABLET BY MOUTH DAILY FOR ANXIETY   fluticasone-salmeterol (ADVAIR DISKUS) 250-50 MCG/ACT AEPB Inhale 1 puff into the lungs in the morning and at bedtime.   lisinopril (ZESTRIL) 20 MG tablet TAKE 1 TABLET BY MOUTH  DAILY   metFORMIN (GLUCOPHAGE) 500 MG tablet TAKE 1 TABLET BY MOUTH TWICE A DAY WITH A MEAL FOR DIABETES   Multiple Vitamins-Minerals (MULTIVITAMIN ADULT PO) Take by mouth.   sildenafil (VIAGRA) 100 MG tablet Take 1/2 to 1 tablet by mouth 30 min prior to sexual activity   No facility-administered encounter medications on file as of 01/05/2023.    Allergies (verified) Patient has no known allergies.   History: Past Medical History:  Diagnosis Date   Acute colitis 06/26/2019   Diabetes mellitus without complication    Hyperlipidemia    Hypertension    Hypoxia 05/07/2020   Past Surgical History:  Procedure Laterality Date   HERNIA REPAIR     Family History  Problem Relation Age of Onset   COPD Mother    Heart disease Father    Hyperlipidemia Father    Hypertension Father    Social History   Socioeconomic History   Marital status: Divorced    Spouse name: Not on file   Number of children: Not on file   Years of education: Not on file   Highest education level: Not on file  Occupational History   Not on file  Tobacco Use   Smoking status: Some Days    Packs/day: 0.50    Years: 25.00    Additional pack years: 0.00    Total pack years: 12.50    Types: Cigarettes  Start date: 12/15/2022   Smokeless tobacco: Never  Vaping Use   Vaping Use: Never used  Substance and Sexual Activity   Alcohol use: Yes    Comment: occasionally   Drug use: Never   Sexual activity: Yes  Other Topics Concern   Not on file  Social History Narrative   Not on file   Social Determinants of Health   Financial Resource Strain: Low Risk  (01/05/2023)   Overall Financial Resource Strain (CARDIA)    Difficulty of Paying Living Expenses: Not hard at all  Food Insecurity: No Food Insecurity (01/05/2023)   Hunger Vital Sign    Worried About Running Out of Food in the Last Year: Never true    Ran Out of Food in the Last Year: Never true  Transportation Needs: No Transportation Needs  (01/05/2023)   PRAPARE - Administrator, Civil Service (Medical): No    Lack of Transportation (Non-Medical): No  Physical Activity: Inactive (01/05/2023)   Exercise Vital Sign    Days of Exercise per Week: 0 days    Minutes of Exercise per Session: 0 min  Stress: No Stress Concern Present (01/05/2023)   [employer] of Occupational Health - Occupational Stress Questionnaire    Feeling of Stress : Not at all  Social Connections: Socially Isolated (01/05/2023)   Social Connection and Isolation Panel [NHANES]    Frequency of Communication with Friends and Family: More than three times a week    Frequency of Social Gatherings with Friends and Family: More than three times a week    Attends Religious Services: Never    Database administrator or Organizations: No    Attends Engineer, structural: Never    Marital Status: Divorced    Tobacco Counseling Ready to quit: Yes Counseling given: No   Clinical Intake:  Pre-visit preparation completed: Yes  Pain : No/denies pain     Nutritional Risks: None Diabetes: Yes CBG done?: No Did pt. bring in CBG monitor from home?: No  How often do you need to have someone help you when you read instructions, pamphlets, or other written materials from your doctor or pharmacy?: 1 - Never  Diabetic?Nutrition Risk Assessment:  Has the patient had any N/V/D within the last 2 months?  No  Does the patient have any non-healing wounds?  No  Has the patient had any unintentional weight loss or weight gain?  No   Diabetes:  Is the patient diabetic?  Yes  If diabetic, was a CBG obtained today?  No  Did the patient bring in their glucometer from home?  No  How often do you monitor your CBG's? Pt doe not monitor.   Financial Strains and Diabetes Management:  Are you having any financial strains with the device, your supplies or your medication? No .  Does the patient want to be seen by Chronic Care Management for management  of their diabetes?  No  Would the patient like to be referred to a Nutritionist or for Diabetic Management?  No   Diabetic Exams:  Diabetic Eye Exam: Completed 02/17/2021 [facility]  Pt will call for appointment. Diabetic Foot Exam: Completed 06/02/2022 PCP    Interpreter Needed?: No  Information entered by :: C.Rorey Bisson lpn   Activities of Daily Living    01/05/2023    2:32 PM 10/15/2022    2:00 AM  In your present state of health, do you have any difficulty performing the following activities:  Hearing? 0 0  Vision? 0 0  Difficulty concentrating or making decisions? 1 0  Comment occasionally forgets   Walking or climbing stairs? 0 0  Dressing or bathing? 0 0  Doing errands, shopping? 0 0  Preparing Food and eating ? N   Using the Toilet? N   In the past six months, have you accidently leaked urine? N   Do you have problems with loss of bowel control? N   Managing your Medications? N   Managing your Finances? N   Housekeeping or managing your Housekeeping? N     Patient Care Team: [name], NP as PCP - General (Internal Medicine) Debbe Odea, MD as PCP - Cardiology (Cardiology) [facility], [facility])  Indicate any recent Medical Services you may have received from other than [facility] providers in the past year (date may be approximate).     Assessment:   This is a routine wellness examination for Yasmin.  Hearing/Vision screen Hearing Screening - Comments:: No aids Vision Screening - Comments:: Readers - [facility]  Dietary issues and exercise activities discussed: Current Exercise Habits: The patient does not participate in regular exercise at present, Exercise limited by: None identified   Goals Addressed             This Visit's Progress    Patient Stated       No new Goals.       Depression Screen    01/05/2023    2:31 PM 11/30/2022    9:05 AM 10/22/2022   12:19 PM 03/03/2021    7:28 AM 07/31/2019    7:54 AM  PHQ 2/9  Scores  PHQ - 2 Score 0 0 0 0 2  PHQ- 9 Score 0 0  0 11    Fall Risk    01/05/2023    2:24 PM 11/30/2022    9:05 AM 10/22/2022   12:19 PM  Fall Risk   Falls in the past year? 0 0 0  Number falls in past yr: 0 0 0  Injury with Fall? 0 0 0  Risk for fall due to : No Fall Risks No Fall Risks No Fall Risks  Follow up Falls prevention discussed;Falls evaluation completed Falls evaluation completed Falls evaluation completed    FALL RISK PREVENTION PERTAINING TO THE HOME:  Any stairs in or around the home? Yes  If so, are there any without handrails? No  Home free of loose throw rugs in walkways, pet beds, electrical cords, etc? Yes  Adequate lighting in your home to reduce risk of falls? Yes   ASSISTIVE DEVICES UTILIZED TO PREVENT FALLS:  Life alert? No  Use of a cane, walker or w/c? No  Grab bars in the bathroom? No  Shower chair or bench in shower? No  Elevated toilet seat or a handicapped toilet? No   Cognitive Function:        01/05/2023    2:33 PM  6CIT Screen  What Year? 0 points  What month? 0 points  What time? 0 points  Count back from 20 0 points  Months in reverse 0 points  Repeat phrase 0 points  Total Score 0 points    Immunizations Immunization History  Administered Date(s) Administered   Influenza,inj,Quad PF,6+ Mos 10/10/2020, 05/21/2021   PFIZER Comirnaty(Gray Top)Covid-19 Tri-Sucrose Vaccine 12/12/2020   PFIZER(Purple Top)SARS-COV-2 Vaccination 06/06/2020, 06/27/2020   Pneumococcal Polysaccharide-23 09/19/2020   Tdap 05/21/2021    TDAP status: Up to date  Flu Vaccine status: Declined, Education has been provided  regarding the importance of this vaccine but patient still declined. Advised may receive this vaccine at local pharmacy or Health Dept. Aware to provide a copy of the vaccination record if obtained from local pharmacy or Health Dept. Verbalized acceptance and understanding.  Pneumococcal vaccine status: Up to date  Covid-19 vaccine  status: Information provided on how to obtain vaccines.   Qualifies for Shingles Vaccine? Yes   Zostavax completed No   Shingrix Completed?: Yes  Screening Tests Health Maintenance  Topic Date Due   COVID-19 Vaccine (4 - 2023-24 season) 01/15/2023 (Originally 05/14/2022)   Zoster Vaccines- Shingrix (1 of 2) 01/20/2023 (Originally 06/10/2009)   OPHTHALMOLOGY EXAM  12/30/2023 (Originally 02/17/2022)   INFLUENZA VACCINE  04/14/2023   HEMOGLOBIN A1C  06/02/2023   Diabetic kidney evaluation - Urine ACR  06/03/2023   FOOT EXAM  06/03/2023   Diabetic kidney evaluation - eGFR measurement  11/30/2023   Medicare Annual Wellness (AWV)  01/05/2024   DTaP/Tdap/Td (2 - Td or Tdap) 05/22/2031   COLONOSCOPY (Pts 45-67yrs Insurance coverage will need to be confirmed)  06/03/2031   Hepatitis C Screening  Completed   HIV Screening  Completed   HPV VACCINES  Aged Out    Health Maintenance  There are no preventive care reminders to display for this patient.   Colorectal cancer screening: Type of screening: Colonoscopy. Completed 06/02/21. Repeat every 10 years  Lung Cancer Screening: (Low Dose CT Chest recommended if Age 55-80 years, 30 pack-year currently smoking OR have quit w/in 15years.) does not qualify.   Lung Cancer Screening Referral: no  Additional Screening:  Hepatitis C Screening: does qualify; Completed 04/30/2020  Vision Screening: Recommended annual ophthalmology exams for early detection of glaucoma and other disorders of the eye. Is the patient up to date with their annual eye exam?  No , Pt will call for appointment. Who is the provider or what is the name of the office in which the patient attends annual eye exams? [facility] If pt is not established with a provider, would they like to be referred to a provider to establish care? No .   Dental Screening: Recommended annual dental exams for proper oral hygiene  Community Resource Referral / Chronic Care Management: CRR  required this visit?  No   CCM required this visit?  No      Plan:     I have personally reviewed and noted the following in the patient's chart:   Medical and social history Use of alcohol, tobacco or illicit drugs  Current medications and supplements including opioid prescriptions. Patient is not currently taking opioid prescriptions. Functional ability and status Nutritional status Physical activity Advanced directives List of other physicians Hospitalizations, surgeries, and ER visits in previous 12 months Vitals Screenings to include cognitive, depression, and falls Referrals and appointments  In addition, I have reviewed and discussed with patient certain preventive protocols, quality metrics, and best practice recommendations. A written personalized care plan for preventive services as well as general preventive health recommendations were provided to patient.     Maryan Puls, LPN   1/61/0960   Nurse Notes: none

## 2023-02-06 ENCOUNTER — Other Ambulatory Visit: Payer: Self-pay | Admitting: Primary Care

## 2023-02-06 DIAGNOSIS — E785 Hyperlipidemia, unspecified: Secondary | ICD-10-CM

## 2023-02-06 DIAGNOSIS — I10 Essential (primary) hypertension: Secondary | ICD-10-CM

## 2023-02-08 ENCOUNTER — Other Ambulatory Visit: Payer: Self-pay | Admitting: Primary Care

## 2023-02-08 DIAGNOSIS — E1159 Type 2 diabetes mellitus with other circulatory complications: Secondary | ICD-10-CM

## 2023-02-25 ENCOUNTER — Other Ambulatory Visit: Payer: Medicare HMO

## 2023-02-25 ENCOUNTER — Ambulatory Visit (INDEPENDENT_AMBULATORY_CARE_PROVIDER_SITE_OTHER): Payer: Medicare HMO

## 2023-02-25 ENCOUNTER — Ambulatory Visit
Admission: EM | Admit: 2023-02-25 | Discharge: 2023-02-25 | Disposition: A | Discharge status: Home / Self Care | Payer: Medicare HMO | Attending: Emergency Medicine | Admitting: Emergency Medicine

## 2023-02-25 DIAGNOSIS — M79641 Pain in right hand: Secondary | ICD-10-CM

## 2023-02-25 DIAGNOSIS — W540XXA Bitten by dog, initial encounter: Secondary | ICD-10-CM | POA: Diagnosis not present

## 2023-02-25 DIAGNOSIS — M79643 Pain in unspecified hand: Secondary | ICD-10-CM | POA: Insufficient documentation

## 2023-02-25 DIAGNOSIS — L03113 Cellulitis of right upper limb: Secondary | ICD-10-CM | POA: Diagnosis not present

## 2023-02-25 DIAGNOSIS — S61451A Open bite of right hand, initial encounter: Secondary | ICD-10-CM | POA: Diagnosis not present

## 2023-02-25 MED ORDER — AMOXICILLIN-POT CLAVULANATE 875-125 MG PO TABS: 1.0000 | ORAL_TABLET | Freq: Two times a day (BID) | ORAL | 0 refills | Status: DC
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
## 2023-02-25 NOTE — Discharge Instructions (Addendum)
Take the Augmentin as directed.  Follow up with your primary care provider on Monday.  Go to the emergency department if your have worsening symptoms.
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
## 2023-03-01 ENCOUNTER — Encounter: Payer: Self-pay | Admitting: Primary Care

## 2023-03-01 ENCOUNTER — Ambulatory Visit (INDEPENDENT_AMBULATORY_CARE_PROVIDER_SITE_OTHER): Payer: Medicare HMO | Admitting: Primary Care

## 2023-03-01 VITALS — BP 128/84 | HR 65 | Temp 98.2°F | Ht 64.0 in | Wt 137.0 lb

## 2023-03-01 DIAGNOSIS — M79641 Pain in right hand: Secondary | ICD-10-CM | POA: Diagnosis not present

## 2023-03-01 DIAGNOSIS — J449 Chronic obstructive pulmonary disease, unspecified: Secondary | ICD-10-CM

## 2023-03-01 MED ORDER — ALBUTEROL SULFATE HFA 108 (90 BASE) MCG/ACT IN AERS: 1.0000 | INHALATION_SPRAY | Freq: Four times a day (QID) | RESPIRATORY_TRACT | 0 refills | Status: DC | PRN

## 2023-03-01 NOTE — Patient Instructions (Signed)
Continue your antibiotic medications twice daily until complete.  It was a pleasure to see you today!
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
## 2023-03-01 NOTE — Assessment & Plan Note (Signed)
Improving.   Reviewed urgent care notes and x-ray. Wounds appear to be healing as expected.  Continue Augmentin twice daily until complete.

## 2023-03-13 ENCOUNTER — Other Ambulatory Visit: Payer: Self-pay | Admitting: Acute Care

## 2023-03-13 DIAGNOSIS — Z122 Encounter for screening for malignant neoplasm of respiratory organs: Secondary | ICD-10-CM

## 2023-03-13 DIAGNOSIS — Z87891 Personal history of nicotine dependence: Secondary | ICD-10-CM

## 2023-03-13 DIAGNOSIS — F17210 Nicotine dependence, cigarettes, uncomplicated: Secondary | ICD-10-CM

## 2023-05-07 ENCOUNTER — Other Ambulatory Visit: Payer: Self-pay | Admitting: Primary Care

## 2023-05-07 DIAGNOSIS — E1159 Type 2 diabetes mellitus with other circulatory complications: Secondary | ICD-10-CM

## 2023-05-07 NOTE — Telephone Encounter (Signed)
 Patient is due for diabetes follow up in September, this will be required prior to any further refills.  Please schedule, thank you!

## 2023-05-09 NOTE — Telephone Encounter (Signed)
Spoke to pt, scheduled f/u for 05/31/23

## 2023-05-20 DIAGNOSIS — R059 Cough, unspecified: Secondary | ICD-10-CM | POA: Diagnosis not present

## 2023-05-20 DIAGNOSIS — J441 Chronic obstructive pulmonary disease with (acute) exacerbation: Secondary | ICD-10-CM | POA: Diagnosis not present

## 2023-05-20 DIAGNOSIS — F32A Depression, unspecified: Secondary | ICD-10-CM | POA: Insufficient documentation

## 2023-05-20 DIAGNOSIS — R197 Diarrhea, unspecified: Secondary | ICD-10-CM | POA: Diagnosis not present

## 2023-05-23 ENCOUNTER — Ambulatory Visit
Admission: RE | Admit: 2023-05-23 | Discharge: 2023-05-23 | Disposition: A | Discharge status: Home / Self Care | Payer: Medicare HMO | Source: Ambulatory Visit | Attending: Internal Medicine | Admitting: Internal Medicine

## 2023-05-23 DIAGNOSIS — Z122 Encounter for screening for malignant neoplasm of respiratory organs: Secondary | ICD-10-CM | POA: Diagnosis not present

## 2023-05-23 DIAGNOSIS — F17210 Nicotine dependence, cigarettes, uncomplicated: Secondary | ICD-10-CM | POA: Diagnosis not present

## 2023-05-23 DIAGNOSIS — Z87891 Personal history of nicotine dependence: Secondary | ICD-10-CM | POA: Insufficient documentation

## 2023-05-31 ENCOUNTER — Encounter: Payer: Self-pay | Admitting: Primary Care

## 2023-05-31 ENCOUNTER — Other Ambulatory Visit: Payer: Self-pay | Admitting: Acute Care

## 2023-05-31 ENCOUNTER — Encounter: Payer: Self-pay | Admitting: *Deleted

## 2023-05-31 ENCOUNTER — Ambulatory Visit: Payer: Medicare HMO | Admitting: Dermatology

## 2023-05-31 ENCOUNTER — Ambulatory Visit (INDEPENDENT_AMBULATORY_CARE_PROVIDER_SITE_OTHER): Payer: Medicare HMO | Admitting: Primary Care

## 2023-05-31 VITALS — BP 124/68 | HR 70 | Temp 97.1°F | Ht 64.0 in | Wt 133.0 lb

## 2023-05-31 DIAGNOSIS — R0609 Other forms of dyspnea: Secondary | ICD-10-CM | POA: Diagnosis not present

## 2023-05-31 DIAGNOSIS — Z122 Encounter for screening for malignant neoplasm of respiratory organs: Secondary | ICD-10-CM

## 2023-05-31 DIAGNOSIS — I700 Atherosclerosis of aorta: Secondary | ICD-10-CM | POA: Diagnosis not present

## 2023-05-31 DIAGNOSIS — I2510 Atherosclerotic heart disease of native coronary artery without angina pectoris: Secondary | ICD-10-CM | POA: Insufficient documentation

## 2023-05-31 DIAGNOSIS — R197 Diarrhea, unspecified: Secondary | ICD-10-CM

## 2023-05-31 DIAGNOSIS — Z7984 Long term (current) use of oral hypoglycemic drugs: Secondary | ICD-10-CM

## 2023-05-31 DIAGNOSIS — R1032 Left lower quadrant pain: Secondary | ICD-10-CM

## 2023-05-31 DIAGNOSIS — I10 Essential (primary) hypertension: Secondary | ICD-10-CM | POA: Diagnosis not present

## 2023-05-31 DIAGNOSIS — E1159 Type 2 diabetes mellitus with other circulatory complications: Secondary | ICD-10-CM | POA: Diagnosis not present

## 2023-05-31 DIAGNOSIS — J449 Chronic obstructive pulmonary disease, unspecified: Secondary | ICD-10-CM

## 2023-05-31 DIAGNOSIS — Z87891 Personal history of nicotine dependence: Secondary | ICD-10-CM

## 2023-05-31 LAB — CBC WITH DIFFERENTIAL/PLATELET
Basophils Absolute: 0.1 10*3/uL (ref 0.0–0.1)
Basophils Relative: 0.4 % (ref 0.0–3.0)
Eosinophils Absolute: 0.3 10*3/uL (ref 0.0–0.7)
Eosinophils Relative: 2.2 % (ref 0.0–5.0)
HCT: 45.3 % (ref 39.0–52.0)
Hemoglobin: 14.5 g/dL (ref 13.0–17.0)
Lymphocytes Relative: 14.3 % (ref 12.0–46.0)
Lymphs Abs: 1.8 10*3/uL (ref 0.7–4.0)
MCHC: 32.1 g/dL (ref 30.0–36.0)
MCV: 93.6 fl (ref 78.0–100.0)
Monocytes Absolute: 1.1 10*3/uL — ABNORMAL HIGH (ref 0.1–1.0)
Monocytes Relative: 8.7 % (ref 3.0–12.0)
Neutro Abs: 9.2 10*3/uL — ABNORMAL HIGH (ref 1.4–7.7)
Neutrophils Relative %: 74.4 % (ref 43.0–77.0)
Platelets: 222 10*3/uL (ref 150.0–400.0)
RBC: 4.84 Mil/uL (ref 4.22–5.81)
RDW: 14.7 % (ref 11.5–15.5)
WBC: 12.4 10*3/uL — ABNORMAL HIGH (ref 4.0–10.5)

## 2023-05-31 LAB — BASIC METABOLIC PANEL
BUN: 12 mg/dL (ref 6–23)
CO2: 35 meq/L — ABNORMAL HIGH (ref 19–32)
Calcium: 8.9 mg/dL (ref 8.4–10.5)
Chloride: 98 meq/L (ref 96–112)
Creatinine, Ser: 0.72 mg/dL (ref 0.40–1.50)
GFR: 96.92 mL/min (ref >60.00)
Glucose, Bld: 90 mg/dL (ref 70–99)
Potassium: 4.2 meq/L (ref 3.5–5.1)
Sodium: 139 meq/L (ref 135–145)

## 2023-05-31 LAB — MICROALBUMIN / CREATININE URINE RATIO
Creatinine,U: 92.4 mg/dL
Microalb Creat Ratio: 1.9 mg/g (ref 0.0–30.0)
Microalb, Ur: 1.8 mg/dL (ref 0.0–1.9)

## 2023-05-31 LAB — POCT GLYCOSYLATED HEMOGLOBIN (HGB A1C): Hemoglobin A1C: 6 % — AB (ref 4.0–5.6)

## 2023-05-31 MED ORDER — LOSARTAN POTASSIUM 50 MG PO TABS
50.0000 mg | ORAL_TABLET | Freq: Every day | ORAL | 1 refills | Status: DC
Start: 2023-05-31 — End: 2023-11-24

## 2023-05-31 MED ORDER — METFORMIN HCL ER 500 MG PO TB24
500.0000 mg | ORAL_TABLET | Freq: Every day | ORAL | 1 refills | Status: AC
Start: 2023-05-31 — End: ?

## 2023-05-31 MED ORDER — PREDNISONE 20 MG PO TABS: ORAL_TABLET | ORAL | 0 refills | Status: DC

## 2023-05-31 NOTE — Patient Instructions (Addendum)
Stop by the lab prior to leaving today. I will notify you of your results once received.   Return the stool tests if your diarrhea returns.  We will be in touch later today regarding your CT scan results.   We changed your metformin to the ER version. Take 1 tablet by mouth once daily for diabetes.  Stop taking lisinopril for blood pressure. Start losartan 50 mg daily for blood pressure.   It was a pleasure to see you today!

## 2023-05-31 NOTE — Progress Notes (Signed)
Subjective:    Patient ID: Roy Richards, male    DOB: 1960-10-12, 62 y.o.   MRN: [id number]  HPI  Roy Richards is a very pleasant 62 y.o. male with a history of COPD, type 2 diabetes, tobacco use, hyperlipidemia, hypertension, aortic atherosclerosis who presents today for follow-up of diabetes.  He would also like to discuss several other concerns.  1) Type 2 Diabetes:  Current medications include: Metformin 500 mg twice daily  He is checking his blood glucose 0 times daily.  Last A1C: 6.2 in March, 6.0 today Last Eye Exam: UTD Last Foot Exam: Due Pneumonia Vaccination: 2022 Urine Microalbumin: Due Statin: atorvastatin   Dietary changes since last visit: No changes.    Exercise: Increased activity  2) COPD/Chronic Cough/Exertional Dyspnea: Currently managed on Advair Diskus 250-50 mcg inhaler and albuterol inhaler. He's using his albuterol inhaler five times weekly for the last 2-3 weeks.   He has a chronic cough but about three weeks ago he began to notice increased cough with chest tightness and exertional dyspnea. He feels exertional dyspnea with walking across a parking lot, carrying groceries, doing laundry. His cough is productive with clear sputum.   Evaluated by urgent care on 05/20/23, treated with prednisone and Doxycycline for which he completed. He underwent chest xray, he isn't sure of the results. He felt no improvement with treatment. He tested negative for Covid and influenza.   He underwent CT chest for lung cancer screening program on 05/23/23 which reveal stable pulmonary nodules, mild centrilobular and paraseptal emphysema, urinary catheter cirrhosis, left main and two-vessel CAD.  3) Acute Diarrhea/LLQ Abdominal Pain: Acute for the last 6 weeks, since early August 2024. He was experiencing 2-3 episodes of liquidly diarrhea everyday. He also noticed intermittent left lower quadrant abdominal pain that began around the same time as his diarrhea. Symptoms of  diarrhea and abdominal pain occur within 30 minutes of eating a regular meal.   His stools have become more formed over the last 1 week, but his abdominal pain was about the same.   He denies nausea, vomiting, bloody stools. He was treated for a dog bite at Urgent Care, treated with Augmentin course in June. Also treated with Doxycycline at Urgent Care 2 weeks ago.   He is managed on metformin 500 mg BID. He denies daily diarrhea.   He underwent colonoscopy in 2022 which revealed sigmoid and descending colonic diverticulosis.   BP Readings from Last 3 Encounters:  05/31/23 124/68  03/01/23 128/84  02/25/23 104/60     Review of Systems  Constitutional:  Negative for fever.  Respiratory:  Positive for cough, chest tightness and shortness of breath.   Gastrointestinal:  Positive for diarrhea.         Past Medical History:  Diagnosis Date   Acute colitis 06/26/2019   Diabetes mellitus without complication (HCC)    Hyperlipidemia    Hypertension    Hypoxia 05/07/2020    Social History   Socioeconomic History   Marital status: Divorced    Spouse name: Not on file   Number of children: Not on file   Years of education: Not on file   Highest education level: Not on file  Occupational History   Not on file  Tobacco Use   Smoking status: Some Days    Current packs/day: 0.50    Average packs/day: 0.5 packs/day for 25.0 years (12.5 ttl pk-yrs)    Types: Cigarettes    Start date: 12/15/2022   Smokeless  tobacco: Never  Vaping Use   Vaping status: Never Used  Substance and Sexual Activity   Alcohol use: Yes    Comment: occasionally   Drug use: Never   Sexual activity: Yes  Other Topics Concern   Not on file  Social History Narrative   Not on file   Social Determinants of Health   Financial Resource Strain: Low Risk  (01/05/2023)   Overall Financial Resource Strain (CARDIA)    Difficulty of Paying Living Expenses: Not hard at all  Food Insecurity: No Food Insecurity  (01/05/2023)   Hunger Vital Sign    Worried About Running Out of Food in the Last Year: Never true    Ran Out of Food in the Last Year: Never true  Transportation Needs: No Transportation Needs (01/05/2023)   PRAPARE - Administrator, Civil Service (Medical): No    Lack of Transportation (Non-Medical): No  Physical Activity: Inactive (01/05/2023)   Exercise Vital Sign    Days of Exercise per Week: 0 days    Minutes of Exercise per Session: 0 min  Stress: No Stress Concern Present (01/05/2023)   [employer] of Occupational Health - Occupational Stress Questionnaire    Feeling of Stress : Not at all  Social Connections: Socially Isolated (01/05/2023)   Social Connection and Isolation Panel [NHANES]    Frequency of Communication with Friends and Family: More than three times a week    Frequency of Social Gatherings with Friends and Family: More than three times a week    Attends Religious Services: Never    Database administrator or Organizations: No    Attends Banker Meetings: Never    Marital Status: Divorced  Catering manager Violence: Not At Risk (01/05/2023)   Humiliation, Afraid, Rape, and Kick questionnaire    Fear of Current or Ex-Partner: No    Emotionally Abused: No    Physically Abused: No    Sexually Abused: No    Past Surgical History:  Procedure Laterality Date   HERNIA REPAIR      Family History  Problem Relation Age of Onset   COPD Mother    Heart disease Father    Hyperlipidemia Father    Hypertension Father     No Known Allergies  Current Outpatient Medications on File Prior to Visit  Medication Sig Dispense Refill   albuterol (VENTOLIN HFA) 108 (90 Base) MCG/ACT inhaler Inhale 1-2 puffs into the lungs every 6 (six) hours as needed for wheezing or shortness of breath. 18 g 0   amLODipine (NORVASC) 10 MG tablet TAKE ONE TABLET BY MOUTH DAILY FOR BLOOD PRESSURE 90 tablet 3   aspirin 81 MG chewable tablet Chew 81 mg by mouth  daily.     atorvastatin (LIPITOR) 40 MG tablet TAKE 1 TABLET BY MOUTH DAILY FOR CHOLESTEROL 90 tablet 2   Cholecalciferol (VITAMIN D) 50 MCG (2000 UT) tablet Take 2,000 Units by mouth daily.     citalopram (CELEXA) 40 MG tablet TAKE 1 TABLET BY MOUTH DAILY FOR ANXIETY 90 tablet 2   fluticasone-salmeterol (ADVAIR DISKUS) 250-50 MCG/ACT AEPB Inhale 1 puff into the lungs in the morning and at bedtime. 60 each 11   Multiple Vitamins-Minerals (MULTIVITAMIN ADULT PO) Take by mouth.     sildenafil (VIAGRA) 100 MG tablet Take 1/2 to 1 tablet by mouth 30 min prior to sexual activity 30 tablet 0   amoxicillin-clavulanate (AUGMENTIN) 875-125 MG tablet Take 1 tablet by mouth every 12 (twelve) hours. (Patient  not taking: Reported on 05/31/2023) 14 tablet 0   No current facility-administered medications on file prior to visit.    BP 124/68   Pulse 70   Temp (!) 97.1 F (36.2 C) (Temporal)   Ht 5\' 4"  (1.626 m)   Wt 133 lb (60.3 kg)   SpO2 95%   BMI 22.83 kg/m  Objective:   Physical Exam Constitutional:      General: He is not in acute distress. Cardiovascular:     Rate and Rhythm: Normal rate and regular rhythm.  Pulmonary:     Breath sounds: Examination of the right-upper field reveals wheezing and rhonchi. Examination of the left-upper field reveals wheezing and rhonchi. Examination of the right-lower field reveals wheezing and rhonchi. Examination of the left-lower field reveals wheezing and rhonchi. Wheezing and rhonchi present.  Abdominal:     General: Bowel sounds are normal.     Palpations: Abdomen is soft.     Tenderness: There is no abdominal tenderness.  Neurological:     Mental Status: He is alert.           Assessment & Plan:  Type 2 diabetes mellitus with other circulatory complication, without long-term current use of insulin (HCC) Assessment & Plan: Controlled.   Change to metformin ER 500 mg, given recent diarrhea symptoms.    Foot exam completed.   Follow-up in 6  months.   Orders: -     POCT glycosylated hemoglobin (Hb A1C) -     Microalbumin / creatinine urine ratio -     metFORMIN HCl ER; Take 1 tablet (500 mg total) by mouth daily with breakfast. for diabetes.  Dispense: 90 tablet; Refill: 1  Acute diarrhea Assessment & Plan: Improving.   Differentials include side effects from antibiotic use, metformin, C. Diff, diverticulitis.   CBC with differential, BMP, stool studies pending.  CT abd/pelvis ordered for ongoing abdominal pain, especially with findings of diverticulosis to the sigmoid and descending colon.   Orders: -     CBC with Differential/Platelet -     Basic metabolic panel -     Giardia antigen; Future -     Gastrointestinal Pathogen Pnl RT, PCR; Future -     C. difficile GDH and Toxin A/B; Future  Essential hypertension Assessment & Plan: Controlled.  Stop Lisinopril 20 mg as it could be contributing to cough.   Start losartan 50 mg daily.   Continue to monitor. He will update.  Orders: -     Losartan Potassium; Take 1 tablet (50 mg total) by mouth daily. for blood pressure.  Dispense: 90 tablet; Refill: 1  Aortic atherosclerosis (HCC) -     Ambulatory referral to Cardiology  Coronary artery disease involving native coronary artery of native heart without angina pectoris Assessment & Plan: CAD noted on CT chest.   Given symptoms of exertional dyspnea and chest tightness, will refer to Cardiology for evaluation. Referral placed.   Continue atorvastatin 40 mg for lipid control. Continue diet control and diabetes control.   Orders: -     Ambulatory referral to Cardiology  Chronic obstructive pulmonary disease, unspecified COPD type (HCC) Assessment & Plan: Suspect acute exacerbation especially given lack of acute findings on recent CT chest.  Exam today stable with SpO2 95%. No acute distress.   Continue Advair 250-50 mcg twice daily.   Treat potential exacerbation with prednisone course. Start prednisone  40 mg daily for 5 days.  He will update in a few days if no improvement.  Orders: -     predniSONE; Take 2 tablets by mouth once daily for 5 days.  Dispense: 10 tablet; Refill: 0  Left lower quadrant abdominal pain -     CT ABDOMEN PELVIS W CONTRAST; Future  Exertional dyspnea -     predniSONE; Take 2 tablets by mouth once daily for 5 days.  Dispense: 10 tablet; Refill: 0        [name], NP

## 2023-05-31 NOTE — Assessment & Plan Note (Addendum)
Suspect acute exacerbation especially given lack of acute findings on recent CT chest.  Exam today stable with SpO2 95%. No acute distress.   Continue Advair 250-50 mcg twice daily.   Treat potential exacerbation with prednisone course. Start prednisone 40 mg daily for 5 days.  He will update in a few days if no improvement.   I evaluated patient, was consulted regarding treatment, and agree with assessment and plan per Tenna Delaine, RN, DNP student.   Mayra Reel, NP-C

## 2023-05-31 NOTE — Assessment & Plan Note (Addendum)
Controlled.   Change to metformin ER 500 mg, given recent diarrhea symptoms.    Foot exam completed.   Follow-up in 6 months.   I evaluated patient, was consulted regarding treatment, and agree with assessment and plan per Tenna Delaine, RN, DNP student.   Mayra Reel, NP-C

## 2023-05-31 NOTE — Progress Notes (Signed)
Established Patient Office Visit  Subjective   Patient ID: Roy Richards, male    DOB: 1961/01/30  Age: 62 y.o. MRN: [id number]  Chief Complaint  Patient presents with   Medical Management of Chronic Issues   Cough    Cough, chest tightness, SOB x 4-5 weeks  Went to UC was tested for covid, flu- both neg. Was treated with doxy and prednisone, he finished this course and is not any better   Diarrhea    Since the first of August patient has had diarrhea everyday after he eats something. In the last week he has not had any diarrhea but very soft stool.     Cough Associated symptoms include shortness of breath and wheezing. Pertinent negatives include no chills, fever or headaches.  Diarrhea  Associated symptoms include abdominal pain and coughing. Pertinent negatives include no chills, fever, headaches or vomiting.    Roy Richards is a very pleasant 62 y.o male with a past medical history of COPD, tobacco use, hypertension, renal artery stenosis, hyperlipidemia who presents today for type 2 diabetes follow-up.   Current medications include: Metformin 500 mg BID  He is not checking his blood glucose.  Last A1C: 6.0 today, previously was 6.2 on 11/30/22 Last Eye Exam: UTD Last Foot Exam: due today Pneumonia Vaccination: UTD Urine Microalbumin: due today Statin: Atorvastatin 40 mg daily  Flu shot: declines  Dietary changes since last visit: He is trying to be mindful of sugar intake.   Exercise: He completes house chores and yard work.    Additionally, he would like to discuss his cough and diarrhea.  1) Type 2 DM: Currently managed on Metformin 500 mg BID. A1c 6.0 today.   2) COPD/chronic cough: Currently managed on Advair Diskus and Albuterol inhaler. He does use tobacco and has a history of chronic SOB. Approximately 3 weeks ago, he had contact with a sick individual and subsequently developed increased SOB and persistent congested cough. He presented to urgent care on 05/20/23 for  increased SOB and cough. He was prescribed a course of doxycycline and prednisone, which he completed. He does not feel that his symptoms improved after this treatment.   He has a congested cough and has increased clear sputum production. He has tried OTC "cough medicine" which helped. He is taking his Advair Diskus daily as prescribed. He has needed to use his Albuterol inhaler 5 times per week since becoming sick. He has been sleeping sitting up and this helps the SOB. He also has intermittent chest tightness in the center of his chest with no pain and no radiation along with exertional dyspnea. He takes Lisinopril daily.   3) Diarrhea: He has had diarrhea for approximately 6 weeks. He took a course of Augmentin in June 2024 for dog bite to the hand and a course of Doxycycline in September 2024. He is on Metformin 500 mg BID. The diarrhea has improved from liquid stools to soft formed stool in the last week. The diarrhea is associated with persistent LLQ abdominal pain that worsens with eating. Pain is 2/10. On his last colonoscopy there is evidence of diverticulosis.  Review of Systems  Constitutional:  Negative for chills and fever.  Respiratory:  Positive for cough, sputum production, shortness of breath and wheezing.   Gastrointestinal:  Positive for abdominal pain and diarrhea. Negative for nausea and vomiting.  Neurological:  Negative for dizziness and headaches.    Objective:     BP 124/68   Pulse 70  Temp (!) 97.1 F (36.2 C) (Temporal)   Ht 5\' 4"  (1.626 m)   Wt 133 lb (60.3 kg)   SpO2 95%   BMI 22.83 kg/m  BP Readings from Last 3 Encounters:  05/31/23 124/68  03/01/23 128/84  02/25/23 104/60   Wt Readings from Last 3 Encounters:  05/31/23 133 lb (60.3 kg)  03/01/23 137 lb (62.1 kg)  01/05/23 130 lb (59 kg)      Physical Exam Cardiovascular:     Rate and Rhythm: Normal rate and regular rhythm.  Pulmonary:     Breath sounds: Wheezing present.  Abdominal:      General: Abdomen is flat. Bowel sounds are normal.     Palpations: Abdomen is soft.  Skin:    General: Skin is warm and dry.  Neurological:     General: No focal deficit present.     Mental Status: He is alert.     Results for orders placed or performed in visit on 05/31/23  POCT glycosylated hemoglobin (Hb A1C)  Result Value Ref Range   Hemoglobin A1C 6.0 (A) 4.0 - 5.6 %   HbA1c POC (<> result, manual entry)     HbA1c, POC (prediabetic range)     HbA1c, POC (controlled diabetic range)      The ASCVD Risk score (Arnett DK, et al., 2019) failed to calculate for the following reasons:   The valid total cholesterol range is 130 to 320 mg/dL    Assessment & Plan:   Problem List Items Addressed This Visit       Cardiovascular and Mediastinum   Aortic atherosclerosis (HCC)   Relevant Medications   losartan (COZAAR) 50 MG tablet   Essential hypertension    Controlled.  Stop Lisinopril 20 mg due to side effects.  Start losartan 50 mg daily.       Relevant Medications   losartan (COZAAR) 50 MG tablet     Digestive   Acute diarrhea    Improving.   CBC with differential, BMP, C. Difficile and stool studies pending.        Relevant Orders   CBC with Differential/Platelet   Basic metabolic panel   Giardia antigen   Gastrointestinal Pathogen Pnl RT, PCR   C. difficile GDH and Toxin A/B     Endocrine   Diabetes (HCC) - Primary    Controlled.   We changed the Metformin to the extended release version due to side effects.   Foot exam completed.       Relevant Medications   metFORMIN (GLUCOPHAGE-XR) 500 MG 24 hr tablet   losartan (COZAAR) 50 MG tablet   Other Relevant Orders   POCT glycosylated hemoglobin (Hb A1C) (Completed)   Microalbumin / creatinine urine ratio   Other Visit Diagnoses     Coronary artery disease involving native coronary artery of native heart without angina pectoris       Relevant Medications   losartan (COZAAR) 50 MG tablet        Roy Mccreedy, RN

## 2023-05-31 NOTE — Assessment & Plan Note (Addendum)
CAD noted on CT chest.   Given symptoms of exertional dyspnea and chest tightness, will refer to Cardiology for evaluation. Referral placed.   Continue atorvastatin 40 mg for lipid control. Continue diet control and diabetes control.   I evaluated patient, was consulted regarding treatment, and agree with assessment and plan per Tenna Delaine, RN, DNP student.   Mayra Reel, NP-C

## 2023-05-31 NOTE — Assessment & Plan Note (Addendum)
Controlled.  Stop Lisinopril 20 mg as it could be contributing to cough.   Start losartan 50 mg daily.   Continue to monitor. He will update.  I evaluated patient, was consulted regarding treatment, and agree with assessment and plan per Tenna Delaine, RN, DNP student.   Mayra Reel, NP-C

## 2023-05-31 NOTE — Assessment & Plan Note (Addendum)
Improving.   Differentials include side effects from antibiotic use, metformin, C. Diff, diverticulitis.   CBC with differential, BMP, stool studies pending.  CT abd/pelvis ordered for ongoing abdominal pain, especially with findings of diverticulosis to the sigmoid and descending colon.   I evaluated patient, was consulted regarding treatment, and agree with assessment and plan per Tenna Delaine, RN, DNP student.   Mayra Reel, NP-C

## 2023-06-02 ENCOUNTER — Other Ambulatory Visit: Payer: Self-pay | Admitting: Primary Care

## 2023-06-02 ENCOUNTER — Ambulatory Visit
Admission: RE | Admit: 2023-06-02 | Discharge: 2023-06-02 | Disposition: A | Discharge status: Home / Self Care | Payer: Medicare HMO | Source: Ambulatory Visit | Attending: Primary Care | Admitting: Primary Care

## 2023-06-02 DIAGNOSIS — J189 Pneumonia, unspecified organism: Secondary | ICD-10-CM

## 2023-06-02 DIAGNOSIS — R1032 Left lower quadrant pain: Secondary | ICD-10-CM | POA: Diagnosis not present

## 2023-06-02 DIAGNOSIS — K5730 Diverticulosis of large intestine without perforation or abscess without bleeding: Secondary | ICD-10-CM | POA: Diagnosis not present

## 2023-06-02 DIAGNOSIS — K8020 Calculus of gallbladder without cholecystitis without obstruction: Secondary | ICD-10-CM | POA: Diagnosis not present

## 2023-06-02 MED ORDER — AMOXICILLIN-POT CLAVULANATE 875-125 MG PO TABS
1.0000 | ORAL_TABLET | Freq: Two times a day (BID) | ORAL | 0 refills | Status: AC
Start: 2023-06-02 — End: ?

## 2023-06-02 MED ORDER — AZITHROMYCIN 250 MG PO TABS
ORAL_TABLET | ORAL | 0 refills | Status: AC
Start: 2023-06-02 — End: ?

## 2023-06-02 MED ORDER — IOHEXOL 300 MG/ML  SOLN
100.0000 mL | Freq: Once | INTRAMUSCULAR | Status: AC | PRN
  Administered 2023-06-02: 100 mL via INTRAVENOUS

## 2023-06-07 ENCOUNTER — Other Ambulatory Visit: Payer: Self-pay

## 2023-06-07 DIAGNOSIS — R197 Diarrhea, unspecified: Secondary | ICD-10-CM

## 2023-06-07 DIAGNOSIS — R1032 Left lower quadrant pain: Secondary | ICD-10-CM

## 2023-06-08 LAB — C. DIFFICILE GDH AND TOXIN A/B
GDH ANTIGEN: NOT DETECTED
MICRO NUMBER:: 15508843
SPECIMEN QUALITY:: ADEQUATE
TOXIN A AND B: NOT DETECTED

## 2023-06-09 ENCOUNTER — Telehealth: Payer: Self-pay | Admitting: Cardiology

## 2023-06-09 LAB — GASTROINTESTINAL PATHOGEN PNL
CampyloBacter Group: NOT DETECTED
Norovirus GI/GII: NOT DETECTED
Rotavirus A: NOT DETECTED
Salmonella species: NOT DETECTED
Shiga Toxin 1: NOT DETECTED
Shiga Toxin 2: NOT DETECTED
Shigella Species: NOT DETECTED
Vibrio Group: NOT DETECTED
Yersinia enterocolitica: NOT DETECTED

## 2023-06-09 NOTE — Telephone Encounter (Signed)
Patient stated he is returning a call to follow-up on test orders.

## 2023-06-09 NOTE — Telephone Encounter (Signed)
The patient was called and notified that no one from this office has documented a call and no referral to cardiology is in the chart.  Shared suspicion that perhaps some one from CT was calling pt.  Pt verbalized understanding and reported that he would call his PCP.

## 2023-06-10 LAB — GIARDIA ANTIGEN
MICRO NUMBER:: 15508773
RESULT:: NOT DETECTED
SPECIMEN QUALITY:: ADEQUATE

## 2023-06-13 ENCOUNTER — Encounter: Payer: Self-pay | Admitting: Family Medicine

## 2023-06-13 ENCOUNTER — Ambulatory Visit (INDEPENDENT_AMBULATORY_CARE_PROVIDER_SITE_OTHER): Payer: Medicare HMO | Admitting: Family Medicine

## 2023-06-13 VITALS — BP 118/62 | HR 73 | Temp 97.4°F | Ht 64.0 in | Wt 138.0 lb

## 2023-06-13 DIAGNOSIS — R1084 Generalized abdominal pain: Secondary | ICD-10-CM | POA: Diagnosis not present

## 2023-06-13 DIAGNOSIS — S301XXA Contusion of abdominal wall, initial encounter: Secondary | ICD-10-CM | POA: Insufficient documentation

## 2023-06-13 HISTORY — DX: Contusion of abdominal wall, initial encounter: S30.1XXA

## 2023-06-13 LAB — CBC WITH DIFFERENTIAL/PLATELET
Basophils Absolute: 0 10*3/uL (ref 0.0–0.1)
Basophils Relative: 0.4 % (ref 0.0–3.0)
Eosinophils Absolute: 0.4 10*3/uL (ref 0.0–0.7)
Eosinophils Relative: 3.9 % (ref 0.0–5.0)
HCT: 44.6 % (ref 39.0–52.0)
Hemoglobin: 14.6 g/dL (ref 13.0–17.0)
Lymphocytes Relative: 21.9 % (ref 12.0–46.0)
Lymphs Abs: 2.1 10*3/uL (ref 0.7–4.0)
MCHC: 32.7 g/dL (ref 30.0–36.0)
MCV: 93.2 fL (ref 78.0–100.0)
Monocytes Absolute: 0.9 10*3/uL (ref 0.1–1.0)
Monocytes Relative: 9.6 % (ref 3.0–12.0)
Neutro Abs: 6.1 10*3/uL (ref 1.4–7.7)
Neutrophils Relative %: 64.2 % (ref 43.0–77.0)
Platelets: 267 10*3/uL (ref 150.0–400.0)
RBC: 4.79 Mil/uL (ref 4.22–5.81)
RDW: 14.7 % (ref 11.5–15.5)
WBC: 9.4 10*3/uL (ref 4.0–10.5)

## 2023-06-13 LAB — PROTIME-INR
INR: 1 {ratio} (ref 0.8–1.0)
Prothrombin Time: 10.5 s (ref 9.6–13.1)

## 2023-06-13 NOTE — Progress Notes (Signed)
Patient ID: Roy Richards., male    DOB: 1961/03/20, 62 y.o.   MRN: [id number]  This visit was conducted in person.  BP 118/62   Pulse 73   Temp (!) 97.4 F (36.3 C) (Temporal)   Ht 5\' 4"  (1.626 m)   Wt 138 lb (62.6 kg)   SpO2 94%   BMI 23.69 kg/m    CC:  Chief Complaint  Patient presents with   GI Problem    Woke up Saturday morning and had a large bruise on left lower abdomen.  Pain is right above groin area.    Subjective:   HPI: Roy Richards. is a 62 y.o. male  pt of Roy Richards with history of COPD, hypertension, diabetes, renal artery stenosis and coronary artery disease presenting on 06/13/2023 for GI Problem (Woke up Saturday morning and had a large bruise on left lower abdomen. Roy Richards is right above groin area.)   Reviewed recent office visit note from May 31, 2023 with PCP at that time he had reported acute diarrhea and left lower quadrant abdominal pain for 6 weeks. C. difficile negative, Giardia negative, gastrointestinal panel negative CBC showed elevated white blood count at 12., platelets and hemoglobin normal. CT abdomen and pelvis showed no findings other than cholelithiasis and possible right lower lobe . ( Spleen normal) Told she take azithromycin course and Augmentin for 7 days.  Cough and diarrhea resolved and he felt better.  Also treated for dog bite at urgent care with amoxicillin in June, also treated with doxycycline at urgent care 2 weeks ago for COPD exacerbation.   Now in last he reports new onset contusion on left lower abdomen no injury, no fall... woke up with it 9/28.   Pain across abdomen present since he saw PCP on 9/17, but now acutely worse in last week.   He reports  abdominal pain for over 2 YEARS, seems to flare up then go away. Currently with pain across abdomen similar to in past... feels like cramping pain.  No diarrhea, no constipation... has pressure that he needs to have BM but could not,  this AM did have nml BM.  No  fever.  He is on aspirin 81 mg p.o. daily. He did take some  ibuprofen off and on last week.   No ETOH.     Relevant past medical, surgical, family and social history reviewed and updated as indicated. Interim medical history since our last visit reviewed. Allergies and medications reviewed and updated. Outpatient Medications Prior to Visit  Medication Sig Dispense Refill   albuterol (VENTOLIN HFA) 108 (90 Base) MCG/ACT inhaler Inhale 1-2 puffs into the lungs every 6 (six) hours as needed for wheezing or shortness of breath. 18 g 0   amLODipine (NORVASC) 10 MG tablet TAKE ONE TABLET BY MOUTH DAILY FOR BLOOD PRESSURE 90 tablet 3   aspirin 81 MG chewable tablet Chew 81 mg by mouth daily.     atorvastatin (LIPITOR) 40 MG tablet TAKE 1 TABLET BY MOUTH DAILY FOR CHOLESTEROL 90 tablet 2   Cholecalciferol (VITAMIN D) 50 MCG (2000 UT) tablet Take 2,000 Units by mouth daily.     citalopram (CELEXA) 40 MG tablet TAKE 1 TABLET BY MOUTH DAILY FOR ANXIETY 90 tablet 2   fluticasone-salmeterol (ADVAIR DISKUS) 250-50 MCG/ACT AEPB Inhale 1 puff into the lungs in the morning and at bedtime. 60 each 11   losartan (COZAAR) 50 MG tablet Take 1 tablet (50 mg total) by mouth daily.  for blood pressure. 90 tablet 1   Multiple Vitamins-Minerals (MULTIVITAMIN ADULT PO) Take by mouth.     sildenafil (VIAGRA) 100 MG tablet Take 1/2 to 1 tablet by mouth 30 min prior to sexual activity 30 tablet 0   amoxicillin-clavulanate (AUGMENTIN) 875-125 MG tablet Take 1 tablet by mouth 2 (two) times daily. 14 tablet 0   azithromycin (ZITHROMAX) 250 MG tablet Take 2 tablets by mouth today, then 1 tablet daily for 4 additional days. 6 tablet 0   metFORMIN (GLUCOPHAGE-XR) 500 MG 24 hr tablet Take 1 tablet (500 mg total) by mouth daily with breakfast. for diabetes. 90 tablet 1   predniSONE (DELTASONE) 20 MG tablet Take 2 tablets by mouth once daily for 5 days. 10 tablet 0   No facility-administered medications prior to visit.     Per  HPI unless specifically indicated in ROS section below Review of Systems  Constitutional:  Negative for fatigue and fever.  HENT:  Negative for ear pain.   Eyes:  Negative for pain.  Respiratory:  Negative for cough and shortness of breath.   Cardiovascular:  Negative for chest pain, palpitations and leg swelling.  Gastrointestinal:  Negative for abdominal pain.  Genitourinary:  Negative for dysuria.  Musculoskeletal:  Negative for arthralgias.  Neurological:  Negative for syncope, light-headedness and headaches.  Psychiatric/Behavioral:  Negative for dysphoric mood.    Objective:  BP 118/62   Pulse 73   Temp (!) 97.4 F (36.3 C) (Temporal)   Ht 5\' 4"  (1.626 m)   Wt 138 lb (62.6 kg)   SpO2 94%   BMI 23.69 kg/m   Wt Readings from Last 3 Encounters:  06/13/23 138 lb (62.6 kg)  05/31/23 133 lb (60.3 kg)  03/01/23 137 lb (62.1 kg)      Physical Exam Constitutional:      Appearance: He is well-developed.  HENT:     Head: Normocephalic.     Right Ear: Hearing normal.     Left Ear: Hearing normal.     Nose: Nose normal.  Neck:     Thyroid: No thyroid mass or thyromegaly.     Vascular: No carotid bruit.     Trachea: Trachea normal.  Cardiovascular:     Rate and Rhythm: Normal rate and regular rhythm.     Pulses: Normal pulses.     Heart sounds: Heart sounds not distant. No murmur heard.    No friction rub. No gallop.     Comments: No peripheral edema Pulmonary:     Effort: Pulmonary effort is normal. No respiratory distress.     Breath sounds: Normal breath sounds.  Abdominal:     Tenderness: There is no right CVA tenderness, left CVA tenderness, guarding or rebound.     Hernia: No hernia is present.  Skin:    General: Skin is warm and dry.     Findings: No rash.  Psychiatric:        Speech: Speech normal.        Behavior: Behavior normal.        Thought Content: Thought content normal.          Results for orders placed or performed in visit on 06/13/23   CBC with Differential/Platelet  Result Value Ref Range   WBC 9.4 4.0 - 10.5 K/uL   RBC 4.79 4.22 - 5.81 Mil/uL   Hemoglobin 14.6 13.0 - 17.0 g/dL   HCT 16.1 09.6 - 04.5 %   MCV 93.2 78.0 - 100.0 fl  MCHC 32.7 30.0 - 36.0 g/dL   RDW 16.1 09.6 - 04.5 %   Platelets 267.0 150.0 - 400.0 K/uL   Neutrophils Relative % 64.2 43.0 - 77.0 %   Lymphocytes Relative 21.9 12.0 - 46.0 %   Monocytes Relative 9.6 3.0 - 12.0 %   Eosinophils Relative 3.9 0.0 - 5.0 %   Basophils Relative 0.4 0.0 - 3.0 %   Neutro Abs 6.1 1.4 - 7.7 K/uL   Lymphs Abs 2.1 0.7 - 4.0 K/uL   Monocytes Absolute 0.9 0.1 - 1.0 K/uL   Eosinophils Absolute 0.4 0.0 - 0.7 K/uL   Basophils Absolute 0.0 0.0 - 0.1 K/uL  Protime-INR  Result Value Ref Range   INR 1.0 0.8 - 1.0 ratio   Prothrombin Time 10.5 9.6 - 13.1 sec    Assessment and Plan  Contusion of abdominal wall, initial encounter Assessment & Plan: Acute, unclear etiology of spontaneous bruising across left lower abdomen and suprapubic area.  Most likely suprapubic contusion due to gravity given minimal tenderness at that location. Possible contusion secondary to aggressive coughing with recent lung infection, now resolved. Of note contusion associated with firm area left mid abdomen very superficial feeling like a hematoma under the skin. Patient with significant abdominal pain that has been ongoing now for 2 months although worse here recently. Will evaluate for clotting issue and anemia. Patient did have recent CT scan 2 weeks ago and would like to avoid repeat scanning given radiation and cost.  We discussed given unknown etiology of contusion if there is evidence of significant drop in hemoglobin we will move forward with ultrasound to assess for intra-abdominal bleeding.  Return and ER precautions provided.  Orders: -     CBC with Differential/Platelet -     Protime-INR  Generalized abdominal pain Assessment & Plan: Patient's description seems to be a  combination of acute and chronic generalized abdominal pain, difficult to get clear history. Recent evaluation, thorough and unremarkable ruling out recent infection or CT abnormality. Of note diarrhea has now resolved. ?  IBS pain predominant as etiology?   Orders: -     CBC with Differential/Platelet -     Protime-INR    No follow-ups on file.   Kerby Nora, MD

## 2023-06-13 NOTE — Assessment & Plan Note (Signed)
Acute, unclear etiology of spontaneous bruising across left lower abdomen and suprapubic area.  Most likely suprapubic contusion due to gravity given minimal tenderness at that location. Possible contusion secondary to aggressive coughing with recent lung infection, now resolved. Of note contusion associated with firm area left mid abdomen very superficial feeling like a hematoma under the skin. Patient with significant abdominal pain that has been ongoing now for 2 months although worse here recently. Will evaluate for clotting issue and anemia. Patient did have recent CT scan 2 weeks ago and would like to avoid repeat scanning given radiation and cost.  We discussed given unknown etiology of contusion if there is evidence of significant drop in hemoglobin we will move forward with ultrasound to assess for intra-abdominal bleeding.  Return and ER precautions provided.

## 2023-06-13 NOTE — Telephone Encounter (Signed)
Pt already been arrived at office for appt with Dr Ermalene Searing 06/13/23 at 8:40. Sending note to Dr Ermalene Searing.

## 2023-06-13 NOTE — Assessment & Plan Note (Signed)
Patient's description seems to be a combination of acute and chronic generalized abdominal pain, difficult to get clear history. Recent evaluation, thorough and unremarkable ruling out recent infection or CT abnormality. Of note diarrhea has now resolved. ?  IBS pain predominant as etiology?

## 2023-06-14 ENCOUNTER — Ambulatory Visit: Payer: Medicare HMO | Admitting: Primary Care

## 2023-06-17 ENCOUNTER — Ambulatory Visit: Payer: Medicare HMO | Attending: Internal Medicine | Admitting: Internal Medicine

## 2023-06-17 DIAGNOSIS — K5730 Diverticulosis of large intestine without perforation or abscess without bleeding: Secondary | ICD-10-CM

## 2023-06-17 DIAGNOSIS — Z860100 Personal history of colon polyps, unspecified: Secondary | ICD-10-CM | POA: Insufficient documentation

## 2023-06-17 HISTORY — DX: Diverticulosis of large intestine without perforation or abscess without bleeding: K57.30

## 2023-06-17 HISTORY — DX: Personal history of colon polyps, unspecified: Z86.0100

## 2023-06-17 NOTE — Progress Notes (Deleted)
  Cardiology Office Note:  .   Date:  06/17/2023  ID:  Roy Richards., DOB 12-05-1960, MRN [id number] PCP: [name], NP  [facility] Providers Cardiologist:  Debbe Odea, MD { Click to update primary MD,subspecialty MD or APP then REFRESH:1}    History of Present Illness: .   Roy Richards. is a 62 y.o. male with history of hypertension, hyperlipidemia, type 2 diabetes mellitus, and COPD with ongoing tobacco use, who has been referred for evaluation of *** by Ms. Clark.  He was seen once in our office in 2020 by Dr. Azucena Cecil, which time it was noted that he had decreased blood pressure in the left arm.  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  Moderate right ICA stenosis (47%) was also seen.  The patient has not followed up since then.  ROS: See HPI  Studies Reviewed: .        *** Risk Assessment/Calculations:   {Does this patient have ATRIAL FIBRILLATION?:[id number]} No BP recorded.  {Refresh Note OR Click here to enter BP  :1}***       Physical Exam:   VS:  There were no vitals taken for this visit.   Wt Readings from Last 3 Encounters:  06/13/23 138 lb (62.6 kg)  05/31/23 133 lb (60.3 kg)  03/01/23 137 lb (62.1 kg)    General:  NAD. Neck: No JVD or HJR. Lungs: Clear to auscultation bilaterally without wheezes or crackles. Heart: Regular rate and rhythm without murmurs, rubs, or gallops. Abdomen: Soft, nontender, nondistended. Extremities: No lower extremity edema.  ASSESSMENT AND PLAN: .    ***    {Are you ordering a CV Procedure (e.g. stress test, cath, DCCV, TEE, etc)?   Press F2        :[id number]}  Dispo: ***  Signed, Yvonne Kendall, MD

## 2023-06-22 ENCOUNTER — Encounter: Payer: Self-pay | Admitting: Physician Assistant

## 2023-06-22 ENCOUNTER — Ambulatory Visit: Payer: Medicare HMO | Admitting: Physician Assistant

## 2023-06-22 ENCOUNTER — Ambulatory Visit
Admission: RE | Admit: 2023-06-22 | Discharge: 2023-06-22 | Disposition: A | Discharge status: Home / Self Care | Payer: Medicare HMO | Source: Ambulatory Visit | Attending: Physician Assistant | Admitting: Physician Assistant

## 2023-06-22 VITALS — BP 124/61 | HR 80 | Temp 97.7°F | Ht 64.0 in | Wt 140.2 lb

## 2023-06-22 DIAGNOSIS — K5900 Constipation, unspecified: Secondary | ICD-10-CM

## 2023-06-22 DIAGNOSIS — Z860101 Personal history of adenomatous and serrated colon polyps: Secondary | ICD-10-CM | POA: Diagnosis not present

## 2023-06-22 DIAGNOSIS — R197 Diarrhea, unspecified: Secondary | ICD-10-CM

## 2023-06-22 DIAGNOSIS — Z860100 Personal history of colon polyps, unspecified: Secondary | ICD-10-CM

## 2023-06-22 DIAGNOSIS — L989 Disorder of the skin and subcutaneous tissue, unspecified: Secondary | ICD-10-CM

## 2023-06-22 DIAGNOSIS — K8020 Calculus of gallbladder without cholecystitis without obstruction: Secondary | ICD-10-CM | POA: Diagnosis not present

## 2023-06-22 DIAGNOSIS — R109 Unspecified abdominal pain: Secondary | ICD-10-CM

## 2023-06-22 DIAGNOSIS — R1084 Generalized abdominal pain: Secondary | ICD-10-CM

## 2023-06-22 MED ORDER — PEG 3350-KCL-NA BICARB-NACL 420 G PO SOLR: 4000.0000 mL | Freq: Once | ORAL | 0 refills | Status: AC

## 2023-06-22 NOTE — Progress Notes (Signed)
Roy Amy, PA-C [address]  [address]  [place], [place] [address]  Main: [phone]  Fax: [phone]   Gastroenterology Consultation  Referring Provider:     [name], NP Primary Care Physician:  [name], NP Primary Gastroenterologist:  Roy Amy, PA-C / Dr. Wyline Mood   Reason for Consultation:     LLQ pain        HPI:   Roy Richards. is a 62 y.o. y/o male referred for consultation & management  by [name], NP.    Patient saw Sammuel Cooper, NP 05/31/2023 to evaluate 6-week history of LLQ abdominal pain and diarrhea.  He was switched to extended release metformin 500 Mg daily.  Stool studies 05/2023 showed Negative GI pathogen panel, C. difficile and Giardia.  Labs 05/2023 showed normal hemoglobin 14.5, mildly elevated WBC 12.4, normal BMP with GFR 96, creatinine 0.72.  A1c 6.0.  Abdominal pelvic CT 06/02/2023, to evaluate LLQ pain, showed diverticulosis but no evidence of diverticulitis.  Incidental cholelithiasis and right lower lung infiltrate suspicious for pneumonia.  Chest CT 05/2023 showed emphysema and multiple small pulmonary nodules.  He quit smoking 04/2023.  Colonoscopy 05/2021 at [facility] by Dr. Dulce Sellar showed 4 polyps removed (3 mm to 8 mm).  Fair prep.  Pathology report unavailable.  Medical history of COPD, type 2 diabetes, tobacco use, hyperlipidemia, hypertension, aortic atherosclerosis.  Current symptoms: Patient reports having diarrhea for 2 or 3 months.  Some days he has no bowel movement.  Stools have become more formed recently.  2 months ago he was treated for pneumonia with antibiotics.  He had a severe cough.  3 weeks ago he developed LLQ pain and a bruise over the left lower quadrant.  He was taken off aspirin.  The bruise was thought to due to severe coughing episode.  Patient is worried about Crohn's disease.  He has not seen any blood in his stool.  Has past history of chronic constipation, no current  treatment.  Past Medical History:  Diagnosis Date   Acute colitis 06/26/2019   Attention deficit hyperactivity disorder, combined type 09/12/2019   Bipolar II disorder (HCC) 09/12/2019   Cocaine dependence in remission (HCC) 09/12/2019   Diabetes mellitus without complication (HCC)    Diverticular disease of colon 06/17/2023   History of colon polyps 06/17/2023   Hyperlipidemia    Hypertension    Hypoxia 05/07/2020   Uncomplicated alcohol dependence (HCC) 09/12/2019    Past Surgical History:  Procedure Laterality Date   HERNIA REPAIR      Prior to Admission medications   Medication Sig Start Date End Date Taking? Authorizing Provider  albuterol (VENTOLIN HFA) 108 (90 Base) MCG/ACT inhaler Inhale 1-2 puffs into the lungs every 6 (six) hours as needed for wheezing or shortness of breath. 03/01/23   [name], NP  amLODipine (NORVASC) 10 MG tablet TAKE ONE TABLET BY MOUTH DAILY FOR BLOOD PRESSURE 01/05/23   [name], NP  atorvastatin (LIPITOR) 40 MG tablet TAKE 1 TABLET BY MOUTH DAILY FOR CHOLESTEROL 02/06/23   [name], NP  Cholecalciferol (VITAMIN D) 50 MCG (2000 UT) tablet Take 2,000 Units by mouth daily.    [provider]  citalopram (CELEXA) 40 MG tablet TAKE 1 TABLET BY MOUTH DAILY FOR ANXIETY 12/26/22   [name], NP  fluticasone-salmeterol (ADVAIR DISKUS) 250-50 MCG/ACT AEPB Inhale 1 puff into the lungs in the morning and at bedtime. 12/03/22  [name], NP  losartan (COZAAR) 50 MG tablet Take 1 tablet (50 mg total) by mouth daily. for blood pressure. 05/31/23   [name], NP  Multiple Vitamins-Minerals (MULTIVITAMIN ADULT PO) Take by mouth.    [provider]  sildenafil (VIAGRA) 100 MG tablet Take 1/2 to 1 tablet by mouth 30 min prior to sexual activity 07/13/22   [name], NP    Family History  Problem Relation Age of Onset   COPD Mother    Heart disease Father    Hyperlipidemia Father     Hypertension Father      Social History   Tobacco Use   Smoking status: Some Days    Current packs/day: 0.50    Average packs/day: 0.5 packs/day for 25.0 years (12.5 ttl pk-yrs)    Types: Cigarettes    Start date: 12/15/2022   Smokeless tobacco: Never  Vaping Use   Vaping status: Never Used  Substance Use Topics   Alcohol use: Yes    Comment: occasionally   Drug use: Never    Allergies as of 06/22/2023   (No Known Allergies)    Review of Systems:    All systems reviewed and negative except where noted in HPI.   Physical Exam:  BP 124/61   Pulse 80   Temp 97.7 F (36.5 C)   Ht 5\' 4"  (1.626 m)   Wt 140 lb 3.2 oz (63.6 kg)   BMI 24.07 kg/m  No LMP for male patient.  General:   Alert,  Well-developed, well-nourished, pleasant and cooperative in NAD Lungs:  Respirations even and unlabored.  Clear throughout to auscultation.   No wheezes, crackles, or rhonchi. No acute distress. Heart:  Regular rate and rhythm; no murmurs, clicks, rubs, or gallops. Abdomen:  Normal bowel sounds.  No bruits.  Soft, and non-distended without masses, hepatosplenomegaly or hernias noted.  Mild bilateral lower abdominal tenderness.  No upper abdominal tenderness.  There is a large bruise superficial over the LLQ.  No guarding or rebound tenderness.    Neurologic:  Alert and oriented x3;  grossly normal neurologically. Psych:  Alert and cooperative. Normal mood and affect.  Imaging Studies: CT ABDOMEN PELVIS W CONTRAST  Result Date: 06/02/2023 CLINICAL DATA:  Left lower quadrant pain and diarrhea for 1 month. EXAM: CT ABDOMEN AND PELVIS WITH CONTRAST TECHNIQUE: Multidetector CT imaging of the abdomen and pelvis was performed using the standard protocol following bolus administration of intravenous contrast. RADIATION DOSE REDUCTION: This exam was performed according to the departmental dose-optimization program which includes automated exposure control, adjustment of the mA and/or kV according to  patient size and/or use of iterative reconstruction technique. CONTRAST:  OMNIPAQUE IOHEXOL 300 MG/ML  SOLN COMPARISON:  06/18/2019 FINDINGS: Lower Chest: Multifocal infiltrate is seen in the right lower lobe which is new since previous exam suspicious for pneumonia or inflammatory process. Hepatobiliary: A few tiny sub-cm low attention lesions are too small to characterize, but most likely represent tiny cysts. No suspicious hepatic masses identified. A few small gallstones are seen, however there is no evidence of cholecystitis or biliary dilatation. Pancreas:  No mass or inflammatory changes. Spleen: Within normal limits in size and appearance. Adrenals/Urinary Tract: No suspicious masses identified. No evidence of ureteral calculi or hydronephrosis. Stomach/Bowel: No evidence of obstruction, inflammatory process or abnormal fluid collections. Normal appendix visualized. Diverticulosis is seen mainly involving the descending and sigmoid colon, however there is no evidence of diverticulitis. Vascular/Lymphatic: No pathologically enlarged lymph nodes. No  acute vascular findings. Reproductive:  No mass or other significant abnormality. Other:  None. Musculoskeletal:  No suspicious bone lesions identified. IMPRESSION: Colonic diverticulosis, without radiographic evidence of diverticulitis or other acute findings within the abdomen or pelvis. Cholelithiasis. No radiographic evidence of cholecystitis. Right lower lobe infiltrate, suspicious for pneumonia or inflammatory process. Electronically Signed   By: Danae Orleans M.D.   On: 06/02/2023 12:19    Assessment and Plan:   Everet Flagg. is a 62 y.o. y/o male has been referred for LLQ abdominal pain, diarrhea.  Recent stool studies negative for infections.  Negative C. difficile.  Labs showed normal hemoglobin, no anemia.  Recent abdominal pelvic CT showed no evidence of diverticulitis or acute abnormality to explain abdominal pain.  Incidental small  gallstone.  1.  Abdominal pain  Abdominal x-ray 1 view, evaluate stool burden.  Rule out overflow diarrhea from constipation.  2.  Constipation  If x-ray shows constipation, then treat with MiraLAX or Linzess.  3.  Diarrhea  Recent stool studies were negative for infections.  Evaluate for overflow diarrhea from constipation.  4.  History of adenomatous colon polyps  Last colonoscopy 05/2021 at [facility] showed 4 small polyps removed and fair prep.  I am scheduling a repeat colonoscopy with extra prep.  2-day prep: Clenpiq day 1, GoLytely day 2  5.  Bruise over Left Lower abdomen - Superficial, likely due to severe coughing with previous pneumonia treated with antibiotics.  Reassurance.  PCP stopped his aspirin.  Recent CBC, PT and INR were normal.  Hemoglobin 14.  Follow up 1 month after colonoscopy with TG.  Roy Amy, PA-C

## 2023-06-22 NOTE — Patient Instructions (Signed)
Abdominal xray today

## 2023-07-06 ENCOUNTER — Telehealth: Payer: Self-pay | Admitting: Physician Assistant

## 2023-07-06 NOTE — Telephone Encounter (Signed)
Patient called in and to confirm his appointments. He was concerned on why he has an appointment on 08/17/23. I explain to him that appointment was and error and he have been reschedule to 09/12/23. He asked what is the appointment for on 09/12/23. I explain to him that is a follow up after his procedure.

## 2023-07-14 ENCOUNTER — Telehealth: Payer: Self-pay

## 2023-07-14 NOTE — Progress Notes (Signed)
Call and notify patient abdominal x-ray shows small gallstones.  Otherwise normal.  No evidence of bowel obstruction or increased stool burden. Celso Amy, PA-C

## 2023-07-14 NOTE — Telephone Encounter (Signed)
Patient notified.   Call and notify patient abdominal x-ray shows small gallstones.  Otherwise normal.  No evidence of bowel obstruction or increased stool burden.  Celso Amy, PA-C

## 2023-07-18 ENCOUNTER — Ambulatory Visit: Payer: Medicare HMO | Admitting: Physician Assistant

## 2023-08-02 ENCOUNTER — Encounter: Payer: Self-pay | Admitting: Gastroenterology

## 2023-08-04 ENCOUNTER — Other Ambulatory Visit: Payer: Self-pay | Admitting: Primary Care

## 2023-08-04 DIAGNOSIS — E1159 Type 2 diabetes mellitus with other circulatory complications: Secondary | ICD-10-CM

## 2023-08-08 ENCOUNTER — Encounter: Payer: Self-pay | Admitting: Gastroenterology

## 2023-08-09 ENCOUNTER — Ambulatory Visit
Admission: RE | Admit: 2023-08-09 | Discharge: 2023-08-09 | Disposition: A | Discharge status: Home / Self Care | Payer: Medicare HMO | Attending: Gastroenterology | Admitting: Gastroenterology

## 2023-08-09 ENCOUNTER — Encounter
Admission: RE | Disposition: A | Discharge status: Home / Self Care | Payer: Self-pay | Source: Home / Self Care | Attending: Gastroenterology

## 2023-08-09 ENCOUNTER — Other Ambulatory Visit: Payer: Self-pay

## 2023-08-09 ENCOUNTER — Ambulatory Visit: Payer: Medicare HMO | Admitting: Certified Registered"

## 2023-08-09 ENCOUNTER — Other Ambulatory Visit: Payer: Self-pay | Admitting: Primary Care

## 2023-08-09 ENCOUNTER — Encounter: Payer: Self-pay | Admitting: Gastroenterology

## 2023-08-09 DIAGNOSIS — I2510 Atherosclerotic heart disease of native coronary artery without angina pectoris: Secondary | ICD-10-CM | POA: Diagnosis not present

## 2023-08-09 DIAGNOSIS — K5730 Diverticulosis of large intestine without perforation or abscess without bleeding: Secondary | ICD-10-CM | POA: Diagnosis not present

## 2023-08-09 DIAGNOSIS — K529 Noninfective gastroenteritis and colitis, unspecified: Secondary | ICD-10-CM | POA: Diagnosis not present

## 2023-08-09 DIAGNOSIS — Z7984 Long term (current) use of oral hypoglycemic drugs: Secondary | ICD-10-CM | POA: Insufficient documentation

## 2023-08-09 DIAGNOSIS — K5900 Constipation, unspecified: Secondary | ICD-10-CM

## 2023-08-09 DIAGNOSIS — J4489 Other specified chronic obstructive pulmonary disease: Secondary | ICD-10-CM | POA: Diagnosis not present

## 2023-08-09 DIAGNOSIS — K635 Polyp of colon: Secondary | ICD-10-CM | POA: Diagnosis not present

## 2023-08-09 DIAGNOSIS — F17210 Nicotine dependence, cigarettes, uncomplicated: Secondary | ICD-10-CM | POA: Diagnosis not present

## 2023-08-09 DIAGNOSIS — D125 Benign neoplasm of sigmoid colon: Secondary | ICD-10-CM | POA: Insufficient documentation

## 2023-08-09 DIAGNOSIS — D126 Benign neoplasm of colon, unspecified: Secondary | ICD-10-CM

## 2023-08-09 DIAGNOSIS — I10 Essential (primary) hypertension: Secondary | ICD-10-CM | POA: Diagnosis not present

## 2023-08-09 DIAGNOSIS — Z8249 Family history of ischemic heart disease and other diseases of the circulatory system: Secondary | ICD-10-CM | POA: Insufficient documentation

## 2023-08-09 DIAGNOSIS — E119 Type 2 diabetes mellitus without complications: Secondary | ICD-10-CM | POA: Diagnosis not present

## 2023-08-09 DIAGNOSIS — D122 Benign neoplasm of ascending colon: Secondary | ICD-10-CM | POA: Diagnosis not present

## 2023-08-09 DIAGNOSIS — F319 Bipolar disorder, unspecified: Secondary | ICD-10-CM | POA: Insufficient documentation

## 2023-08-09 DIAGNOSIS — R197 Diarrhea, unspecified: Secondary | ICD-10-CM | POA: Diagnosis present

## 2023-08-09 DIAGNOSIS — D123 Benign neoplasm of transverse colon: Secondary | ICD-10-CM | POA: Insufficient documentation

## 2023-08-09 DIAGNOSIS — F419 Anxiety disorder, unspecified: Secondary | ICD-10-CM | POA: Insufficient documentation

## 2023-08-09 DIAGNOSIS — J449 Chronic obstructive pulmonary disease, unspecified: Secondary | ICD-10-CM

## 2023-08-09 HISTORY — PX: POLYPECTOMY: SHX5525

## 2023-08-09 HISTORY — PX: COLONOSCOPY WITH PROPOFOL: SHX5780

## 2023-08-09 HISTORY — PX: BIOPSY: SHX5522

## 2023-08-09 SURGERY — COLONOSCOPY WITH PROPOFOL
Anesthesia: General

## 2023-08-09 MED ORDER — ALBUTEROL SULFATE HFA 108 (90 BASE) MCG/ACT IN AERS: 1.0000 | INHALATION_SPRAY | Freq: Four times a day (QID) | RESPIRATORY_TRACT | 0 refills | Status: DC | PRN

## 2023-08-09 MED ORDER — SODIUM CHLORIDE 0.9 % IV SOLN: INTRAVENOUS | Status: DC

## 2023-08-09 MED ORDER — PROPOFOL 10 MG/ML IV BOLUS: INTRAVENOUS | Status: DC | PRN

## 2023-08-09 MED ORDER — PROPOFOL 10 MG/ML IV BOLUS
INTRAVENOUS | Status: DC | PRN
  Administered 2023-08-09: 125 ug/kg/min via INTRAVENOUS

## 2023-08-09 MED ORDER — PROPOFOL 1000 MG/100ML IV EMUL
INTRAVENOUS | Status: AC
  Filled 2023-08-09: qty 400

## 2023-08-09 MED ORDER — LIDOCAINE HCL (CARDIAC) PF 100 MG/5ML IV SOSY
PREFILLED_SYRINGE | INTRAVENOUS | Status: DC | PRN
  Administered 2023-08-09: 100 mg via INTRAVENOUS

## 2023-08-09 MED ORDER — PHENYLEPHRINE 80 MCG/ML (10ML) SYRINGE FOR IV PUSH (FOR BLOOD PRESSURE SUPPORT)
PREFILLED_SYRINGE | INTRAVENOUS | Status: DC | PRN
  Administered 2023-08-09: 120 ug via INTRAVENOUS
  Administered 2023-08-09: 80 ug via INTRAVENOUS

## 2023-08-09 NOTE — Anesthesia Preprocedure Evaluation (Signed)
Anesthesia Evaluation  Patient identified by MRN, date of birth, ID band Patient awake    Reviewed: Allergy & Precautions, H&P , NPO status , Patient's Chart, lab work & pertinent test results, reviewed documented beta blocker date and time   History of Anesthesia Complications Negative for: history of anesthetic complications  Airway Mallampati: III  TM Distance: >3 FB Neck ROM: full    Dental  (+) Partial Upper, Partial Lower, Missing, Dental Advidsory Given, Implants   Pulmonary neg shortness of breath, asthma , Continuous Positive Airway Pressure Ventilation , COPD,  COPD inhaler, neg recent URI, Current Smoker and Patient abstained from smoking.   Pulmonary exam normal breath sounds clear to auscultation       Cardiovascular Exercise Tolerance: Good hypertension, (-) angina + CAD  (-) Past MI and (-) Cardiac Stents Normal cardiovascular exam(-) dysrhythmias (-) Valvular Problems/Murmurs Rhythm:regular Rate:Normal     Neuro/Psych  PSYCHIATRIC DISORDERS Anxiety Depression Bipolar Disorder   negative neurological ROS     GI/Hepatic negative GI ROS, Neg liver ROS,,,  Endo/Other  diabetes    Renal/GU negative Renal ROS  negative genitourinary   Musculoskeletal   Abdominal   Peds  Hematology negative hematology ROS (+)   Anesthesia Other Findings Past Medical History: 06/26/2019: Acute colitis 09/12/2019: Attention deficit hyperactivity disorder, combined type 09/12/2019: Bipolar II disorder (HCC) 09/12/2019: Cocaine dependence in remission (HCC) No date: Diabetes mellitus without complication (HCC) 06/17/2023: Diverticular disease of colon 06/17/2023: History of colon polyps No date: Hyperlipidemia No date: Hypertension 05/07/2020: Hypoxia 09/12/2019: Uncomplicated alcohol dependence (HCC)   Reproductive/Obstetrics negative OB ROS                             Anesthesia  Physical Anesthesia Plan  ASA: 3  Anesthesia Plan: General   Post-op Pain Management:    Induction: Intravenous  PONV Risk Score and Plan: 1 and Propofol infusion and TIVA  Airway Management Planned: Natural Airway and Nasal Cannula  Additional Equipment:   Intra-op Plan:   Post-operative Plan:   Informed Consent: I have reviewed the patients History and Physical, chart, labs and discussed the procedure including the risks, benefits and alternatives for the proposed anesthesia with the patient or authorized representative who has indicated his/her understanding and acceptance.     Dental Advisory Given  Plan Discussed with: Anesthesiologist, CRNA and Surgeon  Anesthesia Plan Comments:        Anesthesia Quick Evaluation

## 2023-08-09 NOTE — Transfer of Care (Signed)
Immediate Anesthesia Transfer of Care Note  Patient: Roy Richards.  Procedure(s) Performed: COLONOSCOPY WITH PROPOFOL BIOPSY POLYPECTOMY  Patient Location: PACU  Anesthesia Type:General  Level of Consciousness: awake and alert   Airway & Oxygen Therapy: Patient Spontanous Breathing  Post-op Assessment: Report given to RN and Post -op Vital signs reviewed and stable  Post vital signs: Reviewed and stable  Last Vitals: Neo as noted for BP   Pt awake , laughing talking coherantly.   Vitals Value Taken Time  BP 86/40 08/09/23 0829  Temp 36.1 C 08/09/23 0828  Pulse 66 08/09/23 0831  Resp 16 08/09/23 0831  SpO2 93 % 08/09/23 0831  Vitals shown include unfiled device data.  Last Pain:  Vitals:   08/09/23 0828  TempSrc: Temporal  PainSc: Asleep         Complications: No notable events documented.

## 2023-08-09 NOTE — Op Note (Signed)
[facility] Patient Name: Roy Richards Procedure Date: 08/09/2023 7:58 AM MRN: [id number] Account #: [id number] Date of Birth: 02-22-1961 Admit Type: Outpatient Age: 62 Room: [facility] ENDO ROOM 2 Gender: Male Note Status: Finalized Instrument Name: Nelda Marseille [id number] Procedure:             Colonoscopy Indications:           Chronic diarrhea Providers:             Wyline Mood MD, MD Referring MD:          Wyline Mood MD, MD (Referring MD), [name]                         (Referring MD) Medicines:             Monitored Anesthesia Care Complications:         No immediate complications. Procedure:             Pre-Anesthesia Assessment:                        - Prior to the procedure, a History and Physical was                         performed, and patient medications, allergies and                         sensitivities were reviewed. The patient's tolerance                         of previous anesthesia was reviewed.                        - The risks and benefits of the procedure and the                         sedation options and risks were discussed with the                         patient. All questions were answered and informed                         consent was obtained.                        - ASA Grade Assessment: II - A patient with mild                         systemic disease.                        After obtaining informed consent, the colonoscope was                         passed under direct vision. Throughout the procedure,                         the patient's blood pressure, pulse, and oxygen                         saturations were monitored continuously. The  Colonoscope was introduced through the anus and                         advanced to the the cecum, identified by the                         appendiceal orifice. The colonoscopy was performed                         with ease. The patient  tolerated the procedure well.                         The quality of the bowel preparation was excellent.                         The ileocecal valve, appendiceal orifice, and rectum                         were photographed. Findings:      The perianal and digital rectal examinations were normal.      Three sessile polyps were found in the sigmoid colon. The polyps were 4       to 5 mm in size. These polyps were removed with a cold snare. Resection       and retrieval were complete.      An 8 mm polyp was found in the transverse colon. The polyp was sessile.       The polyp was removed with a cold snare. Resection and retrieval were       complete.      A 5 mm polyp was found in the ascending colon. The polyp was sessile.       The polyp was removed with a cold snare. Resection and retrieval were       complete.      Normal mucosa was found in the entire colon. Biopsies were taken with a       cold forceps for histology.      Multiple medium-mouthed diverticula were found in the sigmoid colon.      The exam was otherwise without abnormality on direct and retroflexion       views. Impression:            - Three 4 to 5 mm polyps in the sigmoid colon, removed                         with a cold snare. Resected and retrieved.                        - One 8 mm polyp in the transverse colon, removed with                         a cold snare. Resected and retrieved.                        - One 5 mm polyp in the ascending colon, removed with                         a cold snare. Resected and retrieved.                        -  Normal mucosa in the entire examined colon. Biopsied.                        - Diverticulosis in the sigmoid colon.                        - The examination was otherwise normal on direct and                         retroflexion views. Recommendation:        - Discharge patient to home (with escort).                        - Resume previous diet.                         - Continue present medications.                        - Await pathology results.                        - Repeat colonoscopy in 3 - 5 years for surveillance                         based on pathology results. Procedure Code(s):     --- Professional ---                        [phone], Colonoscopy, flexible; with removal of                         tumor(s), polyp(s), or other lesion(s) by snare                         technique                        45380, 59, Colonoscopy, flexible; with biopsy, single                         or multiple Diagnosis Code(s):     --- Professional ---                        D12.5, Benign neoplasm of sigmoid colon                        D12.3, Benign neoplasm of transverse colon (hepatic                         flexure or splenic flexure)                        K52.9, Noninfective gastroenteritis and colitis,                         unspecified                        K57.30, Diverticulosis of large intestine without  perforation or abscess without bleeding CPT copyright 2022 American Medical Association. All rights reserved. The codes documented in this report are preliminary and upon coder review may  be revised to meet current compliance requirements. Wyline Mood, MD Wyline Mood MD, MD 08/09/2023 8:28:45 AM This report has been signed electronically. Number of Addenda: 0 Note Initiated On: 08/09/2023 7:58 AM Scope Withdrawal Time: 0 hours 13 minutes 24 seconds  Total Procedure Duration: 0 hours 15 minutes 17 seconds  Estimated Blood Loss:  Estimated blood loss: none.      [facility]

## 2023-08-09 NOTE — H&P (Signed)
Wyline Mood, MD [address], [address], [place], [place], [address] [address], [place], [place], [address] Phone: [phone]  Fax: [phone]  Primary Care Physician:  [name], NP   Pre-Procedure History & Physical: HPI:  Roy Maris. is a 62 y.o. male is here for an colonoscopy.   Past Medical History:  Diagnosis Date   Acute colitis 06/26/2019   Attention deficit hyperactivity disorder, combined type 09/12/2019   Bipolar II disorder (HCC) 09/12/2019   Cocaine dependence in remission (HCC) 09/12/2019   Diabetes mellitus without complication (HCC)    Diverticular disease of colon 06/17/2023   History of colon polyps 06/17/2023   Hyperlipidemia    Hypertension    Hypoxia 05/07/2020   Uncomplicated alcohol dependence (HCC) 09/12/2019    Past Surgical History:  Procedure Laterality Date   HERNIA REPAIR      Prior to Admission medications   Medication Sig Start Date End Date Taking? Authorizing Provider  amLODipine (NORVASC) 10 MG tablet TAKE ONE TABLET BY MOUTH DAILY FOR BLOOD PRESSURE 01/05/23  Yes [name], NP  atorvastatin (LIPITOR) 40 MG tablet TAKE 1 TABLET BY MOUTH DAILY FOR CHOLESTEROL 02/06/23  Yes [name], NP  Cholecalciferol (VITAMIN D) 50 MCG (2000 UT) tablet Take 2,000 Units by mouth daily.   Yes [provider]  citalopram (CELEXA) 40 MG tablet TAKE 1 TABLET BY MOUTH DAILY FOR ANXIETY 12/26/22  Yes [name], NP  fluticasone-salmeterol (ADVAIR DISKUS) 250-50 MCG/ACT AEPB Inhale 1 puff into the lungs in the morning and at bedtime. 12/03/22  Yes [name], NP  losartan (COZAAR) 50 MG tablet Take 1 tablet (50 mg total) by mouth daily. for blood pressure. 05/31/23  Yes [name], NP  metFORMIN (GLUCOPHAGE-XR) 500 MG 24 hr tablet Take 500 mg by mouth daily with breakfast.   Yes [provider]  albuterol (VENTOLIN HFA) 108 (90 Base) MCG/ACT inhaler Inhale 1-2 puffs into  the lungs every 6 (six) hours as needed for wheezing or shortness of breath. 03/01/23   [name], NP  aspirin 81 MG chewable tablet Chew 81 mg by mouth daily. Patient not taking: Reported on 08/02/2023    [provider]  Multiple Vitamins-Minerals (MULTIVITAMIN ADULT PO) Take by mouth.    [provider]  sildenafil (VIAGRA) 100 MG tablet Take 1/2 to 1 tablet by mouth 30 min prior to sexual activity 07/13/22   [name], NP    Allergies as of 06/22/2023   (No Known Allergies)    Family History  Problem Relation Age of Onset   COPD Mother    Heart disease Father    Hyperlipidemia Father    Hypertension Father     Social History   Socioeconomic History   Marital status: Divorced    Spouse name: Not on file   Number of children: Not on file   Years of education: Not on file   Highest education level: Not on file  Occupational History   Not on file  Tobacco Use   Smoking status: Some Days    Current packs/day: 0.50    Average packs/day: 0.5 packs/day for 25.1 years (12.6 ttl pk-yrs)    Types: Cigarettes    Start date: 12/15/2022   Smokeless tobacco: Never  Vaping Use   Vaping status: Never Used  Substance and Sexual Activity   Alcohol use: Yes    Comment: occasionally   Drug use: Never  Sexual activity: Yes  Other Topics Concern   Not on file  Social History Narrative   Not on file   Social Determinants of Health   Financial Resource Strain: Low Risk  (01/05/2023)   Overall Financial Resource Strain (CARDIA)    Difficulty of Paying Living Expenses: Not hard at all  Food Insecurity: No Food Insecurity (01/05/2023)   Hunger Vital Sign    Worried About Running Out of Food in the Last Year: Never true    Ran Out of Food in the Last Year: Never true  Transportation Needs: No Transportation Needs (01/05/2023)   PRAPARE - Administrator, Civil Service (Medical): No    Lack of Transportation (Non-Medical): No  Physical  Activity: Inactive (01/05/2023)   Exercise Vital Sign    Days of Exercise per Week: 0 days    Minutes of Exercise per Session: 0 min  Stress: No Stress Concern Present (01/05/2023)   [employer] of Occupational Health - Occupational Stress Questionnaire    Feeling of Stress : Not at all  Social Connections: Socially Isolated (01/05/2023)   Social Connection and Isolation Panel [NHANES]    Frequency of Communication with Friends and Family: More than three times a week    Frequency of Social Gatherings with Friends and Family: More than three times a week    Attends Religious Services: Never    Database administrator or Organizations: No    Attends Banker Meetings: Never    Marital Status: Divorced  Catering manager Violence: Not At Risk (01/05/2023)   Humiliation, Afraid, Rape, and Kick questionnaire    Fear of Current or Ex-Partner: No    Emotionally Abused: No    Physically Abused: No    Sexually Abused: No    Review of Systems: See HPI, otherwise negative ROS  Physical Exam: BP 132/69 Comment: right arm  Pulse 60   Temp (!) 97.1 F (36.2 C) (Temporal)   Resp 16   Wt 63 kg   SpO2 94%   BMI 23.82 kg/m  General:   Alert,  pleasant and cooperative in NAD Head:  Normocephalic and atraumatic. Neck:  Supple; no masses or thyromegaly. Lungs:  Clear throughout to auscultation, normal respiratory effort.    Heart:  +S1, +S2, Regular rate and rhythm, No edema. Abdomen:  Soft, nontender and nondistended. Normal bowel sounds, without guarding, and without rebound.   Neurologic:  Alert and  oriented x4;  grossly normal neurologically.  Impression/Plan: Roy Nine. is here for an colonoscopy to be performed for diarrhea.  Risks, benefits, limitations, and alternatives regarding  colonoscopy have been reviewed with the patient.  Questions have been answered.  All parties agreeable.   Wyline Mood, MD  08/09/2023, 7:45 AM

## 2023-08-10 LAB — SURGICAL PATHOLOGY

## 2023-08-15 ENCOUNTER — Encounter: Payer: Self-pay | Admitting: Gastroenterology

## 2023-08-15 NOTE — Anesthesia Postprocedure Evaluation (Addendum)
Anesthesia Post Note  Patient: Roy Richards.  Procedure(s) Performed: COLONOSCOPY WITH PROPOFOL BIOPSY POLYPECTOMY  Patient location during evaluation: Endoscopy Anesthesia Type: General Level of consciousness: awake and alert Pain management: pain level controlled Vital Signs Assessment: post-procedure vital signs reviewed and stable Respiratory status: spontaneous breathing, nonlabored ventilation, respiratory function stable and patient connected to nasal cannula oxygen Cardiovascular status: blood pressure returned to baseline and stable Postop Assessment: no apparent nausea or vomiting Anesthetic complications: no   No notable events documented.   Last Vitals:  Vitals:   08/09/23 0838 08/09/23 0848  BP:  135/68  Pulse: 77 71  Resp:    Temp:    SpO2: 99% 99%    Last Pain:  Vitals:   08/09/23 0848  TempSrc:   PainSc: 0-No pain                 Lenard Simmer

## 2023-08-15 NOTE — Addendum Note (Signed)
Addendum  created 08/15/23 3244 by Lenard Simmer, MD   Clinical Note Signed

## 2023-08-29 ENCOUNTER — Encounter: Payer: Self-pay | Admitting: Dermatology

## 2023-08-29 ENCOUNTER — Ambulatory Visit: Payer: Medicare HMO | Admitting: Dermatology

## 2023-08-29 VITALS — BP 123/80 | HR 69

## 2023-08-29 DIAGNOSIS — L578 Other skin changes due to chronic exposure to nonionizing radiation: Secondary | ICD-10-CM | POA: Diagnosis not present

## 2023-08-29 DIAGNOSIS — L814 Other melanin hyperpigmentation: Secondary | ICD-10-CM

## 2023-08-29 DIAGNOSIS — L821 Other seborrheic keratosis: Secondary | ICD-10-CM

## 2023-08-29 DIAGNOSIS — L820 Inflamed seborrheic keratosis: Secondary | ICD-10-CM | POA: Diagnosis not present

## 2023-08-29 DIAGNOSIS — W908XXA Exposure to other nonionizing radiation, initial encounter: Secondary | ICD-10-CM

## 2023-08-29 DIAGNOSIS — L720 Epidermal cyst: Secondary | ICD-10-CM | POA: Diagnosis not present

## 2023-08-29 NOTE — Progress Notes (Signed)
   New Patient Visit   Subjective  Roy Richards. is a 62 y.o. male who presents for the following: New Pt - Multiple Nevi  Patient states he  has nevi located at the head & neck that he would like to have examined. Patient reports the areas have been there for ranging from 52mo to 10 years. He reports the areas are not bothersome.Patient rates irritation 0 out of 10. He states that the areas have not spread. Patient reports he  has not previously been treated for these areas. Patient denies Hx of bx. Patient denies family history of skin cancer(s).  The patient has spots, moles and lesions to be evaluated, some may be new or changing and the patient may have concern these could be cancer.   The following portions of the chart were reviewed this encounter and updated as appropriate: medications, allergies, medical history  Review of Systems:  No other skin or systemic complaints except as noted in HPI or Assessment and Plan.  Objective  Well appearing patient in no apparent distress; mood and affect are within normal limits.   A focused examination was performed of the following areas: upper body exam   Relevant exam findings are noted in the Assessment and Plan.  Left Anterior Neck, Left Frontal Scalp, Right Forearm - Anterior Inflamed seborrheic keratosis  Assessment & Plan   SEBORRHEIC KERATOSIS - Stuck-on, waxy, tan-brown papules and/or plaques  - Benign-appearing - Discussed benign etiology and prognosis. - Observe - Call for any changes   MILD ACTINIC DAMAGE - chronic, secondary to cumulative UV radiation exposure/sun exposure over time - diffuse scaly erythematous macules with underlying dyspigmentation - Recommend daily broad spectrum sunscreen SPF 30+ to sun-exposed areas, reapply every 2 hours as needed.  - Recommend staying in the shade or wearing long sleeves, sun glasses (UVA+UVB protection) and wide brim hats (4-inch brim around the entire circumference of the  hat). - Call for new or changing lesions.    EPIDERMAL INCLUSION CYST Exam: 1.5cm Subcutaneous nodule at right buttock  Benign-appearing. Exam most consistent with an epidermal inclusion cyst. Discussed that a cyst is a benign growth that can grow over time and sometimes get irritated or inflamed. Recommend observation if it is not bothersome. Discussed option of surgical excision to remove it if it is growing, symptomatic, or other changes noted. Please call for new or changing lesions so they can be evaluated.  INFLAMED SEBORRHEIC KERATOSIS (3) Left Anterior Neck, Left Frontal Scalp, Right Forearm - Anterior Destruction of lesion - Left Anterior Neck, Left Frontal Scalp, Right Forearm - Anterior Complexity: simple   Destruction method: cryotherapy   Informed consent: discussed and consent obtained   Timeout:  patient name, date of birth, surgical site, and procedure verified Lesion destroyed using liquid nitrogen: Yes   Region frozen until ice ball extended beyond lesion: Yes   Outcome: patient tolerated procedure well with no complications   Post-procedure details: wound care instructions given     No follow-ups on file.    Documentation: I have reviewed the above documentation for accuracy and completeness, and I agree with the above.  I, Shirron Marcha Solders, CMA, am acting as scribe for [employer], DO.   Langston Reusing, DO

## 2023-08-29 NOTE — Patient Instructions (Addendum)
Hello Roy Richards,  Thank you for visiting my office today. Your dedication to monitoring and improving your skin health is greatly appreciated. Below is a summary of the essential points from our consultation today:  - Skin Examination: Today's examination revealed multiple seborrheic keratoses and lentigines, which are benign and not indicative of cancer. Additionally, we observed some benign nevi and cherry angiomas.  - Treatment Administered: Cryotherapy (freezing) was performed on an inflamed seborrheic keratosis located on your left neck and right forearm to reduce discomfort.  - Skin Care Advice:   - Aquaphor Application: To promote healing, apply Aquaphor to the treated areas once or twice daily. A sample has been provided, and it is recommended to purchase a larger tube for continued care.   - Exfoliation Recommendation: Regular exfoliation during showers is advised to prevent oil accumulation and potential cyst formation.   Please do not hesitate to reach out if you have any further questions or concerns. Wishing you a healthy and happy New Year!  Warm regards,  Dr. [facility]    Cryotherapy Aftercare  Wash gently with soap and water everyday.   Apply Vaseline and Band-Aid daily until healed.    Important Information  Due to recent changes in healthcare laws, you may see results of your pathology and/or laboratory studies on MyChart before the doctors have had a chance to review them. We understand that in some cases there may be results that are confusing or concerning to you. Please understand that not all results are received at the same time and often the doctors may need to interpret multiple results in order to provide you with the best plan of care or course of treatment. Therefore, we ask that you please give [place] 2 business days to thoroughly review all your results before contacting the office for clarification. Should we see a critical lab result, you will be  contacted sooner.   If You Need Anything After Your Visit  If you have any questions or concerns for your doctor, please call our main line at [phone] If no one answers, please leave a voicemail as directed and we will return your call as soon as possible. Messages left after 4 pm will be answered the following business day.   You may also send [place] a message via MyChart. We typically respond to MyChart messages within 1-2 business days.  For prescription refills, please ask your pharmacy to contact our office. Our fax number is [phone].  If you have an urgent issue when the clinic is closed that cannot wait until the next business day, you can page your doctor at the number below.    Please note that while we do our best to be available for urgent issues outside of office hours, we are not available 24/7.   If you have an urgent issue and are unable to reach [place], you may choose to seek medical care at your doctor's office, retail clinic, urgent care center, or emergency room.  If you have a medical emergency, please immediately call 911 or go to the emergency department. In the event of inclement weather, please call our main line at [phone] for an update on the status of any delays or closures.  Dermatology Medication Tips: Please keep the boxes that topical medications come in in order to help keep track of the instructions about where and how to use these. Pharmacies typically print the medication instructions only on the boxes and not directly on the medication tubes.  If your medication is too expensive, please contact our office at [phone] or send [place] a message through MyChart.   We are unable to tell what your co-pay for medications will be in advance as this is different depending on your insurance coverage. However, we may be able to find a substitute medication at lower cost or fill out paperwork to get insurance to cover a needed medication.   If a prior  authorization is required to get your medication covered by your insurance company, please allow [place] 1-2 business days to complete this process.  Drug prices often vary depending on where the prescription is filled and some pharmacies may offer cheaper prices.  The website www.goodrx.com contains coupons for medications through different pharmacies. The prices here do not account for what the cost may be with help from insurance (it may be cheaper with your insurance), but the website can give you the price if you did not use any insurance.  - You can print the associated coupon and take it with your prescription to the pharmacy.  - You may also stop by our office during regular business hours and pick up a GoodRx coupon card.  - If you need your prescription sent electronically to a different pharmacy, notify our office through [facility] or by phone at [phone]

## 2023-08-29 NOTE — Progress Notes (Signed)
Aortic cardiology Office Note  Date:  08/30/2023   ID:  Roy Richards., DOB [birth year], MRN [id number]  PCP:  [name], NP   Chief Complaint  Patient presents with   New Patient (Initial Visit)    Referred by Vernona Rieger, FNP for aortic atherosclerosis, coronary calcification; "CAD noted on CT chest"  Patient c/o shortness of breath with over exertion. Medications reviewed by the patient verbally.        HPI:  [name] is a 62 year old gentleman with past medical history of Smoking, 2 ppwk Diabetes type 2 Hyperlipidemia, treated Essential hypertension Who presents by referral from Vernona Rieger for aortic atherosclerosis, coronary calcification  CT scan chest September 2024 Images pulled up and reviewed atherosclerosis of the great vessels of the mediastinum and the coronary arteries, including calcified atherosclerotic plaque in the left main, left anterior descending and right coronary arteries. Mild emphysema  Active, rides motorcycles, camping Lives alone, does house chores [place] Some SOB with groceries Denies chest pain on exertion concerning for angina  Lab work reviewed A1c 6.0 Total cholesterol 93 LDL 16  EKG personally reviewed by myself on todays visit EKG Interpretation Date/Time:  Tuesday August 30 2023 09:35:08 EST Ventricular Rate:  67 PR Interval:  114 QRS Duration:  84 QT Interval:  414 QTC Calculation: 437 R Axis:   98  Text Interpretation: Normal sinus rhythm Rightward axis consider Septal infarct (cited on or before 14-Oct-2022) When compared with ECG of 14-Oct-2022 14:34, QRS axis Shifted right Non-specific change in ST segment in Inferior leads T wave inversion no longer evident in Inferior leads T wave amplitude has decreased in Lateral leads Confirmed by Julien Nordmann [phone]) on 08/30/2023 9:37:42 AM     PMH:   has a past medical history of Acute colitis (06/26/2019), Attention deficit hyperactivity disorder,  combined type (09/12/2019), Bipolar II disorder (HCC) (09/12/2019), Cocaine dependence in remission (HCC) (09/12/2019), Diabetes mellitus without complication (HCC), 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  PSH:    Past Surgical History:  Procedure Laterality Date   BIOPSY  08/09/2023   Procedure: BIOPSY;  Surgeon: Wyline Mood, MD;  Location: [facility] ENDOSCOPY;  Service: Gastroenterology;;   COLONOSCOPY WITH PROPOFOL N/A 08/09/2023   Procedure: COLONOSCOPY WITH PROPOFOL;  Surgeon: Wyline Mood, MD;  Location: [facility] ENDOSCOPY;  Service: Gastroenterology;  Laterality: N/A;   HERNIA REPAIR     POLYPECTOMY  08/09/2023   Procedure: POLYPECTOMY;  Surgeon: Wyline Mood, MD;  Location: [facility] ENDOSCOPY;  Service: Gastroenterology;;    Current Outpatient Medications  Medication Sig Dispense Refill   albuterol (VENTOLIN HFA) 108 (90 Base) MCG/ACT inhaler Inhale 1-2 puffs into the lungs every 6 (six) hours as needed for wheezing or shortness of breath. 18 g 0   amLODipine (NORVASC) 10 MG tablet TAKE ONE TABLET BY MOUTH DAILY FOR BLOOD PRESSURE 90 tablet 3   atorvastatin (LIPITOR) 40 MG tablet TAKE 1 TABLET BY MOUTH DAILY FOR CHOLESTEROL 90 tablet 2   Cholecalciferol (VITAMIN D) 50 MCG (2000 UT) tablet Take 2,000 Units by mouth daily.     citalopram (CELEXA) 40 MG tablet TAKE 1 TABLET BY MOUTH DAILY FOR ANXIETY 90 tablet 2   fluticasone-salmeterol (ADVAIR DISKUS) 250-50 MCG/ACT AEPB Inhale 1 puff into the lungs in the morning and at bedtime. 60 each 11   losartan (COZAAR) 50 MG tablet Take 1 tablet (50 mg total) by mouth daily. for blood pressure.  90 tablet 1   metFORMIN (GLUCOPHAGE-XR) 500 MG 24 hr tablet Take 500 mg by mouth daily with breakfast.     Multiple Vitamins-Minerals (MULTIVITAMIN ADULT PO) Take by mouth.     sildenafil (VIAGRA) 100 MG tablet Take 1/2  to 1 tablet by mouth 30 min prior to sexual activity 30 tablet 0   No current facility-administered medications for this visit.     Allergies:   Patient has no known allergies.   Social History:  The patient  reports that he has been smoking cigarettes. He started smoking about 8 months ago. He has a 12.6 pack-year smoking history. He has never used smokeless tobacco. He reports current alcohol use. He reports that he does not use drugs.   Family History:   family history includes COPD in his mother; Heart attack (age of onset: 64) in his father; Heart disease in his father; Hyperlipidemia in his father; Hypertension in his father.    Review of Systems: Review of Systems  Constitutional: Negative.   HENT: Negative.    Respiratory: Negative.    Cardiovascular: Negative.   Gastrointestinal: Negative.   Musculoskeletal: Negative.   Neurological: Negative.   Psychiatric/Behavioral: Negative.    All other systems reviewed and are negative.   PHYSICAL EXAM: VS:  BP (!) 80/60 (BP Location: Left Arm, Patient Position: Sitting, Cuff Size: Normal)   Pulse 67   Ht 5\' 5"  (1.651 m)   Wt 147 lb 4 oz (66.8 kg)   SpO2 92%   BMI 24.50 kg/m  , BMI Body mass index is 24.5 kg/m. Low blood pressure left arm, blood pressure on the right 110/58 GEN: Well nourished, well developed, in no acute distress HEENT: normal Neck: no JVD, carotid bruits, or masses Cardiac: RRR; no murmurs, rubs, or gallops,no edema  Respiratory:  clear to auscultation bilaterally, normal work of breathing GI: soft, nontender, nondistended, + BS MS: no deformity or atrophy Skin: warm and dry, no rash Neuro:  Strength and sensation are intact Psych: euthymic mood, full affect   Recent Labs: 10/14/2022: B Natriuretic Peptide 110.6 11/30/2022: ALT 17 05/31/2023: BUN 12; Creatinine, Ser 0.72; Potassium 4.2; Sodium 139 06/13/2023: Hemoglobin 14.6; Platelets 267.0    Lipid Panel Lab Results  Component Value Date   CHOL  93 11/30/2022   HDL 56.70 11/30/2022   LDLCALC 16 11/30/2022   TRIG 102.0 11/30/2022      Wt Readings from Last 3 Encounters:  08/30/23 147 lb 4 oz (66.8 kg)  08/09/23 138 lb 12.8 oz (63 kg)  06/22/23 140 lb 3.2 oz (63.6 kg)       ASSESSMENT AND PLAN:  Problem List Items Addressed This Visit       Cardiology Problems   Coronary artery disease involving native coronary artery of native heart without angina pectoris - Primary   Relevant Orders   EKG 12-Lead (Completed)   Left subclavian artery occlusion   Relevant Orders   EKG 12-Lead (Completed)     Other   Diabetes (HCC)   Relevant Orders   EKG 12-Lead (Completed)   Tobacco dependence with current use   Aortic atherosclerosis CT scan images pulled up and reviewed in the office today, mild diffuse aorta atherosclerosis in the setting of long smoking history Cholesterol well-controlled, diabetes numbers well-controlled Stressed importance of smoking cessation  Coronary calcification Images pulled up and reviewed Denies anginal symptoms, no further workup needed Smoking cessation recommended Cholesterol and diabetes numbers at goal  Low blood pressure left arm Suspected left  subclavian stenosis in the setting of long smoking history We have ordered carotid ultrasound  Hyperlipidemia Cholesterol is at goal on the current lipid regimen. No changes to the medications were made.  Diabetes type 2 A1c well-controlled Strict diet recommended  Smoking We have encouraged him to continue to work on weaning his cigarettes and smoking cessation. He will continue to work on this and does not want any assistance with chantix.      Signed, Dossie Arbour, M.D., Ph.D. [facility] [place], [place] [phone]

## 2023-08-30 ENCOUNTER — Encounter: Payer: Self-pay | Admitting: Cardiovascular Disease

## 2023-08-30 ENCOUNTER — Ambulatory Visit: Payer: Medicare HMO | Attending: Internal Medicine | Admitting: Cardiovascular Disease

## 2023-08-30 VITALS — BP 80/60 | HR 67 | Ht 65.0 in | Wt 147.2 lb

## 2023-08-30 DIAGNOSIS — F17200 Nicotine dependence, unspecified, uncomplicated: Secondary | ICD-10-CM | POA: Diagnosis not present

## 2023-08-30 DIAGNOSIS — I708 Atherosclerosis of other arteries: Secondary | ICD-10-CM

## 2023-08-30 DIAGNOSIS — I2510 Atherosclerotic heart disease of native coronary artery without angina pectoris: Secondary | ICD-10-CM

## 2023-08-30 DIAGNOSIS — E1159 Type 2 diabetes mellitus with other circulatory complications: Secondary | ICD-10-CM

## 2023-08-30 DIAGNOSIS — I700 Atherosclerosis of aorta: Secondary | ICD-10-CM

## 2023-08-30 NOTE — Patient Instructions (Signed)
Medication Instructions:  No changes  If you need a refill on your cardiac medications before your next appointment, please call your pharmacy.   Lab work: No new labs needed  Testing/Procedures: Carotid u/s for left subclavian stenosis, drop in blood pressure left arm  Follow-Up: At [facility], you and your health needs are our priority.  As part of our continuing mission to provide you with exceptional heart care, we have created designated Provider Care Teams.  These Care Teams include your primary Cardiologist (physician) and Advanced Practice Providers (APPs -  Physician Assistants and Nurse Practitioners) who all work together to provide you with the care you need, when you need it.  You will need a follow up appointment in 12 months  Providers on your designated Care Team:   Nicolasa Ducking, NP Eula Listen, PA-C Cadence Fransico Michael, [place]  COVID-19 Vaccine Information can be found at: [email or web address] For questions related to vaccine distribution or appointments, please email [email or web address] or call [phone].

## 2023-09-12 ENCOUNTER — Ambulatory Visit: Payer: Medicare HMO | Admitting: Physician Assistant

## 2023-09-16 ENCOUNTER — Telehealth: Payer: Self-pay | Admitting: Cardiovascular Disease

## 2023-09-16 NOTE — Telephone Encounter (Signed)
 Returned the call to patient. He has been advised as to why the carotid doppler was ordered and that it was scheduled at his last office visit. Nothing further needed.

## 2023-09-16 NOTE — Telephone Encounter (Signed)
 Patient has questions regarding 1/10 carotid. He states he does not remember scheduling it or discussing it during his appointment with Dr. Mariah Milling. Please advise.

## 2023-09-23 ENCOUNTER — Ambulatory Visit: Payer: Medicare HMO | Attending: Cardiovascular Disease

## 2023-09-23 DIAGNOSIS — I2510 Atherosclerotic heart disease of native coronary artery without angina pectoris: Secondary | ICD-10-CM

## 2023-09-23 DIAGNOSIS — I708 Atherosclerosis of other arteries: Secondary | ICD-10-CM

## 2023-09-26 ENCOUNTER — Other Ambulatory Visit: Payer: Self-pay | Admitting: Emergency Medicine

## 2023-09-26 DIAGNOSIS — I708 Atherosclerosis of other arteries: Secondary | ICD-10-CM

## 2023-09-27 ENCOUNTER — Telehealth: Payer: Self-pay | Admitting: Cardiovascular Disease

## 2023-09-27 NOTE — Telephone Encounter (Signed)
 Pt needs to be scheduled for VAS [place] UPPER Extremity Arterial Duplex.

## 2023-10-07 ENCOUNTER — Other Ambulatory Visit: Payer: Self-pay | Admitting: Emergency Medicine

## 2023-10-07 ENCOUNTER — Ambulatory Visit: Payer: Medicare HMO | Attending: Cardiovascular Disease

## 2023-10-07 DIAGNOSIS — I708 Atherosclerosis of other arteries: Secondary | ICD-10-CM

## 2023-11-03 ENCOUNTER — Other Ambulatory Visit: Payer: Self-pay | Admitting: Primary Care

## 2023-11-03 DIAGNOSIS — J449 Chronic obstructive pulmonary disease, unspecified: Secondary | ICD-10-CM

## 2023-11-03 DIAGNOSIS — E785 Hyperlipidemia, unspecified: Secondary | ICD-10-CM

## 2023-11-03 MED ORDER — ALBUTEROL SULFATE HFA 108 (90 BASE) MCG/ACT IN AERS: 1.0000 | INHALATION_SPRAY | Freq: Four times a day (QID) | RESPIRATORY_TRACT | 0 refills | Status: DC | PRN

## 2023-11-03 NOTE — Telephone Encounter (Signed)
 Patient is due for CPE/follow up in late March, this will be required prior to any further refills.  Please schedule, thank you!

## 2023-11-03 NOTE — Telephone Encounter (Signed)
 Patient has been scheduled. He stated that he is needing albuterol (VENTOLIN HFA) 108 (90 Base) MCG/ACT inhaler refilled as well.

## 2023-11-03 NOTE — Telephone Encounter (Signed)
Noted.  Refill sent to pharmacy. 

## 2023-11-07 ENCOUNTER — Encounter (INDEPENDENT_AMBULATORY_CARE_PROVIDER_SITE_OTHER): Payer: Self-pay | Admitting: Nurse Practitioner

## 2023-11-07 ENCOUNTER — Ambulatory Visit (INDEPENDENT_AMBULATORY_CARE_PROVIDER_SITE_OTHER): Payer: Medicare HMO | Admitting: Nurse Practitioner

## 2023-11-07 VITALS — BP 136/73 | HR 78 | Resp 16 | Wt 148.2 lb

## 2023-11-07 DIAGNOSIS — I771 Stricture of artery: Secondary | ICD-10-CM | POA: Diagnosis not present

## 2023-11-07 DIAGNOSIS — F17200 Nicotine dependence, unspecified, uncomplicated: Secondary | ICD-10-CM

## 2023-11-07 DIAGNOSIS — E1159 Type 2 diabetes mellitus with other circulatory complications: Secondary | ICD-10-CM | POA: Diagnosis not present

## 2023-11-07 NOTE — Progress Notes (Signed)
 Subjective:    Patient ID: Roy Nine., male    DOB: 27-Mar-1961, 63 y.o.   MRN: [id number] Chief Complaint  Patient presents with   New Patient (Initial Visit)    Ref Gollan consult left subclavian artery occlusion     Roy Richards is a 63 year old male who presents today for evaluation of left subclavian stenosis.  He notes that this was initially prompted by observation of abnormal blood pressures in his upper extremities by his cardiologist.  He notes that this is actually not a new development but he has had abnormal blood pressures in his upper extremities for at least the last 10 years.    Patient denies pain in the upper extremities.  There are no changes associated with activity, no upper extremity claudication symptoms.  No history of ulcerations or embolic events.  The patient denies vertigo or dizziness.  No changes with looking upward.  The patient denies rest pain or dangling of the arm for relief. No open wounds or sores of the arms have developed at this point in time. No prior vascular interventions or surgeries.  No history of neck problems or DJD of the cervical spine.   The patient denies claudication symptoms or rest pain symptoms of the lower extremities.  No new ulcers or wounds of the foot.  The patient's blood pressure has been stable and relatively well controlled. No documented history of amaurosis fugax or recent TIA symptoms. There are no recent neurological changes noted.  Carotid duplex done on 09/23/2023 shows 1 to 39% stenosis of the bilateral internal carotid arteries.  There is antegrade flow in the right vertebral artery with retrograde flow in the left vertebral.  Additional studies on 10/07/2023 shows monophasic waveforms throughout the left upper extremity suggestive of a left subclavian artery stenosis.     Review of Systems  Neurological:  Negative for dizziness, syncope and weakness.  All other systems reviewed and are negative.       Objective:   Physical Exam Vitals reviewed.  Cardiovascular:     Rate and Rhythm: Normal rate.     Pulses:          Radial pulses are 2+ on the right side and 0 on the left side.  Pulmonary:     Effort: Pulmonary effort is normal.  Skin:    General: Skin is warm and dry.  Neurological:     Mental Status: He is alert and oriented to person, place, and time.  Psychiatric:        Mood and Affect: Mood normal.        Behavior: Behavior normal.        Thought Content: Thought content normal.        Judgment: Judgment normal.     BP 136/73 Comment: right arm  Pulse 78   Resp 16   Wt 148 lb 3.2 oz (67.2 kg)   BMI 24.66 kg/m   Past Medical History:  Diagnosis Date   Acute colitis 06/26/2019   Attention deficit hyperactivity disorder, combined type 09/12/2019   Bipolar II disorder (HCC) 09/12/2019   Cocaine dependence in remission (HCC) 09/12/2019   Diabetes mellitus without complication (HCC)    Diverticular disease of colon 06/17/2023   History of colon polyps 06/17/2023   Hyperlipidemia    Hypertension    Hypoxia 05/07/2020   Uncomplicated alcohol dependence (HCC) 09/12/2019    Social History   Socioeconomic History   Marital status: Divorced    Spouse name:  Not on file   Number of children: Not on file   Years of education: Not on file   Highest education level: Not on file  Occupational History   Not on file  Tobacco Use   Smoking status: Some Days    Current packs/day: 0.50    Average packs/day: 0.5 packs/day for 25.4 years (12.7 ttl pk-yrs)    Types: Cigarettes    Start date: 12/15/2022   Smokeless tobacco: Never  Vaping Use   Vaping status: Never Used  Substance and Sexual Activity   Alcohol use: Yes    Comment: occasionally   Drug use: Never   Sexual activity: Yes  Other Topics Concern   Not on file  Social History Narrative   Not on file   Social Drivers of Health   Financial Resource Strain: Low Risk  (01/05/2023)   Overall Financial  Resource Strain (CARDIA)    Difficulty of Paying Living Expenses: Not hard at all  Food Insecurity: No Food Insecurity (01/05/2023)   Hunger Vital Sign    Worried About Running Out of Food in the Last Year: Never true    Ran Out of Food in the Last Year: Never true  Transportation Needs: No Transportation Needs (01/05/2023)   PRAPARE - Administrator, Civil Service (Medical): No    Lack of Transportation (Non-Medical): No  Physical Activity: Inactive (01/05/2023)   Exercise Vital Sign    Days of Exercise per Week: 0 days    Minutes of Exercise per Session: 0 min  Stress: No Stress Concern Present (01/05/2023)   [employer] of Occupational Health - Occupational Stress Questionnaire    Feeling of Stress : Not at all  Social Connections: Socially Isolated (01/05/2023)   Social Connection and Isolation Panel [NHANES]    Frequency of Communication with Friends and Family: More than three times a week    Frequency of Social Gatherings with Friends and Family: More than three times a week    Attends Religious Services: Never    Database administrator or Organizations: No    Attends Banker Meetings: Never    Marital Status: Divorced  Catering manager Violence: Not At Risk (01/05/2023)   Humiliation, Afraid, Rape, and Kick questionnaire    Fear of Current or Ex-Partner: No    Emotionally Abused: No    Physically Abused: No    Sexually Abused: No    Past Surgical History:  Procedure Laterality Date   BIOPSY  08/09/2023   Procedure: BIOPSY;  Surgeon: Wyline Mood, MD;  Location: [facility] ENDOSCOPY;  Service: Gastroenterology;;   COLONOSCOPY WITH PROPOFOL N/A 08/09/2023   Procedure: COLONOSCOPY WITH PROPOFOL;  Surgeon: Wyline Mood, MD;  Location: [facility] ENDOSCOPY;  Service: Gastroenterology;  Laterality: N/A;   HERNIA REPAIR     POLYPECTOMY  08/09/2023   Procedure: POLYPECTOMY;  Surgeon: Wyline Mood, MD;  Location: [facility] ENDOSCOPY;  Service: Gastroenterology;;     Family History  Problem Relation Age of Onset   COPD Mother    Heart attack Father 67   Heart disease Father    Hyperlipidemia Father    Hypertension Father     No Known Allergies     Latest Ref Rng & Units 06/13/2023    9:47 AM 05/31/2023    9:08 AM 10/15/2022    3:35 AM  CBC  WBC 4.0 - 10.5 K/uL 9.4  12.4  15.8   Hemoglobin 13.0 - 17.0 g/dL 16.1  09.6  04.5   Hematocrit  39.0 - 52.0 % 44.6  45.3  40.6   Platelets 150.0 - 400.0 K/uL 267.0  222.0  234       CMP     Component Value Date/Time   NA 139 05/31/2023 0908   K 4.2 05/31/2023 0908   CL 98 05/31/2023 0908   CO2 35 (H) 05/31/2023 0908   GLUCOSE 90 05/31/2023 0908   BUN 12 05/31/2023 0908   CREATININE 0.72 05/31/2023 0908   CREATININE 1.16 05/07/2020 1606   CALCIUM 8.9 05/31/2023 0908   PROT 6.4 11/30/2022 0922   ALBUMIN 4.0 11/30/2022 0922   AST 15 11/30/2022 0922   ALT 17 11/30/2022 0922   ALKPHOS 95 11/30/2022 0922   BILITOT 0.5 11/30/2022 0922   GFR 96.92 05/31/2023 0908   GFRNONAA >60 10/15/2022 0335     No results found.     Assessment & Plan:   1. Subclavian arterial stenosis (HCC) (Primary) In discussion with the patient I suspect that this subclavian stenosis is chronic in nature and has likely been going on for the last 10 years.  He denies any symptoms currently.  Based on that we do not find any evidence of acute arterial compromise.  We discussed options for treatment of subclavian stenosis including angiogram versus careful observation.  At this time the patient denies any significant symptoms associated with subclavian stenosis.  Following discussion the patient elects to move forward with observation.  He is advised that if he begins to experience symptoms such as dizziness, syncope, pain or weakness of his upper extremities that he should follow-up with [place] sooner.  We will continue to monitor and have him follow-up in 1 year per patient preference.  He should also continue with  statin.  2. Tobacco dependence with current use Smoking cessation was discussed, 3-10 minutes spent on this topic specifically  3. Type 2 diabetes mellitus with other circulatory complication, without long-term current use of insulin (HCC) Continue hypoglycemic medications as already ordered, these medications have been reviewed and there are no changes at this time.  Hgb A1C to be monitored as already arranged by primary service   Current Outpatient Medications on File Prior to Visit  Medication Sig Dispense Refill   albuterol (VENTOLIN HFA) 108 (90 Base) MCG/ACT inhaler Inhale 1-2 puffs into the lungs every 6 (six) hours as needed for wheezing or shortness of breath. 18 g 0   amLODipine (NORVASC) 10 MG tablet TAKE ONE TABLET BY MOUTH DAILY FOR BLOOD PRESSURE 90 tablet 3   atorvastatin (LIPITOR) 40 MG tablet TAKE 1 TABLET BY MOUTH DAILY FOR CHOLESTEROL 90 tablet 0   Cholecalciferol (VITAMIN D) 50 MCG (2000 UT) tablet Take 2,000 Units by mouth daily.     citalopram (CELEXA) 40 MG tablet TAKE 1 TABLET BY MOUTH DAILY FOR ANXIETY 90 tablet 2   fluticasone-salmeterol (ADVAIR DISKUS) 250-50 MCG/ACT AEPB Inhale 1 puff into the lungs in the morning and at bedtime. 60 each 11   losartan (COZAAR) 50 MG tablet Take 1 tablet (50 mg total) by mouth daily. for blood pressure. 90 tablet 1   metFORMIN (GLUCOPHAGE-XR) 500 MG 24 hr tablet Take 500 mg by mouth daily with breakfast.     Multiple Vitamins-Minerals (MULTIVITAMIN ADULT PO) Take by mouth.     sildenafil (VIAGRA) 100 MG tablet Take 1/2 to 1 tablet by mouth 30 min prior to sexual activity 30 tablet 0   No current facility-administered medications on file prior to visit.    There are no Patient  Instructions on file for this visit. No follow-ups on file.   Georgiana Spinner, NP

## 2023-11-24 ENCOUNTER — Other Ambulatory Visit: Payer: Self-pay | Admitting: Primary Care

## 2023-11-24 DIAGNOSIS — E1159 Type 2 diabetes mellitus with other circulatory complications: Secondary | ICD-10-CM

## 2023-11-24 DIAGNOSIS — I10 Essential (primary) hypertension: Secondary | ICD-10-CM

## 2023-11-29 ENCOUNTER — Other Ambulatory Visit: Payer: Self-pay | Admitting: Primary Care

## 2023-11-29 DIAGNOSIS — J449 Chronic obstructive pulmonary disease, unspecified: Secondary | ICD-10-CM

## 2023-12-16 ENCOUNTER — Encounter: Payer: Self-pay | Admitting: Primary Care

## 2023-12-16 ENCOUNTER — Ambulatory Visit: Payer: Medicare HMO | Admitting: Primary Care

## 2023-12-16 VITALS — BP 122/60 | HR 70 | Temp 97.9°F | Ht 64.0 in | Wt 146.0 lb

## 2023-12-16 DIAGNOSIS — H6123 Impacted cerumen, bilateral: Secondary | ICD-10-CM

## 2023-12-16 DIAGNOSIS — Z125 Encounter for screening for malignant neoplasm of prostate: Secondary | ICD-10-CM | POA: Diagnosis not present

## 2023-12-16 DIAGNOSIS — Z0000 Encounter for general adult medical examination without abnormal findings: Secondary | ICD-10-CM

## 2023-12-16 DIAGNOSIS — E1159 Type 2 diabetes mellitus with other circulatory complications: Secondary | ICD-10-CM | POA: Diagnosis not present

## 2023-12-16 DIAGNOSIS — J449 Chronic obstructive pulmonary disease, unspecified: Secondary | ICD-10-CM

## 2023-12-16 DIAGNOSIS — I700 Atherosclerosis of aorta: Secondary | ICD-10-CM

## 2023-12-16 DIAGNOSIS — Z0001 Encounter for general adult medical examination with abnormal findings: Secondary | ICD-10-CM | POA: Diagnosis not present

## 2023-12-16 DIAGNOSIS — N529 Male erectile dysfunction, unspecified: Secondary | ICD-10-CM

## 2023-12-16 DIAGNOSIS — I10 Essential (primary) hypertension: Secondary | ICD-10-CM | POA: Diagnosis not present

## 2023-12-16 DIAGNOSIS — Z7984 Long term (current) use of oral hypoglycemic drugs: Secondary | ICD-10-CM | POA: Diagnosis not present

## 2023-12-16 DIAGNOSIS — I2510 Atherosclerotic heart disease of native coronary artery without angina pectoris: Secondary | ICD-10-CM | POA: Diagnosis not present

## 2023-12-16 DIAGNOSIS — F17200 Nicotine dependence, unspecified, uncomplicated: Secondary | ICD-10-CM

## 2023-12-16 DIAGNOSIS — F32A Depression, unspecified: Secondary | ICD-10-CM

## 2023-12-16 LAB — COMPREHENSIVE METABOLIC PANEL WITH GFR
ALT: 19 U/L (ref 0–53)
AST: 19 U/L (ref 0–37)
Albumin: 4.6 g/dL (ref 3.5–5.2)
Alkaline Phosphatase: 89 U/L (ref 39–117)
BUN: 13 mg/dL (ref 6–23)
CO2: 32 meq/L (ref 19–32)
Calcium: 9.1 mg/dL (ref 8.4–10.5)
Chloride: 103 meq/L (ref 96–112)
Creatinine, Ser: 0.83 mg/dL (ref 0.40–1.50)
GFR: 92.49 mL/min (ref >60.00)
Glucose, Bld: 98 mg/dL (ref 70–99)
Potassium: 4.1 meq/L (ref 3.5–5.1)
Sodium: 142 meq/L (ref 135–145)
Total Bilirubin: 0.7 mg/dL (ref 0.2–1.2)
Total Protein: 6.7 g/dL (ref 6.0–8.3)

## 2023-12-16 LAB — CBC
HCT: 44.7 % (ref 39.0–52.0)
Hemoglobin: 14.9 g/dL (ref 13.0–17.0)
MCHC: 33.4 g/dL (ref 30.0–36.0)
MCV: 93.5 fl (ref 78.0–100.0)
Platelets: 196 10*3/uL (ref 150.0–400.0)
RBC: 4.78 Mil/uL (ref 4.22–5.81)
RDW: 14.2 % (ref 11.5–15.5)
WBC: 7.8 10*3/uL (ref 4.0–10.5)

## 2023-12-16 LAB — LIPID PANEL
Cholesterol: 82 mg/dL (ref 0–200)
HDL: 43.7 mg/dL (ref >39.00)
LDL Cholesterol: 28 mg/dL (ref 0–99)
NonHDL: 37.9
Total CHOL/HDL Ratio: 2
Triglycerides: 50 mg/dL (ref 0.0–149.0)
VLDL: 10 mg/dL (ref 0.0–40.0)

## 2023-12-16 LAB — PSA, MEDICARE: PSA: 0.29 ng/mL (ref 0.10–4.00)

## 2023-12-16 LAB — HEMOGLOBIN A1C: Hgb A1c MFr Bld: 6.3 % (ref 4.6–6.5)

## 2023-12-16 MED ORDER — PREDNISONE 20 MG PO TABS
ORAL_TABLET | ORAL | 0 refills | Status: DC
Start: 2023-12-16 — End: 2024-05-15

## 2023-12-16 NOTE — Assessment & Plan Note (Signed)
 Slightly deteriorated given pollen season.  Continue Advair 250-50 mcg, 1 puff BID Continue albuterol inhaler PRN

## 2023-12-16 NOTE — Assessment & Plan Note (Signed)
 Controlled. Continue losartan 50 mg daily, amlodipine 10 mg daily.   CMP pending.

## 2023-12-16 NOTE — Assessment & Plan Note (Signed)
 Stable.  Continue Viagra 50-100 mg PRN

## 2023-12-16 NOTE — Assessment & Plan Note (Signed)
 Lung cancer screening UTD. Continue annual screenings.

## 2023-12-16 NOTE — Addendum Note (Signed)
 Addended by: [name] on: 12/16/2023 08:31 AM   Modules accepted: Orders

## 2023-12-16 NOTE — Assessment & Plan Note (Signed)
 Bilateral cerumen impaction identified on exam. Patient consented to irrigation of canals bilaterally.  Bilateral canals irrigated. Patient tolerated well. TM's and canals post irrigation unremarkable.   Discussed home care instructions.

## 2023-12-16 NOTE — Progress Notes (Addendum)
 Subjective:    Patient ID: Roy Richards., male    DOB: 02/08/1961, 63 y.o.   MRN: [id number]  HPI  Roy Richards. is a very pleasant 63 y.o. male who presents today for complete physical and follow up of chronic conditions.  Immunizations: -Tetanus: Completed in 2022 -Shingles: Never completed  -Pneumonia: Completed pneumovax in 2022, declines to update   Diet: Fair diet.  Exercise: No regular exercise.  Eye exam: Completes >1 year ago  Dental exam: Completes semi-annually    Colonoscopy: Completed in 2024, due 2027 Lung Cancer Screening: Completed in 2024  PSA: Due  BP Readings from Last 3 Encounters:  12/16/23 122/60  11/07/23 136/73  08/30/23 (!) 80/60          Review of Systems  Constitutional:  Negative for unexpected weight change.  HENT:  Negative for rhinorrhea.   Respiratory:  Positive for shortness of breath. Negative for cough.   Cardiovascular:  Negative for chest pain.  Gastrointestinal:  Negative for constipation and diarrhea.  Genitourinary:  Negative for difficulty urinating.  Musculoskeletal:  Positive for arthralgias.  Skin:  Negative for rash.  Allergic/Immunologic: Negative for environmental allergies.  Neurological:  Negative for dizziness, numbness and headaches.  Psychiatric/Behavioral:  The patient is not nervous/anxious.          Past Medical History:  Diagnosis Date   Acute colitis 06/26/2019   Attention deficit hyperactivity disorder, combined type 09/12/2019   Bipolar II disorder (HCC) 09/12/2019   Cocaine dependence in remission (HCC) 09/12/2019   Contusion of abdominal wall 06/13/2023   Diabetes mellitus without complication (HCC)    Diverticular disease of colon 06/17/2023   History of colon polyps 06/17/2023   Hyperlipidemia    Hypertension    Hypoxia 05/07/2020   Uncomplicated alcohol dependence (HCC) 09/12/2019    Social History   Socioeconomic History   Marital status: Divorced    Spouse name: Not on file    Number of children: Not on file   Years of education: Not on file   Highest education level: GED or equivalent  Occupational History   Not on file  Tobacco Use   Smoking status: Some Days    Current packs/day: 0.50    Average packs/day: 0.5 packs/day for 25.5 years (12.8 ttl pk-yrs)    Types: Cigarettes    Start date: 12/15/2022   Smokeless tobacco: Never  Vaping Use   Vaping status: Never Used  Substance and Sexual Activity   Alcohol use: Yes    Comment: occasionally   Drug use: Never   Sexual activity: Yes  Other Topics Concern   Not on file  Social History Narrative   Not on file   Social Drivers of Health   Financial Resource Strain: Medium Risk (12/12/2023)   Overall Financial Resource Strain (CARDIA)    Difficulty of Paying Living Expenses: Somewhat hard  Food Insecurity: Food Insecurity Present (12/12/2023)   Hunger Vital Sign    Worried About Running Out of Food in the Last Year: Sometimes true    Ran Out of Food in the Last Year: Sometimes true  Transportation Needs: No Transportation Needs (12/12/2023)   PRAPARE - Administrator, Civil Service (Medical): No    Lack of Transportation (Non-Medical): No  Physical Activity: Insufficiently Active (12/12/2023)   Exercise Vital Sign    Days of Exercise per Week: 4 days    Minutes of Exercise per Session: 20 min  Stress: No Stress Concern Present (12/12/2023)  [employer] of Occupational Health - Occupational Stress Questionnaire    Feeling of Stress : Only a little  Social Connections: Socially Isolated (12/12/2023)   Social Connection and Isolation Panel [NHANES]    Frequency of Communication with Friends and Family: More than three times a week    Frequency of Social Gatherings with Friends and Family: Twice a week    Attends Religious Services: Never    Database administrator or Organizations: No    Attends Banker Meetings: Never    Marital Status: Divorced  Catering manager  Violence: Not At Risk (01/05/2023)   Humiliation, Afraid, Rape, and Kick questionnaire    Fear of Current or Ex-Partner: No    Emotionally Abused: No    Physically Abused: No    Sexually Abused: No    Past Surgical History:  Procedure Laterality Date   BIOPSY  08/09/2023   Procedure: BIOPSY;  Surgeon: Wyline Mood, MD;  Location: [facility] ENDOSCOPY;  Service: Gastroenterology;;   COLONOSCOPY WITH PROPOFOL N/A 08/09/2023   Procedure: COLONOSCOPY WITH PROPOFOL;  Surgeon: Wyline Mood, MD;  Location: [facility] ENDOSCOPY;  Service: Gastroenterology;  Laterality: N/A;   HERNIA REPAIR     POLYPECTOMY  08/09/2023   Procedure: POLYPECTOMY;  Surgeon: Wyline Mood, MD;  Location: [facility] ENDOSCOPY;  Service: Gastroenterology;;    Family History  Problem Relation Age of Onset   COPD Mother    Heart attack Father 4   Heart disease Father    Hyperlipidemia Father    Hypertension Father     No Known Allergies  Current Outpatient Medications on File Prior to Visit  Medication Sig Dispense Refill   albuterol (VENTOLIN HFA) 108 (90 Base) MCG/ACT inhaler Inhale 1-2 puffs into the lungs every 6 (six) hours as needed for wheezing or shortness of breath. 18 g 0   amLODipine (NORVASC) 10 MG tablet TAKE ONE TABLET BY MOUTH DAILY FOR BLOOD PRESSURE 90 tablet 3   atorvastatin (LIPITOR) 40 MG tablet TAKE 1 TABLET BY MOUTH DAILY FOR CHOLESTEROL 90 tablet 0   Cholecalciferol (VITAMIN D) 50 MCG (2000 UT) tablet Take 2,000 Units by mouth daily.     citalopram (CELEXA) 40 MG tablet TAKE 1 TABLET BY MOUTH DAILY FOR ANXIETY 90 tablet 2   fluticasone-salmeterol (ADVAIR) 250-50 MCG/ACT AEPB Inhale 1 puff into the lungs every morning and evening. 180 each 0   losartan (COZAAR) 50 MG tablet TAKE 1 TABLET BY MOUTH DAILY FOR BLOOD PRESSURE 90 tablet 0   metFORMIN (GLUCOPHAGE-XR) 500 MG 24 hr tablet Take 1 tablet (500 mg total) by mouth daily with breakfast. for diabetes. 90 tablet 0   Multiple Vitamins-Minerals (MULTIVITAMIN  ADULT PO) Take by mouth.     sildenafil (VIAGRA) 100 MG tablet Take 1/2 to 1 tablet by mouth 30 min prior to sexual activity 30 tablet 0   No current facility-administered medications on file prior to visit.    BP 122/60 (BP Location: Right Arm, Patient Position: Sitting, Cuff Size: Normal)   Pulse 70   Temp 97.9 F (36.6 C)   Ht 5\' 4"  (1.626 m)   Wt 146 lb (66.2 kg)   SpO2 92%   BMI 25.06 kg/m  Objective:   Physical Exam HENT:     Right Ear: Tympanic membrane and ear canal normal.     Left Ear: Tympanic membrane and ear canal normal.  Eyes:     Pupils: Pupils are equal, round, and reactive to light.  Cardiovascular:     Rate  and Rhythm: Normal rate and regular rhythm.  Pulmonary:     Effort: Pulmonary effort is normal.     Breath sounds: Examination of the right-upper field reveals wheezing. Examination of the left-upper field reveals wheezing. Wheezing present.  Abdominal:     General: Bowel sounds are normal.     Palpations: Abdomen is soft.     Tenderness: There is no abdominal tenderness.  Musculoskeletal:        General: Normal range of motion.     Cervical back: Neck supple.  Skin:    General: Skin is warm and dry.  Neurological:     Mental Status: He is alert and oriented to person, place, and time.     Cranial Nerves: No cranial nerve deficit.     Deep Tendon Reflexes:     Reflex Scores:      Patellar reflexes are 2+ on the right side and 2+ on the left side. Psychiatric:        Mood and Affect: Mood normal.           Assessment & Plan:  Preventative health care Assessment & Plan: Declines Shingrix and pneumonia vaccine.  Colonoscopy UTD, due 2027 PSA due and pending.  Discussed the importance of a healthy diet and regular exercise in order for weight loss, and to reduce the risk of further co-morbidity.  Exam stable. Labs pending.  Follow up in 1 year for repeat physical.    Aortic atherosclerosis (HCC) Assessment & Plan: Repeat lipid  panel pending. Reviewed cardiology notes from December 2024.  Continue atorvastatin 40 mg daily.    Coronary artery disease involving native coronary artery of native heart without angina pectoris Assessment & Plan: Asymptomatic. Reviewed cardiology notes from December 2024.   Continue atorvastatin 40 mg daily.   Chronic obstructive pulmonary disease, unspecified COPD type (HCC) Assessment & Plan: Slightly deteriorated given pollen season.  Continue Advair 250-50 mcg, 1 puff BID Continue albuterol inhaler PRN   Type 2 diabetes mellitus with other circulatory complication, without long-term current use of insulin (HCC) Assessment & Plan: Repeat A1C pending.  Continue metformin ER 500 mg daily. Follow up in 3-6 months based on A1C result.   Orders: -     Hemoglobin A1c  Depressive disorder Assessment & Plan: Controlled.  Continue citalopram 40 mg daily.    Erectile dysfunction, unspecified erectile dysfunction type Assessment & Plan: Stable.  Continue Viagra 50-100 mg PRN   Bilateral impacted cerumen Assessment & Plan: Bilateral cerumen impaction identified on exam. Patient consented to irrigation of canals bilaterally.  Bilateral canals irrigated. Patient tolerated well. TM's and canals post irrigation unremarkable.   Discussed home care instructions.     Tobacco dependence with current use Assessment & Plan: Lung cancer screening UTD. Continue annual screenings.    Essential hypertension Assessment & Plan: Controlled. Continue losartan 50 mg daily, amlodipine 10 mg daily.   CMP pending.  Orders: -     Comprehensive metabolic panel with GFR -     CBC -     Lipid panel  Screening for prostate cancer -     PSA, Medicare        [name], NP

## 2023-12-16 NOTE — Assessment & Plan Note (Signed)
 Repeat A1C pending.  Continue metformin ER 500 mg daily. Follow up in 3-6 months based on A1C result.

## 2023-12-16 NOTE — Patient Instructions (Signed)
 Stop by the lab prior to leaving today. I will notify you of your results once received.   It was a pleasure to see you today!

## 2023-12-16 NOTE — Assessment & Plan Note (Signed)
 Declines Shingrix and pneumonia vaccine.  Colonoscopy UTD, due 2027 PSA due and pending.  Discussed the importance of a healthy diet and regular exercise in order for weight loss, and to reduce the risk of further co-morbidity.  Exam stable. Labs pending.  Follow up in 1 year for repeat physical.

## 2023-12-16 NOTE — Assessment & Plan Note (Signed)
 Asymptomatic. Reviewed cardiology notes from December 2024.   Continue atorvastatin 40 mg daily.

## 2023-12-16 NOTE — Assessment & Plan Note (Signed)
Controlled.   Continue citalopram 40 mg daily.  

## 2023-12-16 NOTE — Assessment & Plan Note (Signed)
 Repeat lipid panel pending. Reviewed cardiology notes from December 2024.  Continue atorvastatin 40 mg daily.

## 2024-01-01 ENCOUNTER — Other Ambulatory Visit: Payer: Self-pay | Admitting: Primary Care

## 2024-01-01 DIAGNOSIS — I10 Essential (primary) hypertension: Secondary | ICD-10-CM

## 2024-01-16 ENCOUNTER — Other Ambulatory Visit: Payer: Self-pay | Admitting: Primary Care

## 2024-01-16 DIAGNOSIS — J449 Chronic obstructive pulmonary disease, unspecified: Secondary | ICD-10-CM

## 2024-01-16 MED ORDER — ALBUTEROL SULFATE HFA 108 (90 BASE) MCG/ACT IN AERS: 1.0000 | INHALATION_SPRAY | Freq: Four times a day (QID) | RESPIRATORY_TRACT | 0 refills | Status: DC | PRN

## 2024-01-25 DIAGNOSIS — F419 Anxiety disorder, unspecified: Secondary | ICD-10-CM | POA: Diagnosis not present

## 2024-01-25 DIAGNOSIS — I700 Atherosclerosis of aorta: Secondary | ICD-10-CM | POA: Diagnosis not present

## 2024-01-25 DIAGNOSIS — F329 Major depressive disorder, single episode, unspecified: Secondary | ICD-10-CM | POA: Diagnosis not present

## 2024-01-25 DIAGNOSIS — Z7951 Long term (current) use of inhaled steroids: Secondary | ICD-10-CM | POA: Diagnosis not present

## 2024-01-25 DIAGNOSIS — Z008 Encounter for other general examination: Secondary | ICD-10-CM | POA: Diagnosis not present

## 2024-01-25 DIAGNOSIS — I771 Stricture of artery: Secondary | ICD-10-CM | POA: Diagnosis not present

## 2024-01-25 DIAGNOSIS — I2510 Atherosclerotic heart disease of native coronary artery without angina pectoris: Secondary | ICD-10-CM | POA: Diagnosis not present

## 2024-01-25 DIAGNOSIS — Z833 Family history of diabetes mellitus: Secondary | ICD-10-CM | POA: Diagnosis not present

## 2024-01-25 DIAGNOSIS — Z823 Family history of stroke: Secondary | ICD-10-CM | POA: Diagnosis not present

## 2024-01-25 DIAGNOSIS — J449 Chronic obstructive pulmonary disease, unspecified: Secondary | ICD-10-CM | POA: Diagnosis not present

## 2024-01-25 DIAGNOSIS — E785 Hyperlipidemia, unspecified: Secondary | ICD-10-CM | POA: Diagnosis not present

## 2024-01-25 DIAGNOSIS — I10 Essential (primary) hypertension: Secondary | ICD-10-CM | POA: Diagnosis not present

## 2024-01-25 DIAGNOSIS — Z7984 Long term (current) use of oral hypoglycemic drugs: Secondary | ICD-10-CM | POA: Diagnosis not present

## 2024-01-28 ENCOUNTER — Other Ambulatory Visit: Payer: Self-pay | Admitting: Primary Care

## 2024-01-28 DIAGNOSIS — E785 Hyperlipidemia, unspecified: Secondary | ICD-10-CM

## 2024-01-31 ENCOUNTER — Encounter (INDEPENDENT_AMBULATORY_CARE_PROVIDER_SITE_OTHER): Payer: Self-pay

## 2024-02-02 ENCOUNTER — Ambulatory Visit (INDEPENDENT_AMBULATORY_CARE_PROVIDER_SITE_OTHER)

## 2024-02-02 VITALS — Ht 64.0 in | Wt 140.0 lb

## 2024-02-02 DIAGNOSIS — Z0000 Encounter for general adult medical examination without abnormal findings: Secondary | ICD-10-CM

## 2024-02-02 NOTE — Progress Notes (Signed)
 Subjective:   Roy F Rudnicki Jr. is a 63 y.o. who presents for a Medicare Wellness preventive visit.  As a reminder, Annual Wellness Visits don't include a physical exam, and some assessments may be limited, especially if this visit is performed virtually. We may recommend an in-person follow-up visit with your provider if needed.  Visit Complete: Virtual I connected with  Roy F Mandell Jr. on 02/02/24 by a audio enabled telemedicine application and verified that I am speaking with the correct person using two identifiers.  Patient Location: Home  Provider Location: Office/Clinic  I discussed the limitations of evaluation and management by telemedicine. The patient expressed understanding and agreed to proceed.  Vital Signs: Because this visit was a virtual/telehealth visit, some criteria may be missing or patient reported. Any vitals not documented were not able to be obtained and vitals that have been documented are patient reported.  VideoDeclined- This patient declined Librarian, academic. Therefore the visit was completed with audio only.  Persons Participating in Visit: Patient.  AWV Questionnaire: Yes: Patient Medicare AWV questionnaire was completed by the patient on 02/01/24; I have confirmed that all information answered by patient is correct and no changes since this date.  Cardiac Risk Factors include: advanced age (>1men, >38 women);diabetes mellitus;male gender;hypertension;dyslipidemia;smoking/ tobacco exposure     Objective:     Today's Vitals   02/02/24 0847  Weight: 140 lb (63.5 kg)  Height: 5\' 4"  (1.626 m)   Body mass index is 24.03 kg/m.     02/02/2024    8:58 AM 08/09/2023    7:05 AM 01/05/2023    2:32 PM 10/15/2022    2:43 AM 10/14/2022    2:31 PM 07/27/2022    9:23 AM 06/18/2019    4:21 PM  Advanced Directives  Does Patient Have a Medical Advance Directive? No No No  No No No  Would patient like information on creating a medical  advance directive?   No - Patient declined No - Patient declined   No - Patient declined    Current Medications (verified) Outpatient Encounter Medications as of 02/02/2024  Medication Sig   albuterol  (VENTOLIN  HFA) 108 (90 Base) MCG/ACT inhaler Inhale 1-2 puffs into the lungs every 6 (six) hours as needed for wheezing or shortness of breath.   amLODipine  (NORVASC ) 10 MG tablet TAKE ONE TABLET BY MOUTH DAILY FOR BLOOD PRESSURE   atorvastatin  (LIPITOR) 40 MG tablet TAKE 1 TABLET BY MOUTH DAILY FOR CHOLESTEROL   Cholecalciferol (VITAMIN D) 50 MCG (2000 UT) tablet Take 2,000 Units by mouth daily.   citalopram  (CELEXA ) 40 MG tablet TAKE 1 TABLET BY MOUTH DAILY FOR ANXIETY   fluticasone -salmeterol (ADVAIR) 250-50 MCG/ACT AEPB Inhale 1 puff into the lungs every morning and evening.   losartan  (COZAAR ) 50 MG tablet TAKE 1 TABLET BY MOUTH DAILY FOR BLOOD PRESSURE   metFORMIN  (GLUCOPHAGE -XR) 500 MG 24 hr tablet Take 1 tablet (500 mg total) by mouth daily with breakfast. for diabetes.   Multiple Vitamins-Minerals (MULTIVITAMIN ADULT PO) Take by mouth.   predniSONE  (DELTASONE ) 20 MG tablet Take 2 tablets by mouth once daily in the morning for 5 days.   sildenafil  (VIAGRA ) 100 MG tablet Take 1/2 to 1 tablet by mouth 30 min prior to sexual activity   No facility-administered encounter medications on file as of 02/02/2024.    Allergies (verified) Patient has no known allergies.   History: Past Medical History:  Diagnosis Date   Acute colitis 06/26/2019   Anxiety 2021  Building collapse   Asthma    Attention deficit hyperactivity disorder, combined type 09/12/2019   Bipolar II disorder (HCC) 09/12/2019   Cocaine dependence in remission (HCC) 09/12/2019   Contusion of abdominal wall 06/13/2023   Depression    Diabetes mellitus without complication (HCC)    Diverticular disease of colon 06/17/2023   Emphysema of lung (HCC) 2015   History of colon polyps 06/17/2023   Hyperlipidemia     Hypertension    Hypoxia 05/07/2020   Uncomplicated alcohol dependence (HCC) 09/12/2019   Past Surgical History:  Procedure Laterality Date   BIOPSY  08/09/2023   Procedure: BIOPSY;  Surgeon: Luke Salaam, MD;  Location: [facility] ENDOSCOPY;  Service: Gastroenterology;;   COLONOSCOPY WITH PROPOFOL  N/A 08/09/2023   Procedure: COLONOSCOPY WITH PROPOFOL ;  Surgeon: Luke Salaam, MD;  Location: [facility] ENDOSCOPY;  Service: Gastroenterology;  Laterality: N/A;   HERNIA REPAIR     POLYPECTOMY  08/09/2023   Procedure: POLYPECTOMY;  Surgeon: Luke Salaam, MD;  Location: [facility] ENDOSCOPY;  Service: Gastroenterology;;   Family History  Problem Relation Age of Onset   COPD Mother    Heart attack Father 21   Heart disease Father    Hyperlipidemia Father    Hypertension Father    Social History   Socioeconomic History   Marital status: Divorced    Spouse name: Not on file   Number of children: Not on file   Years of education: Not on file   Highest education level: GED or equivalent  Occupational History   Not on file  Tobacco Use   Smoking status: Some Days    Current packs/day: 0.50    Average packs/day: 0.5 packs/day for 25.6 years (12.8 ttl pk-yrs)    Types: Cigarettes    Start date: 12/15/2022   Smokeless tobacco: Never  Vaping Use   Vaping status: Never Used  Substance and Sexual Activity   Alcohol use: Yes    Comment: occasionally   Drug use: Never   Sexual activity: Yes  Other Topics Concern   Not on file  Social History Narrative   Not on file   Social Drivers of Health   Financial Resource Strain: Low Risk  (02/02/2024)   Overall Financial Resource Strain (CARDIA)    Difficulty of Paying Living Expenses: Not hard at all  Recent Concern: Financial Resource Strain - Medium Risk (12/12/2023)   Overall Financial Resource Strain (CARDIA)    Difficulty of Paying Living Expenses: Somewhat hard  Food Insecurity: No Food Insecurity (02/02/2024)   Hunger Vital Sign    Worried About Running  Out of Food in the Last Year: Never true    Ran Out of Food in the Last Year: Never true  Recent Concern: Food Insecurity - Food Insecurity Present (12/12/2023)   Hunger Vital Sign    Worried About Running Out of Food in the Last Year: Sometimes true    Ran Out of Food in the Last Year: Sometimes true  Transportation Needs: No Transportation Needs (02/02/2024)   PRAPARE - Administrator, Civil Service (Medical): No    Lack of Transportation (Non-Medical): No  Physical Activity: Insufficiently Active (02/02/2024)   Exercise Vital Sign    Days of Exercise per Week: 4 days    Minutes of Exercise per Session: 30 min  Stress: No Stress Concern Present (02/02/2024)   [employer] of Occupational Health - Occupational Stress Questionnaire    Feeling of Stress : Not at all  Social Connections: Socially Isolated (02/02/2024)  Social Advertising account executive [NHANES]    Frequency of Communication with Friends and Family: More than three times a week    Frequency of Social Gatherings with Friends and Family: Once a week    Attends Religious Services: Never    Database administrator or Organizations: No    Attends Engineer, structural: Never    Marital Status: Divorced    Tobacco Counseling Ready to quit: Not Answered Counseling given: Not Answered    Clinical Intake:  Pre-visit preparation completed: Yes  Pain : No/denies pain     BMI - recorded: 24.03 Nutritional Status: BMI of 19-24  Normal Nutritional Risks: None Diabetes: Yes CBG done?: No Did pt. bring in CBG monitor from home?: No  Lab Results  Component Value Date   HGBA1C 6.3 12/16/2023   HGBA1C 6.0 (A) 05/31/2023   HGBA1C 6.2 11/30/2022     How often do you need to have someone help you when you read instructions, pamphlets, or other written materials from your doctor or pharmacy?: 1 - Never  Interpreter Needed?: No  Comments: lives alone Information entered by ::  B.Sara Keys,LPN   Activities of Daily Living     02/01/2024    9:47 AM  In your present state of health, do you have any difficulty performing the following activities:  Hearing? 0  Vision? 0  Difficulty concentrating or making decisions? 0  Walking or climbing stairs? 1  Dressing or bathing? 0  Doing errands, shopping? 0  Preparing Food and eating ? N  Using the Toilet? N  In the past six months, have you accidently leaked urine? N  Do you have problems with loss of bowel control? N  Managing your Medications? N  Managing your Finances? N  Housekeeping or managing your Housekeeping? N    Patient Care Team: Gabriel Amdrew, NP as PCP - General (Internal Medicine) Constancia Delton, MD as PCP - Cardiology (Cardiology) Pa, [facility])  Indicate any recent Medical Services you may have received from other than [facility] providers in the past year (date may be approximate).     Assessment:    This is a routine wellness examination for Stepan.  Hearing/Vision screen Hearing Screening - Comments:: Pt says his hearing is good Vision Screening - Comments:: Pt says his vision is good-has appt w/eye dr   Goals Addressed             This Visit's Progress    Patient Stated       I would like to get my house remodeled       Depression Screen     02/02/2024    8:53 AM 12/16/2023    7:47 AM 06/13/2023    8:29 AM 01/05/2023    2:31 PM 11/30/2022    9:05 AM 10/22/2022   12:19 PM 03/03/2021    7:28 AM  PHQ 2/9 Scores  PHQ - 2 Score 0 0 0 0 0 0 0  PHQ- 9 Score    0 0  0    Fall Risk     02/01/2024    9:47 AM 12/16/2023    7:47 AM 06/13/2023    8:29 AM 05/31/2023    8:03 AM 01/05/2023    2:24 PM  Fall Risk   Falls in the past year? 0 0 0 0 0  Number falls in past yr: 0 0 0 0 0  Injury with Fall? 0 0 0 0 0  Risk for fall  due to : No Fall Risks No Fall Risks No Fall Risks No Fall Risks No Fall Risks  Follow up Education provided;Falls prevention discussed Falls  evaluation completed Falls evaluation completed Falls evaluation completed Falls prevention discussed;Falls evaluation completed    MEDICARE RISK AT HOME:  Medicare Risk at Home Any stairs in or around the home?: (Patient-Rptd) No If so, are there any without handrails?: (Patient-Rptd) No Home free of loose throw rugs in walkways, pet beds, electrical cords, etc?: (Patient-Rptd) Yes Adequate lighting in your home to reduce risk of falls?: (Patient-Rptd) Yes Life alert?: (Patient-Rptd) No Use of a cane, walker or w/c?: (Patient-Rptd) No Grab bars in the bathroom?: (Patient-Rptd) No Shower chair or bench in shower?: (Patient-Rptd) No Elevated toilet seat or a handicapped toilet?: (Patient-Rptd) No  TIMED UP AND GO:  Was the test performed?  No  Cognitive Function: 6CIT completed        02/02/2024    9:00 AM 01/05/2023    2:33 PM  6CIT Screen  What Year? 0 points 0 points  What month? 0 points 0 points  What time? 0 points 0 points  Count back from 20 0 points 0 points  Months in reverse 0 points 0 points  Repeat phrase 6 points 0 points  Total Score 6 points 0 points    Immunizations Immunization History  Administered Date(s) Administered   Influenza,inj,Quad PF,6+ Mos 10/10/2020, 05/21/2021   PFIZER Comirnaty(Gray Top)Covid-19 Tri-Sucrose Vaccine 12/12/2020   PFIZER(Purple Top)SARS-COV-2 Vaccination 06/06/2020, 06/27/2020   Pneumococcal Polysaccharide-23 09/19/2020   Tdap 05/21/2021    Screening Tests Health Maintenance  Topic Date Due   OPHTHALMOLOGY EXAM  02/17/2022   Pneumococcal Vaccine 64-19 Years old (2 of 2 - PCV) 12/15/2024 (Originally 09/19/2021)   INFLUENZA VACCINE  04/13/2024   Diabetic kidney evaluation - Urine ACR  05/30/2024   FOOT EXAM  05/30/2024   HEMOGLOBIN A1C  06/16/2024   Diabetic kidney evaluation - eGFR measurement  12/15/2024   Medicare Annual Wellness (AWV)  02/01/2025   Colonoscopy  08/08/2026   DTaP/Tdap/Td (2 - Td or Tdap) 05/22/2031    Hepatitis C Screening  Completed   HIV Screening  Completed   HPV VACCINES  Aged Out   Meningococcal B Vaccine  Aged Out   COVID-19 Vaccine  Discontinued   Zoster Vaccines- Shingrix  Discontinued    Health Maintenance  Health Maintenance Due  Topic Date Due   OPHTHALMOLOGY EXAM  02/17/2022   Health Maintenance Items Addressed: Pt to call to confirm DM Eye appt w/[facility]  Additional Screening:  Vision Screening: Recommended annual ophthalmology exams for early detection of glaucoma and other disorders of the eye.  Dental Screening: Recommended annual dental exams for proper oral hygiene  Community Resource Referral / Chronic Care Management: CRR required this visit?  No   CCM required this visit?  No   Plan:    I have personally reviewed and noted the following in the patient's chart:   Medical and social history Use of alcohol, tobacco or illicit drugs  Current medications and supplements including opioid prescriptions. Patient is not currently taking opioid prescriptions. Functional ability and status Nutritional status Physical activity Advanced directives List of other physicians Hospitalizations, surgeries, and ER visits in previous 12 months Vitals Screenings to include cognitive, depression, and falls Referrals and appointments  In addition, I have reviewed and discussed with patient certain preventive protocols, quality metrics, and best practice recommendations. A written personalized care plan for preventive services as well as general  preventive health recommendations were provided to patient.   Nerissa Bannister, LPN   05/27/7828   After Visit Summary: (MyChart) Due to this being a telephonic visit, the after visit summary with patients personalized plan was offered to patient via MyChart   Notes: Nothing significant to report at this time.

## 2024-02-02 NOTE — Patient Instructions (Addendum)
 Roy Richards , Thank you for taking time out of your busy schedule to complete your Annual Wellness Visit with me. I enjoyed our conversation and look forward to speaking with you again next year. I, as well as your care team,  appreciate your ongoing commitment to your health goals. Please review the following plan we discussed and let me know if I can assist you in the future. Your Game plan/ To Do List     Follow up Visits: Next Medicare AWV with our clinical staff: 02/05/25 @ 8:50am televisit   Have you seen your provider in the last 6 months (3 months if uncontrolled diabetes)? Yes Next Office Visit with your provider: 06/21/2024  Clinician Recommendations:  Aim for 30 minutes of exercise or brisk walking, 6-8 glasses of water, and 5 servings of fruits and vegetables each day.   Pt due for eye exam. Pt says he thinks is already scheduled with [facility]. Pt to call and confirm. Eye Doctor: [facility] [address] [place] [address] Ph [phone]       This is a list of the screening recommended for you and due dates:  Health Maintenance  Topic Date Due   Eye exam for diabetics  02/17/2022   Pneumococcal Vaccination (2 of 2 - PCV) 12/15/2024*   Flu Shot  04/13/2024   Yearly kidney health urinalysis for diabetes  05/30/2024   Complete foot exam   05/30/2024   Hemoglobin A1C  06/16/2024   Yearly kidney function blood test for diabetes  12/15/2024   Medicare Annual Wellness Visit  02/01/2025   Colon Cancer Screening  08/08/2026   DTaP/Tdap/Td vaccine (2 - Td or Tdap) 05/22/2031   Hepatitis C Screening  Completed   HIV Screening  Completed   HPV Vaccine  Aged Out   Meningitis B Vaccine  Aged Out   COVID-19 Vaccine  Discontinued   Zoster (Shingles) Vaccine  Discontinued  *Topic was postponed. The date shown is not the original due date.    Advanced directives: (Declined) Advance directive discussed with you today. Even though you declined this today,  please call our office should you change your mind, and we can give you the proper paperwork for you to fill out. Pt is thinking of getting this done. Advance Care Planning is important because it:  [x]  Makes sure you receive the medical care that is consistent with your values, goals, and preferences  [x]  It provides guidance to your family and loved ones and reduces their decisional burden about whether or not they are making the right decisions based on your wishes.  Follow the link provided in your after visit summary or read over the paperwork we have mailed to you to help you started getting your Advance Directives in place. If you need assistance in completing these, please reach out to us  so that we can help you!

## 2024-02-17 ENCOUNTER — Other Ambulatory Visit: Payer: Self-pay | Admitting: Primary Care

## 2024-02-17 DIAGNOSIS — E1159 Type 2 diabetes mellitus with other circulatory complications: Secondary | ICD-10-CM

## 2024-02-17 DIAGNOSIS — I10 Essential (primary) hypertension: Secondary | ICD-10-CM

## 2024-02-18 ENCOUNTER — Other Ambulatory Visit: Payer: Self-pay | Admitting: Primary Care

## 2024-02-18 DIAGNOSIS — F419 Anxiety disorder, unspecified: Secondary | ICD-10-CM

## 2024-02-24 ENCOUNTER — Other Ambulatory Visit: Payer: Self-pay | Admitting: Primary Care

## 2024-02-24 DIAGNOSIS — J449 Chronic obstructive pulmonary disease, unspecified: Secondary | ICD-10-CM

## 2024-03-21 DIAGNOSIS — H524 Presbyopia: Secondary | ICD-10-CM | POA: Diagnosis not present

## 2024-03-21 DIAGNOSIS — H5203 Hypermetropia, bilateral: Secondary | ICD-10-CM | POA: Diagnosis not present

## 2024-03-21 DIAGNOSIS — H2513 Age-related nuclear cataract, bilateral: Secondary | ICD-10-CM | POA: Diagnosis not present

## 2024-03-21 DIAGNOSIS — E119 Type 2 diabetes mellitus without complications: Secondary | ICD-10-CM | POA: Diagnosis not present

## 2024-03-21 DIAGNOSIS — H52209 Unspecified astigmatism, unspecified eye: Secondary | ICD-10-CM | POA: Diagnosis not present

## 2024-03-22 ENCOUNTER — Other Ambulatory Visit: Payer: Self-pay | Admitting: Primary Care

## 2024-03-22 DIAGNOSIS — J449 Chronic obstructive pulmonary disease, unspecified: Secondary | ICD-10-CM

## 2024-03-22 MED ORDER — ALBUTEROL SULFATE HFA 108 (90 BASE) MCG/ACT IN AERS: 1.0000 | INHALATION_SPRAY | Freq: Four times a day (QID) | RESPIRATORY_TRACT | 0 refills | Status: DC | PRN

## 2024-03-22 NOTE — Telephone Encounter (Unsigned)
 Copied from CRM [id number]. Topic: Clinical - Medication Refill >> Mar 22, 2024 10:27 AM Franky GRADE wrote: Medication: albuterol  (VENTOLIN  HFA) 108 (90 Base) MCG/ACT inhaler [[id number]]  Has the patient contacted their pharmacy? No (Agent: If no, request that the patient contact the pharmacy for the refill. If patient does not wish to contact the pharmacy document the reason why and proceed with request.) (Agent: If yes, when and what did the pharmacy advise?)  This is the patient's preferred pharmacy:  [facility] [id number] [place], [place] - [address] [place] [address] Phone: [phone] Fax: [phone]   Is this the correct pharmacy for this prescription? Yes If no, delete pharmacy and type the correct one.   Has the prescription been filled recently? No  Is the patient out of the medication? Yes  Has the patient been seen for an appointment in the last year OR does the patient have an upcoming appointment? Yes  Can we respond through MyChart? No   Agent: Please be advised that Rx refills may take up to 3 business days. We ask that you follow-up with your pharmacy.

## 2024-05-14 ENCOUNTER — Observation Stay
Admission: EM | Admit: 2024-05-14 | Discharge: 2024-05-15 | Disposition: A | Discharge status: Home / Self Care | Attending: Obstetrics and Gynecology | Admitting: Obstetrics and Gynecology

## 2024-05-14 ENCOUNTER — Emergency Department

## 2024-05-14 ENCOUNTER — Other Ambulatory Visit: Payer: Self-pay

## 2024-05-14 DIAGNOSIS — N529 Male erectile dysfunction, unspecified: Secondary | ICD-10-CM | POA: Diagnosis present

## 2024-05-14 DIAGNOSIS — J4489 Other specified chronic obstructive pulmonary disease: Secondary | ICD-10-CM | POA: Diagnosis not present

## 2024-05-14 DIAGNOSIS — J441 Chronic obstructive pulmonary disease with (acute) exacerbation: Principal | ICD-10-CM

## 2024-05-14 DIAGNOSIS — E785 Hyperlipidemia, unspecified: Secondary | ICD-10-CM | POA: Diagnosis not present

## 2024-05-14 DIAGNOSIS — I700 Atherosclerosis of aorta: Secondary | ICD-10-CM | POA: Diagnosis not present

## 2024-05-14 DIAGNOSIS — J449 Chronic obstructive pulmonary disease, unspecified: Secondary | ICD-10-CM | POA: Diagnosis present

## 2024-05-14 DIAGNOSIS — Z87891 Personal history of nicotine dependence: Secondary | ICD-10-CM | POA: Insufficient documentation

## 2024-05-14 DIAGNOSIS — F4310 Post-traumatic stress disorder, unspecified: Secondary | ICD-10-CM | POA: Diagnosis present

## 2024-05-14 DIAGNOSIS — I10 Essential (primary) hypertension: Secondary | ICD-10-CM | POA: Diagnosis present

## 2024-05-14 DIAGNOSIS — E119 Type 2 diabetes mellitus without complications: Secondary | ICD-10-CM

## 2024-05-14 DIAGNOSIS — Z79899 Other long term (current) drug therapy: Secondary | ICD-10-CM | POA: Insufficient documentation

## 2024-05-14 DIAGNOSIS — J9601 Acute respiratory failure with hypoxia: Principal | ICD-10-CM | POA: Diagnosis present

## 2024-05-14 DIAGNOSIS — I2510 Atherosclerotic heart disease of native coronary artery without angina pectoris: Secondary | ICD-10-CM | POA: Diagnosis present

## 2024-05-14 DIAGNOSIS — D72829 Elevated white blood cell count, unspecified: Secondary | ICD-10-CM | POA: Insufficient documentation

## 2024-05-14 DIAGNOSIS — F32A Depression, unspecified: Secondary | ICD-10-CM | POA: Diagnosis not present

## 2024-05-14 DIAGNOSIS — J984 Other disorders of lung: Secondary | ICD-10-CM | POA: Diagnosis not present

## 2024-05-14 DIAGNOSIS — R079 Chest pain, unspecified: Secondary | ICD-10-CM | POA: Diagnosis not present

## 2024-05-14 DIAGNOSIS — Z7984 Long term (current) use of oral hypoglycemic drugs: Secondary | ICD-10-CM | POA: Insufficient documentation

## 2024-05-14 DIAGNOSIS — R0602 Shortness of breath: Secondary | ICD-10-CM | POA: Diagnosis present

## 2024-05-14 DIAGNOSIS — R0902 Hypoxemia: Secondary | ICD-10-CM

## 2024-05-14 LAB — BRAIN NATRIURETIC PEPTIDE: B Natriuretic Peptide: 93.9 pg/mL (ref 0.0–100.0)

## 2024-05-14 LAB — RESP PANEL BY RT-PCR (RSV, FLU A&B, COVID)  RVPGX2
Influenza A by PCR: NEGATIVE
Influenza B by PCR: NEGATIVE
Resp Syncytial Virus by PCR: NEGATIVE
SARS Coronavirus 2 by RT PCR: NEGATIVE

## 2024-05-14 LAB — CBC
HCT: 46.6 % (ref 39.0–52.0)
Hemoglobin: 15.4 g/dL (ref 13.0–17.0)
MCH: 30.8 pg (ref 26.0–34.0)
MCHC: 33 g/dL (ref 30.0–36.0)
MCV: 93.2 fL (ref 80.0–100.0)
Platelets: 194 K/uL (ref 150–400)
RBC: 5 MIL/uL (ref 4.22–5.81)
RDW: 13.2 % (ref 11.5–15.5)
WBC: 19.5 K/uL — ABNORMAL HIGH (ref 4.0–10.5)
nRBC: 0 % (ref 0.0–0.2)

## 2024-05-14 LAB — BASIC METABOLIC PANEL WITH GFR
Anion gap: 10 (ref 5–15)
BUN: 11 mg/dL (ref 8–23)
CO2: 31 mmol/L (ref 22–32)
Calcium: 9.1 mg/dL (ref 8.9–10.3)
Chloride: 95 mmol/L — ABNORMAL LOW (ref 98–111)
Creatinine, Ser: 0.97 mg/dL (ref 0.61–1.24)
GFR, Estimated: >60 mL/min (ref >60)
Glucose, Bld: 143 mg/dL — ABNORMAL HIGH (ref 70–99)
Potassium: 4 mmol/L (ref 3.5–5.1)
Sodium: 136 mmol/L (ref 135–145)

## 2024-05-14 LAB — BLOOD GAS, VENOUS
Acid-Base Excess: 9.7 mmol/L — ABNORMAL HIGH (ref 0.0–2.0)
Bicarbonate: 37.3 mmol/L — ABNORMAL HIGH (ref 20.0–28.0)
O2 Saturation: 72 %
Patient temperature: 37
pCO2, Ven: 63 mmHg — ABNORMAL HIGH (ref 44–60)
pH, Ven: 7.38 (ref 7.25–7.43)
pO2, Ven: 42 mmHg (ref 32–45)

## 2024-05-14 LAB — TROPONIN I (HIGH SENSITIVITY)
Troponin I (High Sensitivity): 13 ng/L (ref <18)
Troponin I (High Sensitivity): 11 ng/L (ref <18)

## 2024-05-14 LAB — GLUCOSE, CAPILLARY
Glucose-Capillary: 258 mg/dL — ABNORMAL HIGH (ref 70–99)
Glucose-Capillary: 192 mg/dL — ABNORMAL HIGH (ref 70–99)

## 2024-05-14 LAB — LACTIC ACID, PLASMA
Lactic Acid, Venous: 1.5 mmol/L (ref 0.5–1.9)
Lactic Acid, Venous: 1.3 mmol/L (ref 0.5–1.9)

## 2024-05-14 LAB — PROCALCITONIN: Procalcitonin: <0.1 ng/mL

## 2024-05-14 MED ORDER — HEPARIN SODIUM (PORCINE) 5000 UNIT/ML IJ SOLN: 5000.0000 [IU] | Freq: Three times a day (TID) | INTRAMUSCULAR | Status: DC

## 2024-05-14 MED ORDER — CITALOPRAM HYDROBROMIDE 20 MG PO TABS: 40.0000 mg | ORAL_TABLET | Freq: Every day | ORAL | Status: DC | PRN

## 2024-05-14 MED ORDER — ATORVASTATIN CALCIUM 20 MG PO TABS
40.0000 mg | ORAL_TABLET | Freq: Every day | ORAL | Status: DC
  Filled 2024-05-14: qty 2

## 2024-05-14 MED ORDER — SODIUM CHLORIDE 0.9 % IV SOLN
1.0000 g | Freq: Once | INTRAVENOUS | Status: AC
  Administered 2024-05-14: 1 g via INTRAVENOUS
  Filled 2024-05-14: qty 10

## 2024-05-14 MED ORDER — ACETAMINOPHEN 325 MG PO TABS
650.0000 mg | ORAL_TABLET | Freq: Four times a day (QID) | ORAL | Status: DC | PRN
Start: 2024-05-14 — End: 2024-05-19

## 2024-05-14 MED ORDER — METHYLPREDNISOLONE SODIUM SUCC 125 MG IJ SOLR
125.0000 mg | Freq: Once | INTRAMUSCULAR | Status: AC
  Administered 2024-05-14: 125 mg via INTRAVENOUS
  Filled 2024-05-14: qty 2

## 2024-05-14 MED ORDER — INSULIN ASPART 100 UNIT/ML IJ SOLN: 0.0000 [IU] | Freq: Every day | INTRAMUSCULAR | Status: DC

## 2024-05-14 MED ORDER — SODIUM CHLORIDE 0.9 % IV SOLN
500.0000 mg | INTRAVENOUS | Status: DC
  Filled 2024-05-14: qty 5

## 2024-05-14 MED ORDER — IPRATROPIUM-ALBUTEROL 0.5-2.5 (3) MG/3ML IN SOLN: 3.0000 mL | RESPIRATORY_TRACT | Status: DC | PRN

## 2024-05-14 MED ORDER — SODIUM CHLORIDE 0.9 % IV SOLN
100.0000 mg | Freq: Once | INTRAVENOUS | Status: AC
  Administered 2024-05-14: 100 mg via INTRAVENOUS
  Filled 2024-05-14: qty 100

## 2024-05-14 MED ORDER — ONDANSETRON HCL 4 MG/2ML IJ SOLN: 4.0000 mg | Freq: Four times a day (QID) | INTRAMUSCULAR | Status: DC | PRN

## 2024-05-14 MED ORDER — IPRATROPIUM-ALBUTEROL 0.5-2.5 (3) MG/3ML IN SOLN
3.0000 mL | Freq: Once | RESPIRATORY_TRACT | Status: AC
  Administered 2024-05-14: 3 mL via RESPIRATORY_TRACT
  Filled 2024-05-14: qty 3

## 2024-05-14 MED ORDER — HYDRALAZINE HCL 20 MG/ML IJ SOLN: 5.0000 mg | Freq: Four times a day (QID) | INTRAMUSCULAR | Status: DC | PRN

## 2024-05-14 MED ORDER — ALBUTEROL SULFATE (2.5 MG/3ML) 0.083% IN NEBU
2.5000 mg | INHALATION_SOLUTION | Freq: Once | RESPIRATORY_TRACT | Status: AC
  Administered 2024-05-14: 2.5 mg via RESPIRATORY_TRACT
  Filled 2024-05-14: qty 3

## 2024-05-14 MED ORDER — IPRATROPIUM-ALBUTEROL 0.5-2.5 (3) MG/3ML IN SOLN
3.0000 mL | Freq: Three times a day (TID) | RESPIRATORY_TRACT | Status: DC
  Administered 2024-05-14 – 2024-05-15 (×2): 3 mL via RESPIRATORY_TRACT
  Filled 2024-05-14 (×2): qty 3

## 2024-05-14 MED ORDER — SENNOSIDES-DOCUSATE SODIUM 8.6-50 MG PO TABS: 1.0000 | ORAL_TABLET | Freq: Every evening | ORAL | Status: DC | PRN

## 2024-05-14 MED ORDER — IPRATROPIUM-ALBUTEROL 0.5-2.5 (3) MG/3ML IN SOLN: 3.0000 mL | Freq: Four times a day (QID) | RESPIRATORY_TRACT | Status: DC

## 2024-05-14 MED ORDER — FLUTICASONE FUROATE-VILANTEROL 200-25 MCG/ACT IN AEPB
1.0000 | INHALATION_SPRAY | Freq: Every day | RESPIRATORY_TRACT | Status: DC
  Filled 2024-05-14: qty 28

## 2024-05-14 MED ORDER — ENOXAPARIN SODIUM 40 MG/0.4ML IJ SOSY
40.0000 mg | PREFILLED_SYRINGE | Freq: Every day | INTRAMUSCULAR | Status: DC
  Filled 2024-05-14: qty 0.4

## 2024-05-14 MED ORDER — INSULIN ASPART 100 UNIT/ML IJ SOLN
0.0000 [IU] | Freq: Three times a day (TID) | INTRAMUSCULAR | Status: DC
  Administered 2024-05-14: 8 [IU] via SUBCUTANEOUS
  Administered 2024-05-15: 3 [IU] via SUBCUTANEOUS
  Filled 2024-05-14 (×3): qty 1

## 2024-05-14 MED ORDER — ACETAMINOPHEN 650 MG RE SUPP: 650.0000 mg | Freq: Four times a day (QID) | RECTAL | Status: DC | PRN

## 2024-05-14 MED ORDER — METHYLPREDNISOLONE SODIUM SUCC 40 MG IJ SOLR: 40.0000 mg | Freq: Every day | INTRAMUSCULAR | Status: DC

## 2024-05-14 MED ORDER — LOSARTAN POTASSIUM 50 MG PO TABS: 50.0000 mg | ORAL_TABLET | Freq: Every day | ORAL | Status: DC

## 2024-05-14 MED ORDER — ONDANSETRON HCL 4 MG PO TABS: 4.0000 mg | ORAL_TABLET | Freq: Four times a day (QID) | ORAL | Status: DC | PRN

## 2024-05-14 MED ORDER — AMLODIPINE BESYLATE 10 MG PO TABS: 10.0000 mg | ORAL_TABLET | Freq: Every day | ORAL | Status: DC

## 2024-05-14 MED ORDER — IPRATROPIUM-ALBUTEROL 0.5-2.5 (3) MG/3ML IN SOLN: 3.0000 mL | Freq: Once | RESPIRATORY_TRACT | Status: DC

## 2024-05-14 NOTE — Progress Notes (Addendum)
 Pt refused 1700 insulin . His CBG was 258 and was supposed to receive 8 units of Aspart. Pt states He doesn't believe he needs insulin  and would like to speak with his PCP about his diabetes. He was questioning why he was receiving insulin  and what would cause his sugar to go up. I explained to him that his diet plays a part in his bld sugar control. Told him that foods high in carbohydrates and sugar (like rice and fruit, which he had for supper) can cause his sugar to elevate.Pt still refused. Dr. Greig Free was notified of refusal. No new orders issues.   Resident agreed to his insulin .

## 2024-05-14 NOTE — Hospital Course (Addendum)
 [name], Roy Richards. is a 63 year old male with history of hypertension, prior cocaine use, prior tobacco use, COPD, hyperlipidemia, hypertension, bipolar type II, ADHD, who presents to the emergency department for chief concerns of shortness of breath.  Vitals in the ED showed T 98.3, rr of 24, heart rate 82, blood pressure 139/54, SpO2 of 90% on room air, patient desatted to 87% on room air and was placed on 2 L nasal cannula.  Serum sodium is 136, potassium 4.0, chloride 95, bicarb 31, BUN of 11, serum creatinine 0.97, EGFR greater than 60, nonfasting blood glucose 143, WBC 19.5, hemoglobin 15.4, platelets 194.  BNP was 93.9, high since he troponin is 13 and on repeat is 11, Pro-Cal was less than 0.10, lactic acid 1.5 and on repeat is 1.3.  COVID/influenza A/influenza B/RSV PCR were negative.  Blood cultures x 2 are in process.  ED treatment: Albuterol  nebulizer x 2, DuoNebs x 3 treatments, ceftriaxone  1 g IV one-time dose, doxycycline  100 mg IV one-time dose.

## 2024-05-14 NOTE — Assessment & Plan Note (Addendum)
 I suspect this is secondary to demargination in setting of recent steroid, prednisone  oral medication use (?) based on med reconciliaation However, patient is unsure if he is taking prednison Currently no active signs of infection at this time Blood cultures x 2 are in process Recheck CBC in the a.m.

## 2024-05-14 NOTE — H&P (Addendum)
 History and Physical   Roy Richards. FMW:[id number] DOB: 1960-10-23 DOA: 05/14/2024  PCP: [name], NP  Patient coming from: Home via POV  I have personally briefly reviewed patient's old medical records in [facility] EMR.  Chief Concern: Shortness of breath  HPI: Roy Richards, Roy Richards. is a 63 year old male with history of hypertension, prior cocaine use, prior tobacco use, COPD, hyperlipidemia, hypertension, bipolar type II, ADHD, who presents to the emergency department for chief concerns of shortness of breath.  Vitals in the ED showed T 98.3, rr of 24, heart rate 82, blood pressure 139/54, SpO2 of 90% on room air, patient desatted to 87% on room air and was placed on 2 L nasal cannula.  Serum sodium is 136, potassium 4.0, chloride 95, bicarb 31, BUN of 11, serum creatinine 0.97, EGFR greater than 60, nonfasting blood glucose 143, WBC 19.5, hemoglobin 15.4, platelets 194.  BNP was 93.9, high since he troponin is 13 and on repeat is 11, Pro-Cal was less than 0.10, lactic acid 1.5 and on repeat is 1.3.  COVID/influenza A/influenza B/RSV PCR were negative.  Blood cultures x 2 are in process.  ED treatment: Albuterol  nebulizer x 2, DuoNebs x 3 treatments, ceftriaxone  1 g IV one-time dose, doxycycline  100 mg IV one-time dose. -------------------------------------- At bedside, patient able to tell me his first and last name, age, location, current calendar year.  Reports that at baseline he produces green-yellow sputum and this is not new.  However over the last week he has been having worsening shortness of breath and increased number of times he coughs.  He reports he does use his baseline inhaler however does not have prescription for nebulizer.  He reports he does not wear oxygen at baseline.  He denies known sick contacts.  He denies chest pain, abdominal pain, dysuria, hematuria, diarrhea.  Social history: He lives at home on his own.  At his peak he was smoking about 15  cigarettes/day and he quit last week.  He reports he will not resume smoking after being discharged from the hospital.  He reports he drinks beer infrequently.  His last drink was 3 beers and that was on Saturday.  At the most on a weekend he will have 6 beers.  He denies recreational drug use.  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  ROS: Constitutional: no weight change, no fever ENT/Mouth: no sore throat, no rhinorrhea Eyes: no eye pain, no vision changes Cardiovascular: no chest pain, + dyspnea,  no edema, no palpitations Respiratory: + cough, + sputum, no wheezing Gastrointestinal: no nausea, no vomiting, no diarrhea, no constipation Genitourinary: no urinary incontinence, no dysuria, no hematuria Musculoskeletal: no arthralgias, no myalgias Skin: no skin lesions, no pruritus, Neuro: no weakness, no loss of consciousness, no syncope Psych: no anxiety, no depression, + decrease appetite Heme/Lymph: no bruising, no bleeding  ED Course: Discussed with EDP, patient requiring hospitalization for chief concerns of COPD exacerbation.  Assessment/Plan  Principal Problem:   Acute hypoxic respiratory failure (HCC) Active Problems:   COPD (chronic obstructive pulmonary disease) (HCC)   Essential hypertension   Diabetes mellitus type 2, noninsulin dependent (HCC)   Hyperlipidemia   Aortic atherosclerosis (HCC)   Erectile dysfunction   Coronary artery disease involving native coronary artery of native heart without angina pectoris   Depressive disorder   Post-traumatic stress disorder, unspecified   Leukocytosis   Assessment and Plan:  * Acute hypoxic  respiratory failure (HCC) Secondary to COPD exacerbation Patient desatted to 87% on room air Continue oxygen supplementation to maintain SpO2 greater than 92% Continuous pulse oximetry DuoNebs 4 times daily, 3 doses ordered on admission Continue with azithromycin  500 mg IV  daily for anti-inflammatory benefits Status post Solu-Medrol  125 mg IV per EDP On admission I have ordered Solu-Medrol  40 mg IV daily for 05/15/2024, one-time dose  COPD (chronic obstructive pulmonary disease) (HCC) Patient not on home oxygen supplementation at baseline and patient does not have access to nebulizer treatments at home Long-acting inhaler resumed on admission Incentive spirometry, flutter valve  Diabetes mellitus type 2, noninsulin dependent (HCC) Home metformin  500 mg daily will not be resumed on admission Insulin  SSI with at bedtime coverage ordered Goal inpatient blood glucose levels 140-180  Essential hypertension Currently controlled Home amlodipine  10 mg daily, losartan  50 mg daily were resumed on admission Hydralazine  5 mg IV every 6 hours as needed for SBP greater 165, 5 days ordered  Hyperlipidemia Home atorvastatin  40 mg nightly resumed  Leukocytosis I suspect this is secondary to demargination in setting of recent steroid, prednisone  oral medication use (?) based on med reconciliaation However, patient is unsure if he is taking prednison Currently no active signs of infection at this time Blood cultures x 2 are in process Recheck CBC in the a.m.  Depressive disorder With anxiety Citalopram  40 mg daily as needed for anxiety resumed on admission  Chart reviewed.   DVT prophylaxis: Enoxaparin  subcutaneous weight-based every 24 hours Code Status: Full code Diet: Heart healthy/carb modified Family Communication: A phone call was offered, patient declined stating that he ready updated everyone that he needs to be updated. 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   Emphysema of lung (HCC) 2015   History of colon polyps 06/17/2023   Hyperlipidemia    Hypertension    Hypoxia 05/07/2020   Uncomplicated alcohol dependence (HCC) 09/12/2019   Past Surgical History:  Procedure Laterality Date   BIOPSY  08/09/2023   Procedure: BIOPSY;  Surgeon: Therisa Bi, MD;  [facility] ENDOSCOPY;  Service: Gastroenterology;;   COLONOSCOPY WITH PROPOFOL  N/A 08/09/2023   Procedure: COLONOSCOPY WITH PROPOFOL ;  Surgeon: Therisa Bi, MD;  Location: [facility] ENDOSCOPY;  Service: Gastroenterology;  Laterality: N/A;   HERNIA REPAIR     POLYPECTOMY  08/09/2023   Procedure: POLYPECTOMY;  Surgeon: Therisa Bi, MD;  Location: [facility] ENDOSCOPY;  Service: Gastroenterology;;   Social History:  reports that he has quit smoking. His smoking use included cigarettes. He started smoking about 16 months ago. He has a 13 pack-year smoking history. He has never used smokeless tobacco. He reports current alcohol use. He reports that he does not use drugs.  No Known Allergies Family History  Problem Relation Age of Onset   COPD Mother    Heart attack Father 63   Heart disease Father    Hyperlipidemia Father    Hypertension Father    Family history: Family history reviewed and not pertinent.  Prior to Admission medications  Medication Sig Start Date End Date Taking? Authorizing Provider  albuterol  (VENTOLIN  HFA) 108 (90 Base) MCG/ACT inhaler Inhale 1-2 puffs into the lungs every 6 (six) hours as needed for wheezing or shortness of breath. 03/22/24  Yes [name], NP  amLODipine  (NORVASC ) 10 MG tablet TAKE ONE TABLET BY MOUTH DAILY FOR BLOOD PRESSURE 01/02/24  Yes Clark, Katherine K, NP  atorvastatin  (LIPITOR) 40 MG tablet TAKE 1 TABLET BY MOUTH DAILY FOR CHOLESTEROL  01/30/24  Yes Clark, Katherine K, NP  Cholecalciferol (VITAMIN D) 50 MCG (2000 UT) tablet Take 2,000 Units by mouth daily.   Yes [provider]  citalopram  (CELEXA ) 40 MG tablet TAKE 1 TABLET BY MOUTH DAILY FOR ANXIETY 02/19/24  Yes Clark, Katherine K, NP  fluticasone -salmeterol (ADVAIR) 250-50 MCG/ACT AEPB INHALE 1 PUFF BY MOUTH EVERY MORNING AND INHALE 1 PUFF BY MOUTH EVERY EVENING 02/24/24  Yes Clark, Katherine K, NP  losartan  (COZAAR ) 50 MG tablet TAKE 1 TABLET BY MOUTH DAILY FOR BLOOD PRESSURE 02/17/24  Yes Clark, Katherine K, NP  metFORMIN  (GLUCOPHAGE -XR) 500 MG 24 hr tablet Take 1 tablet (500 mg total) by mouth daily with breakfast. for diabetes. 02/17/24  Yes Clark, Katherine K, NP  Multiple Vitamins-Minerals (MULTIVITAMIN ADULT PO) Take by mouth.   Yes [provider]  predniSONE  (DELTASONE ) 20 MG tablet Take 2 tablets by mouth once daily in the morning for 5 days. 12/16/23  Yes [name], NP  sildenafil  (VIAGRA ) 100 MG tablet Take 1/2 to 1 tablet by mouth 30 min prior to sexual activity 07/13/22  Yes [name], NP   Physical Exam: Vitals:   05/14/24 1100 05/14/24 1130 05/14/24 1500 05/14/24 1518  BP: 132/63 (!) 128/55 (!) 120/52   Pulse: 85 83 84 72  Resp: (!) 29 (!) 28 18 18   Temp:   98.3 F (36.8 C)   TempSrc:   Oral   SpO2: (!) 88% (!) 87% 94% 91%  Weight:      Height:       Constitutional: appears age-appropriate, NAD, calm Eyes: PERRL, lids and conjunctivae normal ENMT: Mucous membranes are moist. Posterior pharynx clear of any exudate or lesions. Age-appropriate dentition. Hearing appropriate Neck: normal, supple, no masses, no thyromegaly Respiratory:  + end expiratory wheezing, no crackles. Normal respiratory effort. No accessory muscle use.  Nasal cannula in place. Cardiovascular: Regular rate and rhythm, no murmurs / rubs / gallops. No extremity edema. 2+ pedal pulses. No carotid bruits.  Abdomen: no tenderness, no masses palpated, no  hepatosplenomegaly. Bowel sounds positive.  Musculoskeletal: no clubbing / cyanosis. No joint deformity upper and lower extremities. Good ROM, no contractures, no atrophy. Normal muscle tone.  Skin: no rashes, lesions, ulcers. No induration.  Patient has tanned skin. Neurologic: Sensation intact. Strength 5/5 in all 4.  Psychiatric: Normal judgment and insight. Alert and oriented x 3. Normal mood.   EKG: independently reviewed, showing sinus rhythm with rate of 84, QTc 444  Chest x-ray on Admission: I personally reviewed and I agree with radiologist reading as below.  DG Chest 2 View Result Date: 05/14/2024 CLINICAL DATA:  63 year old male with chest pain and shortness of breath. Former smoker. EXAM: CHEST - 2 VIEW COMPARISON:  Screening chest CT 05/23/2023 and earlier. FINDINGS: AP and lateral views 0934 hours. Chronic large lung volumes and coarse bilateral pulmonary interstitial opacity. Mild but increased patchy opacity at the right lung base on the PA view, not correlated on the lateral. No pneumothorax, pulmonary edema, pleural effusion or  consolidation. Normal cardiac size and mediastinal contours. Visualized tracheal air column is within normal limits. Osteopenia. No acute osseous abnormality identified. Negative visible bowel gas. IMPRESSION: Chronic lung disease with hyperinflation, and evidence of acute infectious exacerbation at the right lung base. No consolidation or effusion. Electronically Signed   By: VEAR Hurst M.D.   On: 05/14/2024 09:49   Labs on Admission: I have personally reviewed following labs  CBC: Recent Labs  Lab 05/14/24 0918  WBC 19.5*  HGB 15.4  HCT 46.6  MCV 93.2  PLT 194   Basic Metabolic Panel: Recent Labs  Lab 05/14/24 0918  NA 136  K 4.0  CL 95*  CO2 31  GLUCOSE 143*  BUN 11  CREATININE 0.97  CALCIUM  9.1   GFR: Estimated Creatinine Clearance: 66.9 mL/min (by C-G formula based on SCr of 0.97 mg/dL).  Urine analysis:    Component Value  Date/Time   COLORURINE AMBER (A) 10/14/2022 1629   APPEARANCEUR HAZY (A) 10/14/2022 1629   LABSPEC 1.032 (H) 10/14/2022 1629   PHURINE 5.0 10/14/2022 1629   GLUCOSEU NEGATIVE 10/14/2022 1629   HGBUR NEGATIVE 10/14/2022 1629   BILIRUBINUR MODERATE (A) 10/14/2022 1629   KETONESUR NEGATIVE 10/14/2022 1629   PROTEINUR 30 (A) 10/14/2022 1629   NITRITE NEGATIVE 10/14/2022 1629   LEUKOCYTESUR NEGATIVE 10/14/2022 1629   This document was prepared using Dragon Voice Recognition software and may include unintentional dictation errors.  Dr. Sherre [facility]  If 7PM-7AM, please contact overnight-coverage provider If 7AM-7PM, please contact day attending provider [email or web address]  05/14/2024, 5:39 PM

## 2024-05-14 NOTE — Assessment & Plan Note (Signed)
 With anxiety Citalopram  40 mg daily as needed for anxiety resumed on admission

## 2024-05-14 NOTE — Assessment & Plan Note (Signed)
 Currently controlled Home amlodipine  10 mg daily, losartan  50 mg daily were resumed on admission Hydralazine  5 mg IV every 6 hours as needed for SBP greater 165, 5 days ordered

## 2024-05-14 NOTE — Assessment & Plan Note (Signed)
Home atorvastatin 40 mg nightly resumed

## 2024-05-14 NOTE — Assessment & Plan Note (Addendum)
 Secondary to COPD exacerbation Patient desatted to 87% on room air Continue oxygen supplementation to maintain SpO2 greater than 92% Continuous pulse oximetry DuoNebs 4 times daily, 3 doses ordered on admission Continue with azithromycin  500 mg IV daily for anti-inflammatory benefits Status post Solu-Medrol  125 mg IV per EDP On admission I have ordered Solu-Medrol  40 mg IV daily for 05/15/2024, one-time dose

## 2024-05-14 NOTE — Assessment & Plan Note (Signed)
 Home metformin  500 mg daily will not be resumed on admission Insulin  SSI with at bedtime coverage ordered Goal inpatient blood glucose levels 140-180

## 2024-05-14 NOTE — ED Triage Notes (Addendum)
 Pt to ED via POV from home. Pt reports SOB that has been ongoing from COPD but worsened Saturday. Pt reports recently quit smoking tobacco a week ago. Pt also states centralized CP. Pt also reports diarrhea since Saturday.

## 2024-05-14 NOTE — ED Provider Notes (Signed)
 [facility] Provider Note    Event Date/Time   First MD Initiated Contact with Patient 05/14/24 [phone]     (approximate)   History   Shortness of Breath   HPI  Roy Richards. is a 63 y.o. male  subclavian arterial stenosis, COPD with tobacco dependence (reports he quit tobacco use last week), type 2 diabetes, bipolar disorder, prior cocaine use, hypertension who presents to emergency department with 3 days of progressively worsening shortness of breath, cough and diarrhea.  Patient denies chest pain but reports chest tightness.  He has not had any fevers or chills but is bringing up green sputum.  He denies having any access previously to nebulizing treatments.      Physical Exam   Triage Vital Signs: ED Triage Vitals  Encounter Vitals Group     BP      Girls Systolic BP Percentile      Girls Diastolic BP Percentile      Boys Systolic BP Percentile      Boys Diastolic BP Percentile      Pulse      Resp      Temp      Temp src      SpO2      Weight      Height      Head Circumference      Peak Flow      Pain Score      Pain Loc      Pain Education      Exclude from Growth Chart     Most recent vital signs: Vitals:   05/14/24 1500 05/14/24 1518  BP: (!) 120/52   Pulse: 84 72  Resp: 18 18  Temp: 98.3 F (36.8 C)   SpO2: 94% 91%    Nursing Triage Note reviewed. Vital signs reviewed and patients oxygen saturation is hypoxic  General: Patient is well nourished, well developed, awake and alert, resting comfortably in no acute distress Head: Normocephalic and atraumatic Eyes: Normal inspection, extraocular muscles intact, no conjunctival pallor Ear, nose, throat: Normal external exam Neck: Normal range of motion Respiratory: Patient is in respiratory distress (mild) with accessory muscle use and not moving good air bilaterally Cardiovascular: Patient is not tachycardic, RRR without murmur appreciated GI: Abd SNT with no guarding or  rebound  Back: Normal inspection of the back with good strength and range of motion throughout all ext Extremities: pulses intact with good cap refills, no LE pitting edema or calf tenderness Neuro: The patient is alert and oriented to person, place, and time, appropriately conversive, with 5/5 bilat UE/LE strength, no gross motor or sensory defects noted. Coordination appears to be adequate. Skin: Warm, dry, and intact Psych: normal mood and affect, no SI or HI  ED Results / Procedures / Treatments   Labs (all labs ordered are listed, but only abnormal results are displayed) Labs Reviewed  BASIC METABOLIC PANEL WITH GFR - Abnormal; Notable for the following components:      Result Value   Chloride 95 (*)    Glucose, Bld 143 (*)    All other components within normal limits  CBC - Abnormal; Notable for the following components:   WBC 19.5 (*)    All other components within normal limits  BLOOD GAS, VENOUS - Abnormal; Notable for the following components:   pCO2, Ven 63 (*)    Bicarbonate 37.3 (*)    Acid-Base Excess 9.7 (*)    All other components  within normal limits  RESP PANEL BY RT-PCR (RSV, FLU A&B, COVID)  [id number]  CULTURE, BLOOD (ROUTINE X 2)  CULTURE, BLOOD (ROUTINE X 2)  BRAIN NATRIURETIC PEPTIDE  PROCALCITONIN  LACTIC ACID, PLASMA  LACTIC ACID, PLASMA  TROPONIN I (HIGH SENSITIVITY)  TROPONIN I (HIGH SENSITIVITY)     EKG EKG and rhythm strip are interpreted by myself:  Poor baseline  EKG: [Normal sinus rhythm] at heart rate of 84, normal QRS duration, QTc 444, nonspecific ST segments and T waves no ectopy EKG not consistent with Acute STEMI Rhythm strip: NSR in lead II   RADIOLOGY XR chest: No acute abnormality on my independent review interpretation radiologist agrees   PROCEDURES:  Critical Care performed: Yes  .Critical Care  Performed by: Nicholaus Rolland BRAVO, MD Authorized by: Nicholaus Rolland BRAVO, MD   Critical care provider statement:    Critical care  time (minutes):  30   Critical care was necessary to treat or prevent imminent or life-threatening deterioration of the following conditions:  Respiratory failure   Critical care was time spent personally by me on the following activities:  Development of treatment plan with patient or surrogate, discussions with consultants, evaluation of patient's response to treatment, examination of patient, ordering and review of laboratory studies, ordering and review of radiographic studies, ordering and performing treatments and interventions, pulse oximetry, re-evaluation of patient's condition and review of old charts   Care discussed with: admitting provider   Comments:     Requirement for multiple nebulizing treatments (3) in succession    MEDICATIONS ORDERED IN ED: Medications  atorvastatin  (LIPITOR) tablet 40 mg (has no administration in time range)  acetaminophen  (TYLENOL ) tablet 650 mg (has no administration in time range)    Or  acetaminophen  (TYLENOL ) suppository 650 mg (has no administration in time range)  ondansetron  (ZOFRAN ) tablet 4 mg (has no administration in time range)    Or  ondansetron  (ZOFRAN ) injection 4 mg (has no administration in time range)  heparin  injection 5,000 Units (has no administration in time range)  senna-docusate (Senokot-S) tablet 1 tablet (has no administration in time range)  azithromycin  (ZITHROMAX ) 500 mg in sodium chloride  0.9 % 250 mL IVPB (has no administration in time range)  insulin  aspart (novoLOG ) injection 0-15 Units (has no administration in time range)  insulin  aspart (novoLOG ) injection 0-5 Units (has no administration in time range)  amLODipine  (NORVASC ) tablet 10 mg (has no administration in time range)  losartan  (COZAAR ) tablet 50 mg (has no administration in time range)  citalopram  (CELEXA ) tablet 40 mg (has no administration in time range)  methylPREDNISolone  sodium succinate (SOLU-MEDROL ) 40 mg/mL injection 40 mg (has no administration in  time range)  fluticasone  furoate-vilanterol (BREO ELLIPTA ) 200-25 MCG/ACT 1 puff (has no administration in time range)  hydrALAZINE  (APRESOLINE ) injection 5 mg (has no administration in time range)  ipratropium-albuterol  (DUONEB) 0.5-2.5 (3) MG/3ML nebulizer solution 3 mL (has no administration in time range)  ipratropium-albuterol  (DUONEB) 0.5-2.5 (3) MG/3ML nebulizer solution 3 mL (has no administration in time range)  ipratropium-albuterol  (DUONEB) 0.5-2.5 (3) MG/3ML nebulizer solution 3 mL (3 mLs Nebulization Given 05/14/24 0924)  methylPREDNISolone  sodium succinate (SOLU-MEDROL ) 125 mg/2 mL injection 125 mg (125 mg Intravenous Given 05/14/24 0924)  ipratropium-albuterol  (DUONEB) 0.5-2.5 (3) MG/3ML nebulizer solution 3 mL (3 mLs Nebulization Given 05/14/24 0924)  albuterol  (PROVENTIL ) (2.5 MG/3ML) 0.083% nebulizer solution 2.5 mg (2.5 mg Nebulization Given 05/14/24 0924)  doxycycline  (VIBRAMYCIN ) 100 mg in sodium chloride  0.9 % 250 mL IVPB (0 mg Intravenous  Stopped 05/14/24 1234)  cefTRIAXone  (ROCEPHIN ) 1 g in sodium chloride  0.9 % 100 mL IVPB (0 g Intravenous Stopped 05/14/24 1038)  albuterol  (PROVENTIL ) (2.5 MG/3ML) 0.083% nebulizer solution 2.5 mg (2.5 mg Nebulization Given 05/14/24 1206)     IMPRESSION / MDM / ASSESSMENT AND PLAN / ED COURSE                                Differential diagnosis includes, but is not limited to, COPD exacerbation, pneumonia, upper respiratory infection, anemia, hypercarbia   ED course: Patient presents acutely with mild to moderate respiratory distress.  Pulmonary exam consistent with bronchospasm.  Patient received IV steroids and breathing treatments x 2 without significant improvement in her thought was ordered.  He is requiring oxygen does not use such at baseline.  We discussed admission at this time with hospitalist.  Of note I did give him a dose of azithromycin  given his COPD exacerbation   Clinical Course as of 05/14/24 1541  Mon May 14, 2024  0949  WBC(!): 19.5 Will initiate treatment for pneumonia community-acquired [HD]  1148 pCO2, Ven(!): 63 Slightly more elevated [HD]  1149 DG Chest 2 View No consult radiation.  Was to a dose of azithromycin  [HD]  1200 I walked into the room to reevaluate the patient and unfortunately patient was satting 87% on room air with a good Plath.  I have placed him on 2 L of O2 will order another Neb but suspect he will need admission today [HD]  1329 Patient accepted for admission to hospitalist [HD]    Clinical Course User Index [HD] Nicholaus Rolland BRAVO, MD     FINAL CLINICAL IMPRESSION(S) / ED DIAGNOSES   Final diagnoses:  COPD exacerbation (HCC)  Hypoxia     Rx / DC Orders   ED Discharge Orders     None        Note:  This document was prepared using Dragon voice recognition software and may include unintentional dictation errors.   Nicholaus Rolland BRAVO, MD 05/14/24 [phone]

## 2024-05-14 NOTE — Assessment & Plan Note (Addendum)
 Patient not on home oxygen supplementation at baseline and patient does not have access to nebulizer treatments at home Long-acting inhaler resumed on admission Incentive spirometry, flutter valve

## 2024-05-14 NOTE — ED Notes (Signed)
 Advised nurse that patient has ready bed

## 2024-05-14 NOTE — ED Notes (Signed)
 MD Nicholaus noticed that pt's SpO2 was 87% on RA with a good pleth, so MD placed pt on 2L [place].

## 2024-05-15 DIAGNOSIS — J449 Chronic obstructive pulmonary disease, unspecified: Secondary | ICD-10-CM

## 2024-05-15 DIAGNOSIS — J9601 Acute respiratory failure with hypoxia: Secondary | ICD-10-CM | POA: Diagnosis not present

## 2024-05-15 LAB — CBC
HCT: 42.3 % (ref 39.0–52.0)
Hemoglobin: 14.3 g/dL (ref 13.0–17.0)
MCH: 31.4 pg (ref 26.0–34.0)
MCHC: 33.8 g/dL (ref 30.0–36.0)
MCV: 92.8 fL (ref 80.0–100.0)
Platelets: 221 K/uL (ref 150–400)
RBC: 4.56 MIL/uL (ref 4.22–5.81)
RDW: 13.1 % (ref 11.5–15.5)
WBC: 18.2 K/uL — ABNORMAL HIGH (ref 4.0–10.5)
nRBC: 0 % (ref 0.0–0.2)

## 2024-05-15 LAB — BASIC METABOLIC PANEL WITH GFR
Anion gap: 14 (ref 5–15)
BUN: 23 mg/dL (ref 8–23)
CO2: 29 mmol/L (ref 22–32)
Calcium: 9.3 mg/dL (ref 8.9–10.3)
Chloride: 100 mmol/L (ref 98–111)
Creatinine, Ser: 0.9 mg/dL (ref 0.61–1.24)
GFR, Estimated: >60 mL/min (ref >60)
Glucose, Bld: 173 mg/dL — ABNORMAL HIGH (ref 70–99)
Potassium: 4.6 mmol/L (ref 3.5–5.1)
Sodium: 143 mmol/L (ref 135–145)

## 2024-05-15 LAB — GLUCOSE, CAPILLARY: Glucose-Capillary: 164 mg/dL — ABNORMAL HIGH (ref 70–99)

## 2024-05-15 MED ORDER — ALBUTEROL SULFATE HFA 108 (90 BASE) MCG/ACT IN AERS: 1.0000 | INHALATION_SPRAY | Freq: Four times a day (QID) | RESPIRATORY_TRACT | 1 refills | Status: DC | PRN

## 2024-05-15 MED ORDER — PREDNISONE 10 MG PO TABS: ORAL_TABLET | ORAL | 0 refills | Status: DC

## 2024-05-15 MED ORDER — DOXYCYCLINE HYCLATE 100 MG PO CAPS: 100.0000 mg | ORAL_CAPSULE | Freq: Two times a day (BID) | ORAL | 0 refills | Status: AC

## 2024-05-15 NOTE — Plan of Care (Signed)
  Problem: Coping: Goal: Ability to adjust to condition or change in health will improve Outcome: Progressing   Problem: Health Behavior/Discharge Planning: Goal: Ability to manage health-related needs will improve Outcome: Progressing   Problem: Skin Integrity: Goal: Risk for impaired skin integrity will decrease Outcome: Progressing   Problem: Tissue Perfusion: Goal: Adequacy of tissue perfusion will improve Outcome: Progressing   Problem: Clinical Measurements: Goal: Ability to maintain clinical measurements within normal limits will improve Outcome: Progressing Goal: Respiratory complications will improve Outcome: Progressing Goal: Cardiovascular complication will be avoided Outcome: Progressing   Problem: Coping: Goal: Level of anxiety will decrease Outcome: Progressing   Problem: Elimination: Goal: Will not experience complications related to bowel motility Outcome: Progressing

## 2024-05-15 NOTE — Discharge Summary (Signed)
 Roy F Myles Jr. FMW:[id number] DOB: [birth year] DOA: 05/14/2024  PCP: [name], NP  Admit date: 05/14/2024 Discharge date: 05/15/2024  Time spent: 35 minutes  Recommendations for Outpatient Follow-up:  Pcp f/u 1 week     Discharge Diagnoses:  Principal Problem:   Acute hypoxic respiratory failure (HCC) Active Problems:   COPD (chronic obstructive pulmonary disease) (HCC)   Essential hypertension   Diabetes mellitus type 2, noninsulin dependent (HCC)   Hyperlipidemia   Aortic atherosclerosis (HCC)   Erectile dysfunction   Coronary artery disease involving native coronary artery of native heart without angina pectoris   Depressive disorder   Post-traumatic stress disorder, unspecified   Leukocytosis   Discharge Condition: improving  Diet recommendation: heart healthy  Filed Weights   05/14/24 0913  Weight: 64.4 kg    History of present illness:  From admission h and p  Roy Richards. is a 63 year old male with history of hypertension, prior cocaine use, prior tobacco use, COPD, hyperlipidemia, hypertension, bipolar type II, ADHD, who presents to the emergency department for chief concerns of shortness of breath.   Vitals in the ED showed T 98.3, rr of 24, heart rate 82, blood pressure 139/54, SpO2 of 90% on room air, patient desatted to 87% on room air and was placed on 2 L nasal cannula.   Serum sodium is 136, potassium 4.0, chloride 95, bicarb 31, BUN of 11, serum creatinine 0.97, EGFR greater than 60, nonfasting blood glucose 143, WBC 19.5, hemoglobin 15.4, platelets 194.  BNP was 93.9, high since he troponin is 13 and on repeat is 11, Pro-Cal was less than 0.10, lactic acid 1.5 and on repeat is 1.3.   COVID/influenza A/influenza B/RSV PCR were negative.   Blood cultures x 2 are in process.   ED treatment: Albuterol  nebulizer x 2, DuoNebs x 3 treatments, ceftriaxone  1 g IV one-time dose, doxycycline  100 mg IV one-time  dose. -------------------------------------- At bedside, patient able to tell me his first and last name, age, location, current calendar year.   Reports that at baseline he produces green-yellow sputum and this is not new.  However over the last week he has been having worsening shortness of breath and increased number of times he coughs.   He reports he does use his baseline inhaler however does not have prescription for nebulizer.  He reports he does not wear oxygen at baseline.   He denies known sick contacts.  He denies chest pain, abdominal pain, dysuria, hematuria, diarrhea.   Social history: He lives at home on his own.  At his peak he was smoking about 15 cigarettes/day and he quit last week.  He reports he will not resume smoking after being discharged from the hospital.  He reports he drinks beer infrequently.  His last drink was 3 beers and that was on Saturday.  At the most on a weekend he will have 6 beers.  He denies recreational drug use.  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  Hospital Course:   Patient presents with 2 weeks worsening cough and shortness of breath. In the ER O2 in the mid-80s, admitted for copd exacerbation with hypoxia. Covid/flu/rsv neg, cxr nothing focal, no significant lab abnormalities. Treated with steroids and antibiotics and oxygen. By hospital day one patient reports feeling 200% better. Self-discontinued oxygen and o2 maintaining 90-91, speaking in complete sentences, ambulating independently, reports feeling much, much better. Will discharge with oral steroids and  antibiotics, standing albuterol  until exacerbation resolves, and advise pcp f/u 1 week. Smoking cessation encouraged.   Procedures: none   Consultations: none  Discharge Exam: Vitals:   05/15/24 0423 05/15/24 0812  BP: 112/65 124/62  Pulse: 64 76  Resp:  16  Temp: 97.6 F (36.4 C) (!) 97.5 F (36.4 C)  SpO2: 94% 94%     General: NAD Cardiovascular: RRR Respiratory: exp wheeze  Discharge Instructions   Discharge Instructions     Diet - low sodium heart healthy   Complete by: As directed    Increase activity slowly   Complete by: As directed       Allergies as of 05/15/2024   No Known Allergies      Medication List     TAKE these medications    albuterol  108 (90 Base) MCG/ACT inhaler Commonly known as: VENTOLIN  HFA Inhale 1-2 puffs into the lungs every 6 (six) hours as needed for wheezing or shortness of breath.   amLODipine  10 MG tablet Commonly known as: NORVASC  TAKE ONE TABLET BY MOUTH DAILY FOR BLOOD PRESSURE   atorvastatin  40 MG tablet Commonly known as: LIPITOR TAKE 1 TABLET BY MOUTH DAILY FOR CHOLESTEROL   citalopram  40 MG tablet Commonly known as: CELEXA  TAKE 1 TABLET BY MOUTH DAILY FOR ANXIETY   doxycycline  100 MG capsule Commonly known as: VIBRAMYCIN  Take 1 capsule (100 mg total) by mouth 2 (two) times daily for 5 days.   fluticasone -salmeterol 250-50 MCG/ACT Aepb Commonly known as: ADVAIR INHALE 1 PUFF BY MOUTH EVERY MORNING AND INHALE 1 PUFF BY MOUTH EVERY EVENING   losartan  50 MG tablet Commonly known as: COZAAR  TAKE 1 TABLET BY MOUTH DAILY FOR BLOOD PRESSURE   metFORMIN  500 MG 24 hr tablet Commonly known as: GLUCOPHAGE -XR Take 1 tablet (500 mg total) by mouth daily with breakfast. for diabetes.   MULTIVITAMIN ADULT PO Take by mouth.   predniSONE  10 MG tablet Commonly known as: DELTASONE  4 tabs daily for 4 days then 3 tabs daily for 2 days then 2 tabs daily for 2 days then 1 tab daily for 2 days What changed:  medication strength additional instructions   sildenafil  100 MG tablet Commonly known as: VIAGRA  Take 1/2 to 1 tablet by mouth 30 min prior to sexual activity   Vitamin D 50 MCG (2000 UT) tablet Take 2,000 Units by mouth daily.       No Known Allergies  Follow-up Information     [name], NP Follow up.   Specialty:  Internal Medicine Why: in 1 week Contact information: [address] [name] [address] [phone]                  The results of significant diagnostics from this hospitalization (including imaging, microbiology, ancillary and laboratory) are listed below for reference.    Significant Diagnostic Studies: DG Chest 2 View Result Date: 05/14/2024 CLINICAL DATA:  63 year old male with chest pain and shortness of breath. Former smoker. EXAM: CHEST - 2 VIEW COMPARISON:  Screening chest CT 05/23/2023 and earlier. FINDINGS: AP and lateral views 0934 hours. Chronic large lung volumes and coarse bilateral pulmonary interstitial opacity. Mild but increased patchy opacity at the right lung base on the PA view, not correlated on the lateral. No pneumothorax, pulmonary edema, pleural effusion or consolidation. Normal cardiac size and mediastinal contours. Visualized tracheal air column is within normal limits. Osteopenia. No acute osseous abnormality identified. Negative visible bowel gas. IMPRESSION: Chronic lung disease with hyperinflation, and  evidence of acute infectious exacerbation at the right lung base. No consolidation or effusion. Electronically Signed   By: VEAR Hurst M.D.   On: 05/14/2024 09:49    Microbiology: Recent Results (from the past 240 hours)  Resp panel by RT-PCR (RSV, Flu A&B, Covid) Anterior Nasal Swab     Status: None   Collection Time: 05/14/24  9:27 AM   Specimen: Anterior Nasal Swab  Result Value Ref Range Status   SARS Coronavirus 2 by RT PCR NEGATIVE NEGATIVE Final    Comment: (NOTE) SARS-CoV-2 target nucleic acids are NOT DETECTED.  The SARS-CoV-2 RNA is generally detectable in upper respiratory specimens during the acute phase of infection. The lowest concentration of SARS-CoV-2 viral copies this assay can detect is 138 copies/mL. A negative result does not preclude SARS-Cov-2 infection and should not be used as the sole basis for treatment or other  patient management decisions. A negative result may occur with  improper specimen collection/handling, submission of specimen other than nasopharyngeal swab, presence of viral mutation(s) within the areas targeted by this assay, and inadequate number of viral copies(<138 copies/mL). A negative result must be combined with clinical observations, patient history, and epidemiological information. The expected result is Negative.  Fact Sheet for Patients:  BloggerCourse.com  Fact Sheet for Healthcare Providers:  SeriousBroker.it  This test is no t yet approved or cleared by the [place]  FDA and  has been authorized for detection and/or diagnosis of SARS-CoV-2 by FDA under an Emergency Use Authorization (EUA). This EUA will remain  in effect (meaning this test can be used) for the duration of the COVID-19 declaration under Section 564(b)(1) of the Act, 21 U.S.C.section 360bbb-3(b)(1), unless the authorization is terminated  or revoked sooner.       Influenza A by PCR NEGATIVE NEGATIVE Final   Influenza B by PCR NEGATIVE NEGATIVE Final    Comment: (NOTE) The Xpert Xpress SARS-CoV-2/FLU/RSV plus assay is intended as an aid in the diagnosis of influenza from Nasopharyngeal swab specimens and should not be used as a sole basis for treatment. Nasal washings and aspirates are unacceptable for Xpert Xpress SARS-CoV-2/FLU/RSV testing.  Fact Sheet for Patients: BloggerCourse.com  Fact Sheet for Healthcare Providers: SeriousBroker.it  This test is not yet approved or cleared by the [place]  FDA and has been authorized for detection and/or diagnosis of SARS-CoV-2 by FDA under an Emergency Use Authorization (EUA). This EUA will remain in effect (meaning this test can be used) for the duration of the COVID-19 declaration under Section 564(b)(1) of the Act, 21 U.S.C. section  360bbb-3(b)(1), unless the authorization is terminated or revoked.     Resp Syncytial Virus by PCR NEGATIVE NEGATIVE Final    Comment: (NOTE) Fact Sheet for Patients: BloggerCourse.com  Fact Sheet for Healthcare Providers: SeriousBroker.it  This test is not yet approved or cleared by the [place]  FDA and has been authorized for detection and/or diagnosis of SARS-CoV-2 by FDA under an Emergency Use Authorization (EUA). This EUA will remain in effect (meaning this test can be used) for the duration of the COVID-19 declaration under Section 564(b)(1) of the Act, 21 U.S.C. section 360bbb-3(b)(1), unless the authorization is terminated or revoked.  Performed at [facility], [address] [place], [place] [address]   Blood culture (routine x 2)     Status: None (Preliminary result)   Collection Time: 05/14/24 10:07 AM   Specimen: BLOOD  Result Value Ref Range Status   Specimen Description BLOOD BLOOD LEFT FOREARM  Final  Special Requests   Final    BOTTLES DRAWN AEROBIC AND ANAEROBIC Blood Culture results may not be optimal due to an inadequate volume of blood received in culture bottles   Culture   Final    NO GROWTH < 24 HOURS Performed at [facility], [address] [place], [place] [address]    Report Status PENDING  Incomplete  Blood culture (routine x 2)     Status: None (Preliminary result)   Collection Time: 05/14/24 10:07 AM   Specimen: BLOOD  Result Value Ref Range Status   Specimen Description BLOOD BLOOD RIGHT FOREARM  Final   Special Requests   Final    BOTTLES DRAWN AEROBIC AND ANAEROBIC Blood Culture adequate volume   Culture   Final    NO GROWTH < 24 HOURS Performed at [facility], [address], [place], [place] [address]    Report Status PENDING  Incomplete     Labs: Basic Metabolic Panel: Recent Labs  Lab 05/14/24 0918 05/15/24 0321  NA 136 143  K 4.0 4.6   CL 95* 100  CO2 31 29  GLUCOSE 143* 173*  BUN 11 23  CREATININE 0.97 0.90  CALCIUM  9.1 9.3   Liver Function Tests: No results for input(s): AST, ALT, ALKPHOS, BILITOT, PROT, ALBUMIN in the last 168 hours. No results for input(s): LIPASE, AMYLASE in the last 168 hours. No results for input(s): AMMONIA in the last 168 hours. CBC: Recent Labs  Lab 05/14/24 0918 05/15/24 0321  WBC 19.5* 18.2*  HGB 15.4 14.3  HCT 46.6 42.3  MCV 93.2 92.8  PLT 194 221   Cardiac Enzymes: No results for input(s): CKTOTAL, CKMB, CKMBINDEX, TROPONINI in the last 168 hours. BNP: BNP (last 3 results) Recent Labs    05/14/24 0918  BNP 93.9    ProBNP (last 3 results) No results for input(s): PROBNP in the last 8760 hours.  CBG: Recent Labs  Lab 05/14/24 1720 05/14/24 2131 05/15/24 0813  GLUCAP 258* 192* 164*       Signed:  Devaughn KATHEE Ban MD.  [facility] 05/15/2024, 8:55 AM

## 2024-05-15 NOTE — Progress Notes (Signed)
 [name], Roy Richards. is a 63 year old male alert and oriented, in room 124 he non adherent with care this shift. Refused HS atorvastatin  and Lovenox . Currently has taken off cardiac monitor and and refusing Q4hr VS. Madison FELIX MD. [facility] notified of pt refusal.

## 2024-05-16 ENCOUNTER — Telehealth: Payer: Self-pay

## 2024-05-16 ENCOUNTER — Ambulatory Visit: Payer: Self-pay

## 2024-05-16 ENCOUNTER — Other Ambulatory Visit: Payer: Self-pay

## 2024-05-16 ENCOUNTER — Emergency Department

## 2024-05-16 ENCOUNTER — Emergency Department
Admission: EM | Admit: 2024-05-16 | Discharge: 2024-05-16 | Discharge status: Against Medical Advice | Attending: Student | Admitting: Student

## 2024-05-16 DIAGNOSIS — Z5321 Procedure and treatment not carried out due to patient leaving prior to being seen by health care provider: Secondary | ICD-10-CM | POA: Insufficient documentation

## 2024-05-16 DIAGNOSIS — J449 Chronic obstructive pulmonary disease, unspecified: Secondary | ICD-10-CM | POA: Insufficient documentation

## 2024-05-16 DIAGNOSIS — J441 Chronic obstructive pulmonary disease with (acute) exacerbation: Secondary | ICD-10-CM | POA: Diagnosis not present

## 2024-05-16 DIAGNOSIS — R0602 Shortness of breath: Secondary | ICD-10-CM | POA: Diagnosis not present

## 2024-05-16 DIAGNOSIS — R1011 Right upper quadrant pain: Secondary | ICD-10-CM | POA: Diagnosis not present

## 2024-05-16 LAB — URINALYSIS, ROUTINE W REFLEX MICROSCOPIC
Bilirubin Urine: NEGATIVE
Glucose, UA: NEGATIVE mg/dL
Hgb urine dipstick: NEGATIVE
Ketones, ur: NEGATIVE mg/dL
Leukocytes,Ua: NEGATIVE
Nitrite: NEGATIVE
Protein, ur: NEGATIVE mg/dL
Specific Gravity, Urine: 1.02 (ref 1.005–1.030)
pH: 5 (ref 5.0–8.0)

## 2024-05-16 NOTE — ED Provider Triage Note (Addendum)
 Emergency Medicine Provider Triage Evaluation Note  Roy Richards. , a 63 y.o. male  was evaluated in triage.  Pt complains of right upper abdominal pain.  Patient endorses he was discharged from the hospital for COPD, patient is taking the antibiotics prescribed.  Patient denies vomiting, nauseous, diarrhea, anorexia.  Pain is not related with ingestion.  Per independent chart review patient was treated for acute infectious exacerbation of the right lung base, no consolidation or effusion.   Review of Systems  Positive: Negative:   Physical Exam  BP 132/62   Pulse 93   Temp 99.7 F (37.6 C) (Oral)   Resp 19   Ht 5' 5 (1.651 m)   Wt 64.4 kg   SpO2 91%   BMI 23.63 kg/m during triage vital signs were normal, patient is afebrile, normotensive. Gen:   Awake, no distress   Resp:  Normal effort MSK:   Moves extremities without difficulty  Other:  Abdomen: Tender to palpation in right upper quadrant.  Medical Decision Making  Medically screening exam initiated at 3:52 PM.  Appropriate orders placed.  Roy Richards. was informed that the remainder of the evaluation will be completed by another provider, this initial triage assessment does not replace that evaluation, and the importance of remaining in the ED until their evaluation is complete.  Patient who presents with right upper quadrant pain described as a spasmodic pain not related with food.  Patient was discharged 2 days ago with diagnosis of COPD exacerbation.  Patient is taking antibiotics as prescribed.  Ordered chest x-ray   Janit Kast, PA-C 05/16/24 1554    Janit Kast, PA-C 05/16/24 1556

## 2024-05-16 NOTE — ED Triage Notes (Signed)
 Pt discharged from inpatient admission yesterday for COPD exacerbation. Pt had follow up phone call and was told to come back to be evaluated. Pt reports getting short of breath when eating or exerting. Pt reports deep flank pain as well.

## 2024-05-16 NOTE — Telephone Encounter (Signed)
 FYI Only or Action Required?: Action required by provider: update on patient condition.  Patient was last seen in primary care on 12/16/2023 by [name], NP.  Called Nurse Triage reporting Respiratory Distress.  Symptoms began today.  Interventions attempted: Prescription medications: albuterol  mdi.  Symptoms are: unchanged.  Triage Disposition: Go to ED Now (or PCP Triage)  Patient/caregiver understands and will follow disposition?: Unsure  Reason for Disposition  [1] SEVERE weakness (e.g., can't stand or can barely walk) AND [2] new-onset or worse than when discharged from hospital  Answer Assessment - Initial Assessment Questions Patient would like a nebulizer and medication to put in nebulizer   Using inhaler at home provides little relief from SOB Reports that he is taking prednisone  and antibiotic  Also spoke to RN, Philippe,  in clinic who recommends ED  Patient disconnected phone call after recommendation, attempted to call, left voicemail  1. MAIN CONCERN OR SYMPTOM: What's the main symptom you're worried about? (e.g., breathing problem, cough, sputum change, wheezing) What question do you have?     Feels like symptoms that lead to hospitalization  are coming back.  Feels right lower lung pain and shortness of breath 2. ONSET: When did the  symptoms  start?      0730 this morning 3. BETTER-SAME-WORSE: Are you getting better, staying the same, or getting worse compared to the day you were discharged?     Condition not worsening as day progresses 4. DISCHARGE DATE: What date were you discharged from the hospital?     05/15/2024 5. DISCHARGE DOCTOR: Who is the main doctor taking care of you now?     N/A 6. DISCHARGE APPOINTMENT: Have you scheduled a follow-up discharge appointment with your doctor?     Follow up scheduled 7. DISCHARGE MEDICINES: Did the doctor who discharged you order any new medicines for you to use? If yes, have you filled the  prescription and started taking the medicine?     Doxycycline , albuterol  MDI, prednisone  8. OXYGEN THERAPY:      Not prescribed 9. MONITORING Do you monitor/measure your oxygen level or vital signs (e.g., yes, no, measurements are automatically sent to call center). Document current and normal values if available.       Spo2 88% at time of call 10. BREATHING DIFFICULTY: Are you having any trouble breathing? If Yes, ask: How bad is it?  (e.g., none, mild, moderate, severe)        Short of breath with activity 11. FEVER: Do you have a fever? If Yes, ask: What is your temperature, how was it measured, and when did it start?       denies 12. SPUTUM: Describe the color of your sputum (phlegm). (clear, white, yellow, green, blood-tinged)       No cough since D/C  13. OTHER SYMPTOMS: Do you have any other symptoms? (e.g., fever)       Energy needed to chew food takes his breath 14. SMOKING: Do you smoke currently?       Denies, no one smokes around him  Protocols used: COPD Post-Hospitalization Follow-up Call-A-[facility]

## 2024-05-16 NOTE — ED Notes (Signed)
 Pt ambulatory to STAT desk to inform this nurse he has reviewed his MyChart results and discussed them with his daughter; st leaving now; pt encouraged to stay and be evaluated further but pt declines at this time; informed pt to f/u with PCP in the morning regarding his previous visit and today's visit and to return for any new or worsening symptoms; pt voices good understanding

## 2024-05-16 NOTE — Telephone Encounter (Signed)
 Noted. Patient currently in the ED. Will await notes.

## 2024-05-16 NOTE — Transitions of Care (Post Inpatient/ED Visit) (Signed)
 05/16/2024  Name: Roy Richards. MRN: [id number] DOB: 05/22/1961  Today's TOC FU Call Status: Today's TOC FU Call Status:: Successful TOC FU Call Completed TOC FU Call Complete Date: 05/16/24 Patient's Name and Date of Birth confirmed.  Transition Care Management Follow-up Telephone Call Date of Discharge: 05/15/24 Discharge Facility: [facility]) Type of Discharge: Inpatient Admission Primary Inpatient Discharge Diagnosis:: COPD How have you been since you were released from the hospital?: Better Any questions or concerns?: No  Items Reviewed: Did you receive and understand the discharge instructions provided?: Yes Medications obtained,verified, and reconciled?: Yes (Medications Reviewed) Any new allergies since your discharge?: No Dietary orders reviewed?: Yes Do you have support at home?: Yes People in Home [RPT]: friend(s)  Medications Reviewed Today: Medications Reviewed Today     Reviewed by Emmitt Pan, LPN (Licensed Practical Nurse) on 05/16/24 at 1515  Med List Status: <None>   Medication Order Taking? Sig Documenting Provider Last Dose Status Informant  albuterol  (VENTOLIN  HFA) 108 (90 Base) MCG/ACT inhaler [id number] Yes Inhale 1-2 puffs into the lungs every 6 (six) hours as needed for wheezing or shortness of breath. Wouk, Devaughn Sayres, MD  Active   amLODipine  (NORVASC ) 10 MG tablet [id number] Yes TAKE ONE TABLET BY MOUTH DAILY FOR BLOOD PRESSURE Clark, Katherine K, NP  Active Self  atorvastatin  (LIPITOR) 40 MG tablet [id number] Yes TAKE 1 TABLET BY MOUTH DAILY FOR CHOLESTEROL Clark, Katherine K, NP  Active Self  Cholecalciferol (VITAMIN D) 50 MCG (2000 UT) tablet [id number] Yes Take 2,000 Units by mouth daily. [provider]  Active Self  citalopram  (CELEXA ) 40 MG tablet [id number] Yes TAKE 1 TABLET BY MOUTH DAILY FOR ANXIETY Clark, Katherine K, NP  Active Self  doxycycline  (VIBRAMYCIN ) 100 MG capsule [id number] Yes Take 1 capsule  (100 mg total) by mouth 2 (two) times daily for 5 days. Wouk, Devaughn Sayres, MD  Active   fluticasone -salmeterol (ADVAIR) 250-50 MCG/ACT AEPB [id number] Yes INHALE 1 PUFF BY MOUTH EVERY MORNING AND INHALE 1 PUFF BY MOUTH EVERY EVENING Clark, Katherine K, NP  Active Self  losartan  (COZAAR ) 50 MG tablet [id number] Yes TAKE 1 TABLET BY MOUTH DAILY FOR BLOOD PRESSURE Clark, Katherine K, NP  Active Self  metFORMIN  (GLUCOPHAGE -XR) 500 MG 24 hr tablet [id number] Yes Take 1 tablet (500 mg total) by mouth daily with breakfast. for diabetes. Clark, Katherine K, NP  Active Self  Multiple Vitamins-Minerals (MULTIVITAMIN ADULT PO) [id number] Yes Take by mouth. [provider]  Active Self  predniSONE  (DELTASONE ) 10 MG tablet [id number] Yes 4 tabs daily for 4 days then 3 tabs daily for 2 days then 2 tabs daily for 2 days then 1 tab daily for 2 days Wouk, Devaughn Sayres, MD  Active   sildenafil  (VIAGRA ) 100 MG tablet [id number] Yes Take 1/2 to 1 tablet by mouth 30 min prior to sexual activity [name], NP  Active Self            Home Care and Equipment/Supplies: Were Home Health Services Ordered?: NA Any new equipment or medical supplies ordered?: NA  Functional Questionnaire: Do you need assistance with bathing/showering or dressing?: No Do you need assistance with meal preparation?: No Do you need assistance with eating?: No Do you have difficulty maintaining continence: No Do you need assistance with getting out of bed/getting out of a chair/moving?: No Do you have difficulty managing or taking your medications?: No  Follow up appointments reviewed: PCP Follow-up appointment confirmed?: Yes Date of PCP  follow-up appointment?: 05/22/24 Follow-up Provider: [facility] Follow-up appointment confirmed?: NA Do you need transportation to your follow-up appointment?: No Do you understand care options if your condition(s) worsen?: Yes-patient verbalized  understanding    SIGNATURE Julian Lemmings, LPN [facility] Nurse Health Advisor Direct Dial [phone]

## 2024-05-17 MED ORDER — ALBUTEROL SULFATE (2.5 MG/3ML) 0.083% IN NEBU: 2.5000 mg | INHALATION_SOLUTION | Freq: Four times a day (QID) | RESPIRATORY_TRACT | 0 refills | Status: AC | PRN

## 2024-05-17 NOTE — Telephone Encounter (Signed)
 Kelli, can you coordinate with patient to get one of our nebulizer machines?

## 2024-05-17 NOTE — Telephone Encounter (Signed)
 See other MyChart message

## 2024-05-18 DIAGNOSIS — J449 Chronic obstructive pulmonary disease, unspecified: Secondary | ICD-10-CM | POA: Diagnosis not present

## 2024-05-18 DIAGNOSIS — J441 Chronic obstructive pulmonary disease with (acute) exacerbation: Secondary | ICD-10-CM | POA: Diagnosis not present

## 2024-05-18 NOTE — Telephone Encounter (Signed)
 Spoke with patient, he will come pick up nebulizer today.

## 2024-05-19 LAB — CULTURE, BLOOD (ROUTINE X 2)
Culture: NO GROWTH
Culture: NO GROWTH
Special Requests: ADEQUATE

## 2024-05-22 ENCOUNTER — Ambulatory Visit (INDEPENDENT_AMBULATORY_CARE_PROVIDER_SITE_OTHER): Admitting: Primary Care

## 2024-05-22 ENCOUNTER — Encounter: Payer: Self-pay | Admitting: Primary Care

## 2024-05-22 VITALS — BP 128/64 | HR 68 | Temp 97.8°F | Ht 65.0 in | Wt 144.0 lb

## 2024-05-22 DIAGNOSIS — J449 Chronic obstructive pulmonary disease, unspecified: Secondary | ICD-10-CM

## 2024-05-22 NOTE — Assessment & Plan Note (Signed)
 With recent exacerbation. Hospital notes, labs, imaging reviewed.  Recommended pulmonology referral for which he declines. Continue Advair 250-50 mcg, 1 puff twice daily for maintenance and albuterol  inhaler/nebulizer treatments as needed.  He will update if symptoms do not continue to improve. He will continue to monitor his oxygen saturation and report lower levels.  Close follow-up in 3 to 6 months based on his stability.

## 2024-05-22 NOTE — Progress Notes (Signed)
 Subjective:    Patient ID: Roy JULIANNA Kurt Mickey., male    DOB: 1961/05/14, 63 y.o.   MRN: [id number]  Roy Kilmer. is a very pleasant 63 y.o. male with a history of aortic atherosclerosis, hypertension, CAD, COPD, acute hypoxic respiratory failure, type 2 diabetes, lipidemia who presents today for hospital follow-up.  He presented to [facility] ED on 05/14/2024 for 3 day history of dyspnea.  He was in mild to moderate respiratory distress with hypoxia.  During his stay in the ED he was treated with azithromycin  and supplemental oxygen.  Labs revealed leukocytosis with white blood cell count of 19.5 and pCO2 of 63.  Chest x-ray was negative for pneumonia. He was admitted for further evaluation.  During his hospital stay he tested negative for influenza/RSV/COVID-19 infections.  He was treated with nebulizer steroids/S[place], IV ceftriaxone , IV doxycycline .  Symptoms improved so he was discharged home on 05/15/2024 with prescriptions for prednisone  and doxycycline .  He presented back to [facility] ED on 05/16/2024 for dyspnea and flank pain. He left without being seen due to long wait times  Today he is feeling better. He continues on prednisone  but finished his doxycycline . He does experience shortness of breath with mild exertion. He's used his albuterol  nebulizer with some improvement. He monitors his oxygen saturation which runs 88-92%. He quit smoking a few weeks ago. He believes his recent exacerbation came from resuming cigarettes. He has supplemental oxygen at home for which he uses as needed. He does not follow with an allergy and is not interested at this time.  He feels well-managed on his Advair for maintenance.   Review of Systems  Constitutional:  Negative for fever.  Respiratory:  Positive for shortness of breath.   Cardiovascular:  Negative for chest pain.  Skin:  Negative for color change.         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   Emphysema of lung (HCC) 2015   History of colon polyps 06/17/2023   Hyperlipidemia    Hypertension    Hypoxia 05/07/2020   Uncomplicated alcohol dependence (HCC) 09/12/2019    Social History   Socioeconomic History   Marital status: Divorced    Spouse name: Not on file   Number of children: Not on file   Years of education: Not on file   Highest education level: GED or equivalent  Occupational History   Not on file  Tobacco Use   Smoking status: Former    Current packs/day: 0.50    Average packs/day: 0.5 packs/day for 25.9 years (13.0 ttl pk-yrs)    Types: Cigarettes    Start date: 12/15/2022   Smokeless tobacco: Never  Vaping Use   Vaping status: Never Used  Substance and Sexual Activity   Alcohol use: Yes    Comment: occasionally   Drug use: Never   Sexual activity: Yes  Other Topics Concern   Not on file  Social History Narrative   Not on file   Social Drivers of Health   Financial Resource Strain: Low Risk  (02/02/2024)   Overall Financial Resource Strain (CARDIA)  Difficulty of Paying Living Expenses: Not hard at all  Recent Concern: Financial Resource Strain - Medium Risk (12/12/2023)   Overall Financial Resource Strain (CARDIA)    Difficulty of Paying Living Expenses: Somewhat hard  Food Insecurity: No Food Insecurity (05/14/2024)   Hunger Vital Sign    Worried About Running Out of Food in the Last Year: Never true    Ran Out of Food in the Last Year: Never true  Transportation Needs: No Transportation Needs (05/14/2024)   PRAPARE - Administrator, Civil Service (Medical): No    Lack of Transportation (Non-Medical): No  Physical  Activity: Insufficiently Active (02/02/2024)   Exercise Vital Sign    Days of Exercise per Week: 4 days    Minutes of Exercise per Session: 30 min  Stress: No Stress Concern Present (02/02/2024)   [employer] of Occupational Health - Occupational Stress Questionnaire    Feeling of Stress : Not at all  Social Connections: Socially Isolated (02/02/2024)   Social Connection and Isolation Panel    Frequency of Communication with Friends and Family: More than three times a week    Frequency of Social Gatherings with Friends and Family: Once a week    Attends Religious Services: Never    Database administrator or Organizations: No    Attends Banker Meetings: Never    Marital Status: Divorced  Catering manager Violence: Not At Risk (05/14/2024)   Humiliation, Afraid, Rape, and Kick questionnaire    Fear of Current or Ex-Partner: No    Emotionally Abused: No    Physically Abused: No    Sexually Abused: No    Past Surgical History:  Procedure Laterality Date   BIOPSY  08/09/2023   Procedure: BIOPSY;  Surgeon: Therisa Bi, MD;  Location: [facility] ENDOSCOPY;  Service: Gastroenterology;;   COLONOSCOPY WITH PROPOFOL  N/A 08/09/2023   Procedure: COLONOSCOPY WITH PROPOFOL ;  Surgeon: Therisa Bi, MD;  Location: [facility] ENDOSCOPY;  Service: Gastroenterology;  Laterality: N/A;   HERNIA REPAIR     POLYPECTOMY  08/09/2023   Procedure: POLYPECTOMY;  Surgeon: Therisa Bi, MD;  Location: [facility] ENDOSCOPY;  Service: Gastroenterology;;    Family History  Problem Relation Age of Onset   COPD Mother    Heart attack Father 69   Heart disease Father    Hyperlipidemia Father    Hypertension Father     No Known Allergies  Current Outpatient Medications on File Prior to Visit  Medication Sig Dispense Refill   albuterol  (PROVENTIL ) (2.5 MG/3ML) 0.083% nebulizer solution Take 3 mLs (2.5 mg total) by nebulization every 6 (six) hours as needed for wheezing or shortness of breath. 150 mL 0    albuterol  (VENTOLIN  HFA) 108 (90 Base) MCG/ACT inhaler Inhale 1-2 puffs into the lungs every 6 (six) hours as needed for wheezing or shortness of breath. 18 g 1   amLODipine  (NORVASC ) 10 MG tablet TAKE ONE TABLET BY MOUTH DAILY FOR BLOOD PRESSURE 90 tablet 2   atorvastatin  (LIPITOR) 40 MG tablet TAKE 1 TABLET BY MOUTH DAILY FOR CHOLESTEROL 90 tablet 2   Cholecalciferol (VITAMIN D) 50 MCG (2000 UT) tablet Take 2,000 Units by mouth daily.     citalopram  (CELEXA ) 40 MG tablet TAKE 1 TABLET BY MOUTH DAILY FOR ANXIETY 90 tablet 2   fluticasone -salmeterol (ADVAIR) 250-50 MCG/ACT AEPB INHALE 1 PUFF BY MOUTH EVERY MORNING AND INHALE 1 PUFF BY MOUTH EVERY EVENING 180 each 2   losartan  (COZAAR ) 50 MG tablet TAKE 1 TABLET BY MOUTH  DAILY FOR BLOOD PRESSURE 90 tablet 2   metFORMIN  (GLUCOPHAGE -XR) 500 MG 24 hr tablet Take 1 tablet (500 mg total) by mouth daily with breakfast. for diabetes. 90 tablet 1   Multiple Vitamins-Minerals (MULTIVITAMIN ADULT PO) Take by mouth.     predniSONE  (DELTASONE ) 10 MG tablet 4 tabs daily for 4 days then 3 tabs daily for 2 days then 2 tabs daily for 2 days then 1 tab daily for 2 days 28 tablet 0   sildenafil  (VIAGRA ) 100 MG tablet Take 1/2 to 1 tablet by mouth 30 min prior to sexual activity 30 tablet 0   No current facility-administered medications on file prior to visit.    BP 128/64   Pulse 68   Temp 97.8 F (36.6 C) (Temporal)   Ht 5' 5 (1.651 m)   Wt 144 lb (65.3 kg)   SpO2 91%   BMI 23.96 kg/m  Objective:   Physical Exam Cardiovascular:     Rate and Rhythm: Normal rate and regular rhythm.  Pulmonary:     Effort: Pulmonary effort is normal.     Breath sounds: Normal breath sounds.  Musculoskeletal:     Cervical back: Neck supple.  Skin:    General: Skin is warm and dry.  Neurological:     Mental Status: He is alert and oriented to person, place, and time.  Psychiatric:        Mood and Affect: Mood normal.     Physical Exam        Assessment &  Plan:  Chronic obstructive pulmonary disease, unspecified COPD type (HCC) Assessment & Plan: With recent exacerbation. Hospital notes, labs, imaging reviewed.  Recommended pulmonology referral for which he declines. Continue Advair 250-50 mcg, 1 puff twice daily for maintenance and albuterol  inhaler/nebulizer treatments as needed.  He will update if symptoms do not continue to improve. He will continue to monitor his oxygen saturation and report lower levels.  Close follow-up in 3 to 6 months based on his stability.     Assessment and Plan Assessment & Plan         Comer MARLA Gaskins, NP     History of Present Illness

## 2024-05-22 NOTE — Patient Instructions (Signed)
 Continue the Advair inhaler every day for COPD.  Continue the nebulizer treatments as needed.  Please notify me if anything changes.  It was a pleasure to see you today!

## 2024-05-29 ENCOUNTER — Inpatient Hospital Stay: Admitting: Primary Care

## 2024-06-05 DIAGNOSIS — N529 Male erectile dysfunction, unspecified: Secondary | ICD-10-CM

## 2024-06-06 MED ORDER — SILDENAFIL CITRATE 100 MG PO TABS: ORAL_TABLET | ORAL | 0 refills | Status: DC

## 2024-06-17 DIAGNOSIS — J441 Chronic obstructive pulmonary disease with (acute) exacerbation: Secondary | ICD-10-CM | POA: Diagnosis not present

## 2024-06-17 DIAGNOSIS — J449 Chronic obstructive pulmonary disease, unspecified: Secondary | ICD-10-CM | POA: Diagnosis not present

## 2024-06-21 ENCOUNTER — Ambulatory Visit: Admitting: Primary Care

## 2024-06-21 ENCOUNTER — Encounter: Payer: Self-pay | Admitting: Primary Care

## 2024-06-21 ENCOUNTER — Ambulatory Visit: Payer: Self-pay | Admitting: Primary Care

## 2024-06-21 VITALS — BP 134/62 | HR 73 | Temp 97.7°F | Ht 65.0 in | Wt 147.0 lb

## 2024-06-21 DIAGNOSIS — E1159 Type 2 diabetes mellitus with other circulatory complications: Secondary | ICD-10-CM

## 2024-06-21 DIAGNOSIS — Z7984 Long term (current) use of oral hypoglycemic drugs: Secondary | ICD-10-CM

## 2024-06-21 LAB — POCT GLYCOSYLATED HEMOGLOBIN (HGB A1C): Hemoglobin A1C: 6.2 % — AB (ref 4.0–5.6)

## 2024-06-21 LAB — MICROALBUMIN / CREATININE URINE RATIO
Creatinine,U: 134.9 mg/dL
Microalb Creat Ratio: 14.4 mg/g (ref 0.0–30.0)
Microalb, Ur: 1.9 mg/dL (ref 0.0–1.9)

## 2024-06-21 NOTE — Patient Instructions (Signed)
 Stop by the lab prior to leaving today. I will notify you of your results once received.   Please schedule a physical to meet with me in 6 months.   It was a pleasure to see you today!

## 2024-06-21 NOTE — Assessment & Plan Note (Signed)
 Controlled with A1c of 6.2 today.  Continue metformin  ER 500 mg daily. Foot exam today. Urine microalbumin due and pending.  Follow-up in 6 months.

## 2024-06-21 NOTE — Progress Notes (Signed)
 Subjective:    Patient ID: Roy JULIANNA Kurt Mickey., male    DOB: 30-Sep-1960, 63 y.o.   MRN: [id number]  Roy Drew. is a very pleasant 63 y.o. male with a history of CAD, COPD, type 2 diabetes, hyperlipidemia who presents today for follow-up of diabetes.  Current medications include: Metformin  ER 500 mg daily.  He is checking his blood glucose 0 times daily.  Last A1C: 6.3 in April 2025, 6.2 today Last Eye Exam: Up-to-date Last Foot Exam: Due Pneumonia Vaccination: 2022 Urine Microalbumin: Due Statin: Atorvastatin   Dietary changes since last visit: Home cooked meals. Increased intake of candy bars.    Exercise: Active at home  BP Readings from Last 3 Encounters:  06/21/24 134/62  05/22/24 128/64  05/16/24 132/62       Review of Systems  Eyes:  Negative for visual disturbance.  Cardiovascular:  Negative for chest pain.  Gastrointestinal:  Negative for abdominal pain and diarrhea.  Neurological:  Negative for dizziness and numbness.         Past Medical History:  Diagnosis Date   Acute colitis 06/26/2019   Anxiety 2021   Building collapse   Asthma    Attention deficit hyperactivity disorder, combined type 09/12/2019   Bipolar II disorder (HCC) 09/12/2019   Cocaine dependence in remission (HCC) 09/12/2019   Contusion of abdominal wall 06/13/2023   Depression    Diabetes mellitus without complication (HCC)    Diverticular disease of colon 06/17/2023   Emphysema of lung (HCC) 2015   History of colon polyps 06/17/2023   Hyperlipidemia    Hypertension    Hypoxia 05/07/2020   Uncomplicated alcohol dependence (HCC) 09/12/2019    Social History   Socioeconomic History   Marital status: Divorced    Spouse name: Not on file   Number of children: Not on file   Years of education: Not on file   Highest education level: GED or equivalent  Occupational History   Not on file  Tobacco Use   Smoking status: Former    Current packs/day: 0.50    Average packs/day:  0.5 packs/day for 26.0 years (13.0 ttl pk-yrs)    Types: Cigarettes    Start date: 12/15/2022   Smokeless tobacco: Never  Vaping Use   Vaping status: Never Used  Substance and Sexual Activity   Alcohol use: Yes    Comment: occasionally   Drug use: Never   Sexual activity: Yes  Other Topics Concern   Not on file  Social History Narrative   Not on file   Social Drivers of Health   Financial Resource Strain: Low Risk  (06/20/2024)   Overall Financial Resource Strain (CARDIA)    Difficulty of Paying Living Expenses: Not hard at all  Food Insecurity: No Food Insecurity (06/20/2024)   Hunger Vital Sign    Worried About Running Out of Food in the Last Year: Never true    Ran Out of Food in the Last Year: Never true  Transportation Needs: Unknown (06/20/2024)   PRAPARE - Administrator, Civil Service (Medical): No    Lack of Transportation (Non-Medical): Not on file  Physical Activity: Sufficiently Active (06/20/2024)   Exercise Vital Sign    Days of Exercise per Week: 4 days    Minutes of Exercise per Session: 60 min  Stress: Stress Concern Present (06/20/2024)   [employer] of Occupational Health - Occupational Stress Questionnaire    Feeling of Stress: To some extent  Social Connections: Socially  Isolated (06/20/2024)   Social Connection and Isolation Panel    Frequency of Communication with Friends and Family: More than three times a week    Frequency of Social Gatherings with Friends and Family: Three times a week    Attends Religious Services: Never    Active Member of Clubs or Organizations: No    Attends Banker Meetings: Not on file    Marital Status: Divorced  Intimate Partner Violence: Not At Risk (05/14/2024)   Humiliation, Afraid, Rape, and Kick questionnaire    Fear of Current or Ex-Partner: No    Emotionally Abused: No    Physically Abused: No    Sexually Abused: No    Past Surgical History:  Procedure Laterality Date   BIOPSY   08/09/2023   Procedure: BIOPSY;  Surgeon: Therisa Bi, MD;  Location: [facility] ENDOSCOPY;  Service: Gastroenterology;;   COLONOSCOPY WITH PROPOFOL  N/A 08/09/2023   Procedure: COLONOSCOPY WITH PROPOFOL ;  Surgeon: Therisa Bi, MD;  Location: [facility] ENDOSCOPY;  Service: Gastroenterology;  Laterality: N/A;   HERNIA REPAIR     POLYPECTOMY  08/09/2023   Procedure: POLYPECTOMY;  Surgeon: Therisa Bi, MD;  Location: [facility] ENDOSCOPY;  Service: Gastroenterology;;    Family History  Problem Relation Age of Onset   COPD Mother    Heart attack Father 50   Heart disease Father    Hyperlipidemia Father    Hypertension Father     No Known Allergies  Current Outpatient Medications on File Prior to Visit  Medication Sig Dispense Refill   albuterol  (PROVENTIL ) (2.5 MG/3ML) 0.083% nebulizer solution Take 3 mLs (2.5 mg total) by nebulization every 6 (six) hours as needed for wheezing or shortness of breath. 150 mL 0   albuterol  (VENTOLIN  HFA) 108 (90 Base) MCG/ACT inhaler Inhale 1-2 puffs into the lungs every 6 (six) hours as needed for wheezing or shortness of breath. 18 g 1   amLODipine  (NORVASC ) 10 MG tablet TAKE ONE TABLET BY MOUTH DAILY FOR BLOOD PRESSURE 90 tablet 2   atorvastatin  (LIPITOR) 40 MG tablet TAKE 1 TABLET BY MOUTH DAILY FOR CHOLESTEROL 90 tablet 2   Cholecalciferol (VITAMIN D) 50 MCG (2000 UT) tablet Take 2,000 Units by mouth daily.     citalopram  (CELEXA ) 40 MG tablet TAKE 1 TABLET BY MOUTH DAILY FOR ANXIETY 90 tablet 2   fluticasone -salmeterol (ADVAIR) 250-50 MCG/ACT AEPB INHALE 1 PUFF BY MOUTH EVERY MORNING AND INHALE 1 PUFF BY MOUTH EVERY EVENING 180 each 2   losartan  (COZAAR ) 50 MG tablet TAKE 1 TABLET BY MOUTH DAILY FOR BLOOD PRESSURE 90 tablet 2   metFORMIN  (GLUCOPHAGE -XR) 500 MG 24 hr tablet Take 1 tablet (500 mg total) by mouth daily with breakfast. for diabetes. 90 tablet 1   Multiple Vitamins-Minerals (MULTIVITAMIN ADULT PO) Take by mouth.     sildenafil  (VIAGRA ) 100 MG tablet Take  1/2 to 1 tablet by mouth 30 min prior to sexual activity 30 tablet 0   No current facility-administered medications on file prior to visit.    BP 134/62   Pulse 73   Temp 97.7 F (36.5 C) (Temporal)   Ht 5' 5 (1.651 m)   Wt 147 lb (66.7 kg)   SpO2 92%   BMI 24.46 kg/m  Objective:   Physical Exam Cardiovascular:     Rate and Rhythm: Normal rate and regular rhythm.  Pulmonary:     Effort: Pulmonary effort is normal.     Breath sounds: Normal breath sounds.  Musculoskeletal:     Cervical  back: Neck supple.  Skin:    General: Skin is warm and dry.  Neurological:     Mental Status: He is alert and oriented to person, place, and time.  Psychiatric:        Mood and Affect: Mood normal.     Physical Exam        Assessment & Plan:  Type 2 diabetes mellitus with other circulatory complication, without long-term current use of insulin  (HCC) Assessment & Plan: Controlled with A1c of 6.2 today.  Continue metformin  ER 500 mg daily. Foot exam today. Urine microalbumin due and pending.  Follow-up in 6 months.  Orders: -     POCT glycosylated hemoglobin (Hb A1C) -     Microalbumin / creatinine urine ratio    Assessment and Plan Assessment & Plan       Comer MARLA Gaskins, NP     History of Present Illness

## 2024-07-13 ENCOUNTER — Other Ambulatory Visit: Payer: Self-pay

## 2024-07-13 ENCOUNTER — Emergency Department

## 2024-07-13 ENCOUNTER — Inpatient Hospital Stay
Admission: EM | Admit: 2024-07-13 | Discharge: 2024-07-16 | DRG: 871 | Disposition: A | Discharge status: Home / Self Care | Attending: Osteopathic Medicine | Admitting: Osteopathic Medicine

## 2024-07-13 DIAGNOSIS — Z8249 Family history of ischemic heart disease and other diseases of the circulatory system: Secondary | ICD-10-CM

## 2024-07-13 DIAGNOSIS — A419 Sepsis, unspecified organism: Secondary | ICD-10-CM | POA: Diagnosis not present

## 2024-07-13 DIAGNOSIS — F419 Anxiety disorder, unspecified: Secondary | ICD-10-CM | POA: Diagnosis not present

## 2024-07-13 DIAGNOSIS — E872 Acidosis, unspecified: Secondary | ICD-10-CM | POA: Diagnosis not present

## 2024-07-13 DIAGNOSIS — R911 Solitary pulmonary nodule: Secondary | ICD-10-CM | POA: Diagnosis not present

## 2024-07-13 DIAGNOSIS — F1421 Cocaine dependence, in remission: Secondary | ICD-10-CM | POA: Diagnosis not present

## 2024-07-13 DIAGNOSIS — F3181 Bipolar II disorder: Secondary | ICD-10-CM | POA: Diagnosis present

## 2024-07-13 DIAGNOSIS — R918 Other nonspecific abnormal finding of lung field: Secondary | ICD-10-CM | POA: Diagnosis not present

## 2024-07-13 DIAGNOSIS — I493 Ventricular premature depolarization: Secondary | ICD-10-CM | POA: Diagnosis present

## 2024-07-13 DIAGNOSIS — F902 Attention-deficit hyperactivity disorder, combined type: Secondary | ICD-10-CM | POA: Diagnosis present

## 2024-07-13 DIAGNOSIS — Z7951 Long term (current) use of inhaled steroids: Secondary | ICD-10-CM

## 2024-07-13 DIAGNOSIS — Z8719 Personal history of other diseases of the digestive system: Secondary | ICD-10-CM

## 2024-07-13 DIAGNOSIS — Z87891 Personal history of nicotine dependence: Secondary | ICD-10-CM

## 2024-07-13 DIAGNOSIS — I2721 Secondary pulmonary arterial hypertension: Secondary | ICD-10-CM | POA: Diagnosis present

## 2024-07-13 DIAGNOSIS — G458 Other transient cerebral ischemic attacks and related syndromes: Secondary | ICD-10-CM | POA: Diagnosis present

## 2024-07-13 DIAGNOSIS — R6520 Severe sepsis without septic shock: Secondary | ICD-10-CM | POA: Diagnosis not present

## 2024-07-13 DIAGNOSIS — J9601 Acute respiratory failure with hypoxia: Secondary | ICD-10-CM | POA: Diagnosis present

## 2024-07-13 DIAGNOSIS — Z83438 Family history of other disorder of lipoprotein metabolism and other lipidemia: Secondary | ICD-10-CM

## 2024-07-13 DIAGNOSIS — J9801 Acute bronchospasm: Secondary | ICD-10-CM | POA: Diagnosis not present

## 2024-07-13 DIAGNOSIS — J188 Other pneumonia, unspecified organism: Secondary | ICD-10-CM | POA: Diagnosis present

## 2024-07-13 DIAGNOSIS — J440 Chronic obstructive pulmonary disease with acute lower respiratory infection: Secondary | ICD-10-CM | POA: Diagnosis present

## 2024-07-13 DIAGNOSIS — E119 Type 2 diabetes mellitus without complications: Secondary | ICD-10-CM | POA: Diagnosis present

## 2024-07-13 DIAGNOSIS — A4189 Other specified sepsis: Secondary | ICD-10-CM | POA: Diagnosis not present

## 2024-07-13 DIAGNOSIS — I708 Atherosclerosis of other arteries: Secondary | ICD-10-CM | POA: Diagnosis not present

## 2024-07-13 DIAGNOSIS — E785 Hyperlipidemia, unspecified: Secondary | ICD-10-CM | POA: Diagnosis not present

## 2024-07-13 DIAGNOSIS — I700 Atherosclerosis of aorta: Secondary | ICD-10-CM | POA: Diagnosis present

## 2024-07-13 DIAGNOSIS — I10 Essential (primary) hypertension: Secondary | ICD-10-CM | POA: Diagnosis not present

## 2024-07-13 DIAGNOSIS — E1159 Type 2 diabetes mellitus with other circulatory complications: Secondary | ICD-10-CM

## 2024-07-13 DIAGNOSIS — U071 COVID-19: Principal | ICD-10-CM | POA: Diagnosis present

## 2024-07-13 DIAGNOSIS — Z66 Do not resuscitate: Secondary | ICD-10-CM | POA: Diagnosis present

## 2024-07-13 DIAGNOSIS — J441 Chronic obstructive pulmonary disease with (acute) exacerbation: Secondary | ICD-10-CM | POA: Diagnosis present

## 2024-07-13 DIAGNOSIS — R590 Localized enlarged lymph nodes: Secondary | ICD-10-CM | POA: Diagnosis present

## 2024-07-13 DIAGNOSIS — Z9889 Other specified postprocedural states: Secondary | ICD-10-CM

## 2024-07-13 DIAGNOSIS — J439 Emphysema, unspecified: Secondary | ICD-10-CM | POA: Diagnosis not present

## 2024-07-13 DIAGNOSIS — Z604 Social exclusion and rejection: Secondary | ICD-10-CM | POA: Diagnosis present

## 2024-07-13 DIAGNOSIS — J984 Other disorders of lung: Secondary | ICD-10-CM | POA: Diagnosis present

## 2024-07-13 DIAGNOSIS — Z825 Family history of asthma and other chronic lower respiratory diseases: Secondary | ICD-10-CM

## 2024-07-13 DIAGNOSIS — F1020 Alcohol dependence, uncomplicated: Secondary | ICD-10-CM | POA: Diagnosis present

## 2024-07-13 DIAGNOSIS — R058 Other specified cough: Secondary | ICD-10-CM | POA: Diagnosis not present

## 2024-07-13 DIAGNOSIS — J811 Chronic pulmonary edema: Secondary | ICD-10-CM | POA: Diagnosis not present

## 2024-07-13 DIAGNOSIS — R059 Cough, unspecified: Secondary | ICD-10-CM | POA: Diagnosis not present

## 2024-07-13 DIAGNOSIS — Z7984 Long term (current) use of oral hypoglycemic drugs: Secondary | ICD-10-CM

## 2024-07-13 DIAGNOSIS — J189 Pneumonia, unspecified organism: Secondary | ICD-10-CM | POA: Diagnosis not present

## 2024-07-13 DIAGNOSIS — Z79899 Other long term (current) drug therapy: Secondary | ICD-10-CM

## 2024-07-13 DIAGNOSIS — R0602 Shortness of breath: Secondary | ICD-10-CM | POA: Diagnosis not present

## 2024-07-13 DIAGNOSIS — Z860101 Personal history of adenomatous and serrated colon polyps: Secondary | ICD-10-CM

## 2024-07-13 LAB — COMPREHENSIVE METABOLIC PANEL WITH GFR
ALT: 30 U/L (ref 0–44)
AST: 39 U/L (ref 15–41)
Albumin: 3.5 g/dL (ref 3.5–5.0)
Alkaline Phosphatase: 76 U/L (ref 38–126)
Anion gap: 12 (ref 5–15)
BUN: 12 mg/dL (ref 8–23)
CO2: 31 mmol/L (ref 22–32)
Calcium: 8.6 mg/dL — ABNORMAL LOW (ref 8.9–10.3)
Chloride: 92 mmol/L — ABNORMAL LOW (ref 98–111)
Creatinine, Ser: 0.91 mg/dL (ref 0.61–1.24)
GFR, Estimated: >60 mL/min (ref >60)
Glucose, Bld: 124 mg/dL — ABNORMAL HIGH (ref 70–99)
Potassium: 4 mmol/L (ref 3.5–5.1)
Sodium: 135 mmol/L (ref 135–145)
Total Bilirubin: 0.7 mg/dL (ref 0.0–1.2)
Total Protein: 7.5 g/dL (ref 6.5–8.1)

## 2024-07-13 LAB — CBC WITH DIFFERENTIAL/PLATELET
Abs Immature Granulocytes: 0.06 K/uL (ref 0.00–0.07)
Basophils Absolute: 0 K/uL (ref 0.0–0.1)
Basophils Relative: 0 %
Eosinophils Absolute: 0 K/uL (ref 0.0–0.5)
Eosinophils Relative: 0 %
HCT: 43.9 % (ref 39.0–52.0)
Hemoglobin: 14.1 g/dL (ref 13.0–17.0)
Immature Granulocytes: 1 %
Lymphocytes Relative: 9 %
Lymphs Abs: 1.1 K/uL (ref 0.7–4.0)
MCH: 30.4 pg (ref 26.0–34.0)
MCHC: 32.1 g/dL (ref 30.0–36.0)
MCV: 94.6 fL (ref 80.0–100.0)
Monocytes Absolute: 1.2 K/uL — ABNORMAL HIGH (ref 0.1–1.0)
Monocytes Relative: 10 %
Neutro Abs: 10.5 K/uL — ABNORMAL HIGH (ref 1.7–7.7)
Neutrophils Relative %: 80 %
Platelets: 215 K/uL (ref 150–400)
RBC: 4.64 MIL/uL (ref 4.22–5.81)
RDW: 13.2 % (ref 11.5–15.5)
Smear Review: NORMAL
WBC: 13 K/uL — ABNORMAL HIGH (ref 4.0–10.5)
nRBC: 0 % (ref 0.0–0.2)

## 2024-07-13 LAB — BLOOD GAS, VENOUS
Acid-Base Excess: 8.2 mmol/L — ABNORMAL HIGH (ref 0.0–2.0)
Bicarbonate: 35.5 mmol/L — ABNORMAL HIGH (ref 20.0–28.0)
O2 Saturation: 91.7 %
Patient temperature: 37
pCO2, Ven: 60 mmHg (ref 44–60)
pH, Ven: 7.38 (ref 7.25–7.43)
pO2, Ven: 62 mmHg — ABNORMAL HIGH (ref 32–45)

## 2024-07-13 LAB — RESP PANEL BY RT-PCR (RSV, FLU A&B, COVID)  RVPGX2
Influenza A by PCR: NEGATIVE
Influenza B by PCR: NEGATIVE
Resp Syncytial Virus by PCR: NEGATIVE
SARS Coronavirus 2 by RT PCR: POSITIVE — AB

## 2024-07-13 LAB — PROTIME-INR
INR: 1.1 (ref 0.8–1.2)
Prothrombin Time: 14.4 s (ref 11.4–15.2)

## 2024-07-13 LAB — LACTIC ACID, PLASMA
Lactic Acid, Venous: 1.4 mmol/L (ref 0.5–1.9)
Lactic Acid, Venous: 1.6 mmol/L (ref 0.5–1.9)

## 2024-07-13 LAB — BRAIN NATRIURETIC PEPTIDE: B Natriuretic Peptide: 93.6 pg/mL (ref 0.0–100.0)

## 2024-07-13 MED ORDER — VITAMIN D3 25 MCG (1000 UNIT) PO TABS
2000.0000 [IU] | ORAL_TABLET | Freq: Every day | ORAL | Status: DC
  Administered 2024-07-14 – 2024-07-16 (×3): 2000 [IU] via ORAL
  Filled 2024-07-13 (×6): qty 2

## 2024-07-13 MED ORDER — IPRATROPIUM-ALBUTEROL 0.5-2.5 (3) MG/3ML IN SOLN
3.0000 mL | Freq: Once | RESPIRATORY_TRACT | Status: AC
  Administered 2024-07-13: 3 mL via RESPIRATORY_TRACT
  Filled 2024-07-13: qty 3

## 2024-07-13 MED ORDER — LACTATED RINGERS IV SOLN: 150.0000 mL/h | INTRAVENOUS | Status: DC

## 2024-07-13 MED ORDER — GUAIFENESIN ER 600 MG PO TB12
600.0000 mg | ORAL_TABLET | Freq: Two times a day (BID) | ORAL | Status: DC
  Administered 2024-07-13 – 2024-07-16 (×6): 600 mg via ORAL
  Filled 2024-07-13 (×6): qty 1

## 2024-07-13 MED ORDER — LOSARTAN POTASSIUM 50 MG PO TABS
50.0000 mg | ORAL_TABLET | Freq: Every day | ORAL | Status: DC
  Administered 2024-07-14 – 2024-07-16 (×3): 50 mg via ORAL
  Filled 2024-07-13 (×3): qty 1

## 2024-07-13 MED ORDER — ACETAMINOPHEN 650 MG RE SUPP: 650.0000 mg | Freq: Four times a day (QID) | RECTAL | Status: DC | PRN

## 2024-07-13 MED ORDER — AMLODIPINE BESYLATE 5 MG PO TABS
10.0000 mg | ORAL_TABLET | Freq: Every day | ORAL | Status: DC
Start: 2024-07-14 — End: 2024-07-14

## 2024-07-13 MED ORDER — ENOXAPARIN SODIUM 40 MG/0.4ML IJ SOSY
40.0000 mg | PREFILLED_SYRINGE | INTRAMUSCULAR | Status: DC
  Administered 2024-07-14 – 2024-07-16 (×3): 40 mg via SUBCUTANEOUS
  Filled 2024-07-13 (×3): qty 0.4

## 2024-07-13 MED ORDER — ONDANSETRON HCL 4 MG/2ML IJ SOLN: 4.0000 mg | Freq: Four times a day (QID) | INTRAMUSCULAR | Status: DC | PRN

## 2024-07-13 MED ORDER — IPRATROPIUM-ALBUTEROL 0.5-2.5 (3) MG/3ML IN SOLN
3.0000 mL | Freq: Once | RESPIRATORY_TRACT | Status: AC
  Administered 2024-07-13: 3 mL via RESPIRATORY_TRACT

## 2024-07-13 MED ORDER — SODIUM CHLORIDE 0.9 % IV SOLN
500.0000 mg | INTRAVENOUS | Status: DC
  Administered 2024-07-14: 500 mg via INTRAVENOUS
  Filled 2024-07-13: qty 5

## 2024-07-13 MED ORDER — METHYLPREDNISOLONE SODIUM SUCC 125 MG IJ SOLR
80.0000 mg | Freq: Every day | INTRAMUSCULAR | Status: DC
  Administered 2024-07-13 – 2024-07-16 (×4): 80 mg via INTRAVENOUS
  Filled 2024-07-13 (×4): qty 2

## 2024-07-13 MED ORDER — ATORVASTATIN CALCIUM 20 MG PO TABS
40.0000 mg | ORAL_TABLET | Freq: Every day | ORAL | Status: DC
  Administered 2024-07-14 – 2024-07-16 (×3): 40 mg via ORAL
  Filled 2024-07-13 (×3): qty 2

## 2024-07-13 MED ORDER — ACETAMINOPHEN 325 MG PO TABS: 650.0000 mg | ORAL_TABLET | Freq: Once | ORAL | Status: DC

## 2024-07-13 MED ORDER — IPRATROPIUM-ALBUTEROL 0.5-2.5 (3) MG/3ML IN SOLN
3.0000 mL | Freq: Once | RESPIRATORY_TRACT | Status: DC
  Filled 2024-07-13: qty 3

## 2024-07-13 MED ORDER — SODIUM CHLORIDE 0.9 % IV SOLN
2.0000 g | INTRAVENOUS | Status: DC
  Administered 2024-07-14 – 2024-07-15 (×2): 2 g via INTRAVENOUS
  Filled 2024-07-13 (×3): qty 20

## 2024-07-13 MED ORDER — LACTATED RINGERS IV BOLUS (SEPSIS)
1000.0000 mL | Freq: Once | INTRAVENOUS | Status: AC
  Administered 2024-07-13: 1000 mL via INTRAVENOUS

## 2024-07-13 MED ORDER — KETOROLAC TROMETHAMINE 15 MG/ML IJ SOLN
15.0000 mg | Freq: Once | INTRAMUSCULAR | Status: AC
  Administered 2024-07-13: 15 mg via INTRAVENOUS
  Filled 2024-07-13: qty 1

## 2024-07-13 MED ORDER — ACETAMINOPHEN 325 MG PO TABS
650.0000 mg | ORAL_TABLET | Freq: Four times a day (QID) | ORAL | Status: DC | PRN
  Filled 2024-07-13 (×2): qty 2

## 2024-07-13 MED ORDER — IPRATROPIUM-ALBUTEROL 0.5-2.5 (3) MG/3ML IN SOLN
3.0000 mL | Freq: Four times a day (QID) | RESPIRATORY_TRACT | Status: DC
Start: 2024-07-14 — End: 2024-07-14
  Administered 2024-07-14 (×2): 3 mL via RESPIRATORY_TRACT
  Filled 2024-07-13 (×2): qty 3

## 2024-07-13 MED ORDER — IOHEXOL 350 MG/ML SOLN
75.0000 mL | Freq: Once | INTRAVENOUS | Status: AC | PRN
  Administered 2024-07-13: 75 mL via INTRAVENOUS

## 2024-07-13 MED ORDER — LACTATED RINGERS IV SOLN: INTRAVENOUS | Status: DC

## 2024-07-13 MED ORDER — TRAZODONE HCL 50 MG PO TABS: 25.0000 mg | ORAL_TABLET | Freq: Every evening | ORAL | Status: DC | PRN

## 2024-07-13 MED ORDER — CITALOPRAM HYDROBROMIDE 20 MG PO TABS
40.0000 mg | ORAL_TABLET | Freq: Every day | ORAL | Status: DC
  Administered 2024-07-14 – 2024-07-16 (×3): 40 mg via ORAL
  Filled 2024-07-13 (×3): qty 2

## 2024-07-13 MED ORDER — SODIUM CHLORIDE 0.9 % IV SOLN
2.0000 g | Freq: Once | INTRAVENOUS | Status: AC
  Administered 2024-07-13: 2 g via INTRAVENOUS
  Filled 2024-07-13: qty 20

## 2024-07-13 MED ORDER — SODIUM CHLORIDE 0.9 % IV SOLN
500.0000 mg | Freq: Once | INTRAVENOUS | Status: AC
  Administered 2024-07-13: 500 mg via INTRAVENOUS
  Filled 2024-07-13: qty 5

## 2024-07-13 MED ORDER — IPRATROPIUM-ALBUTEROL 0.5-2.5 (3) MG/3ML IN SOLN
3.0000 mL | RESPIRATORY_TRACT | Status: DC | PRN
  Administered 2024-07-14: 3 mL via RESPIRATORY_TRACT
  Filled 2024-07-13: qty 3

## 2024-07-13 MED ORDER — HYDROCOD POLI-CHLORPHE POLI ER 10-8 MG/5ML PO SUER
5.0000 mL | Freq: Two times a day (BID) | ORAL | Status: DC | PRN
  Administered 2024-07-15: 5 mL via ORAL
  Filled 2024-07-13: qty 5

## 2024-07-13 MED ORDER — ONDANSETRON HCL 4 MG PO TABS: 4.0000 mg | ORAL_TABLET | Freq: Four times a day (QID) | ORAL | Status: DC | PRN

## 2024-07-13 MED ORDER — MAGNESIUM HYDROXIDE 400 MG/5ML PO SUSP: 30.0000 mL | Freq: Every day | ORAL | Status: DC | PRN

## 2024-07-13 NOTE — ED Provider Notes (Signed)
 [facility] Provider Note    Event Date/Time   First MD Initiated Contact with Patient 07/13/24 1826     (approximate)   History   Shortness of Breath   HPI  Roy Pomplun. is a 63 y.o. male  with history of hypertension, prior cocaine use, prior tobacco use, COPD, hyperlipidemia, hypertension, bipolar type II, ADHD, who presents to the emergency department for chief concerns of shortness of breath.  Patient states that he developed cough and shortness of breath approximately 1 week ago.  He had measured fevers at home and he has been taking ibuprofen as directed by the bottle.  He developed acute worsening of shortness of breath approximately 2 hours prior to arrival.  He states that his home pulse ox was 60% however upon fire arrival, his oxygen was in the low 80s.  He was placed on a nonrebreather and transported here.  No medications were given en route.  Patient denies any chest pain abdominal pain changes in urinary or bowel habits.  Denies any substance use and states that he no longer smokes.  He does not use any oxygen at baseline      Physical Exam   Triage Vital Signs: ED Triage Vitals  Encounter Vitals Group     BP 07/13/24 1827 134/60     Girls Systolic BP Percentile --      Girls Diastolic BP Percentile --      Boys Systolic BP Percentile --      Boys Diastolic BP Percentile --      Pulse Rate 07/13/24 1827 92     Resp 07/13/24 1827 (!) 22     Temp 07/13/24 1827 (!) 100.9 F (38.3 C)     Temp Source 07/13/24 1827 Oral     SpO2 07/13/24 1824 95 %     Weight 07/13/24 1825 140 lb (63.5 kg)     Height 07/13/24 1825 5' 4 (1.626 m)     Head Circumference --      Peak Flow --      Pain Score 07/13/24 1825 0     Pain Loc --      Pain Education --      Exclude from Growth Chart --     Most recent vital signs: Vitals:   07/13/24 1827 07/13/24 2310  BP: 134/60   Pulse: 92   Resp: (!) 22   Temp: (!) 100.9 F (38.3 C) 98.3 F (36.8 C)   SpO2: 92%     Nursing Triage Note reviewed. Vital signs reviewed and patients oxygen saturation is normoxic, but with an increased respiratory rate of roughly 28-32 on arrival.  Oxygen was placed for patient comfort  General: Patient is well nourished, well developed, awake and alert, resting comfortably in no acute distress Head: Normocephalic and atraumatic Eyes: Normal inspection, extraocular muscles intact, no conjunctival pallor Ear, nose, throat: Normal external exam Neck: Normal range of motion Respiratory: Patient is in moderate respiratory distress, lungs rhonchi bilaterally Cardiovascular: Patient is tachycardic, RR  GI: Abd SNT with no guarding or rebound  Back: Normal inspection of the back with good strength and range of motion throughout all ext Extremities: pulses intact with good cap refills, no LE pitting edema or calf tenderness Neuro: The patient is alert and oriented to person, place, and time, appropriately conversive, with 5/5 bilat UE/LE strength, no gross motor or sensory defects noted. Coordination appears to be adequate. Skin: Warm, dry, and intact Psych: normal mood  and affect, no SI or HI  ED Results / Procedures / Treatments   Labs (all labs ordered are listed, but only abnormal results are displayed) Labs Reviewed  RESP PANEL BY RT-PCR (RSV, FLU A&B, COVID)  RVPGX2 - Abnormal; Notable for the following components:      Result Value   SARS Coronavirus 2 by RT PCR POSITIVE (*)    All other components within normal limits  COMPREHENSIVE METABOLIC PANEL WITH GFR - Abnormal; Notable for the following components:   Chloride 92 (*)    Glucose, Bld 124 (*)    Calcium  8.6 (*)    All other components within normal limits  CBC WITH DIFFERENTIAL/PLATELET - Abnormal; Notable for the following components:   WBC 13.0 (*)    Neutro Abs 10.5 (*)    Monocytes Absolute 1.2 (*)    All other components within normal limits  BLOOD GAS, VENOUS - Abnormal; Notable for  the following components:   pO2, Ven 62 (*)    Bicarbonate 35.5 (*)    Acid-Base Excess 8.2 (*)    All other components within normal limits  CULTURE, BLOOD (ROUTINE X 2)  CULTURE, BLOOD (ROUTINE X 2)  LACTIC ACID, PLASMA  PROTIME-INR  BRAIN NATRIURETIC PEPTIDE  LACTIC ACID, PLASMA  URINALYSIS, W/ REFLEX TO CULTURE (INFECTION SUSPECTED)  HIV ANTIBODY (ROUTINE TESTING W REFLEX)  PROTIME-INR  CORTISOL-AM, BLOOD  BASIC METABOLIC PANEL WITH GFR  CBC     EKG EKG and rhythm strip are interpreted by myself:   EKG: [Normal sinus rhythm] at heart rate of 89, normal QRS duration, QTc 426, normal ST segments and T waves no ectopy EKG not consistent with Acute STEMI Rhythm strip: NSR in lead II   RADIOLOGY Xray chest: No acute abnormality on my independent review interpretation radiologist agrees    PROCEDURES:  Critical Care performed: Yes, see critical care procedure note(s)  .Critical Care  Performed by: Nicholaus Rolland BRAVO, MD Authorized by: Nicholaus Rolland BRAVO, MD   Critical care provider statement:    Critical care time (minutes):  35   Critical care was necessary to treat or prevent imminent or life-threatening deterioration of the following conditions:  Respiratory failure and sepsis   Critical care was time spent personally by me on the following activities:  Development of treatment plan with patient or surrogate, discussions with consultants, evaluation of patient's response to treatment, examination of patient, ordering and review of laboratory studies, ordering and review of radiographic studies, ordering and performing treatments and interventions, pulse oximetry, re-evaluation of patient's condition and review of old charts Comments:     Patient meets sepsis criteria and was sepsis bundled with aggressive fluid hydration with 30 cc/kg.  Patient also given 3 DuoNebs in succession for bronchospasm    MEDICATIONS ORDERED IN ED: Medications  lactated ringers infusion (  Intravenous New Bag/Given 07/13/24 2117)  ipratropium-albuterol  (DUONEB) 0.5-2.5 (3) MG/3ML nebulizer solution 3 mL (3 mLs Nebulization Not Given 07/13/24 2004)  lactated ringers infusion (150 mL/hr Intravenous Not Given 07/13/24 2255)  enoxaparin  (LOVENOX ) injection 40 mg (has no administration in time range)  cefTRIAXone  (ROCEPHIN ) 2 g in sodium chloride  0.9 % 100 mL IVPB (has no administration in time range)  azithromycin  (ZITHROMAX ) 500 mg in sodium chloride  0.9 % 250 mL IVPB (has no administration in time range)  acetaminophen  (TYLENOL ) tablet 650 mg (has no administration in time range)    Or  acetaminophen  (TYLENOL ) suppository 650 mg (has no administration in time range)  traZODone (DESYREL)  tablet 25 mg (has no administration in time range)  magnesium hydroxide (MILK OF MAGNESIA) suspension 30 mL (has no administration in time range)  ondansetron  (ZOFRAN ) tablet 4 mg (has no administration in time range)    Or  ondansetron  (ZOFRAN ) injection 4 mg (has no administration in time range)  guaiFENesin (MUCINEX) 12 hr tablet 600 mg (600 mg Oral Given 07/13/24 2305)  chlorpheniramine-HYDROcodone (TUSSIONEX) 10-8 MG/5ML suspension 5 mL (has no administration in time range)  ipratropium-albuterol  (DUONEB) 0.5-2.5 (3) MG/3ML nebulizer solution 3 mL (has no administration in time range)  methylPREDNISolone  sodium succinate (SOLU-MEDROL ) 125 mg/2 mL injection 80 mg (80 mg Intravenous Given 07/13/24 2305)  ipratropium-albuterol  (DUONEB) 0.5-2.5 (3) MG/3ML nebulizer solution 3 mL (has no administration in time range)  amLODipine  (NORVASC ) tablet 10 mg (has no administration in time range)  atorvastatin  (LIPITOR) tablet 40 mg (has no administration in time range)  losartan  (COZAAR ) tablet 50 mg (has no administration in time range)  citalopram  (CELEXA ) tablet 40 mg (has no administration in time range)  cholecalciferol (VITAMIN D3) tablet 2,000 Units (has no administration in time range)  lactated  ringers bolus 1,000 mL (0 mLs Intravenous Stopped 07/13/24 2108)    And  lactated ringers bolus 1,000 mL (0 mLs Intravenous Stopped 07/13/24 2108)  cefTRIAXone  (ROCEPHIN ) 2 g in sodium chloride  0.9 % 100 mL IVPB (0 g Intravenous Stopped 07/13/24 1956)  azithromycin  (ZITHROMAX ) 500 mg in sodium chloride  0.9 % 250 mL IVPB (0 mg Intravenous Stopped 07/13/24 2203)  ipratropium-albuterol  (DUONEB) 0.5-2.5 (3) MG/3ML nebulizer solution 3 mL (3 mLs Nebulization Given 07/13/24 1909)  ipratropium-albuterol  (DUONEB) 0.5-2.5 (3) MG/3ML nebulizer solution 3 mL (3 mLs Nebulization Given 07/13/24 1919)  ketorolac (TORADOL) 15 MG/ML injection 15 mg (15 mg Intravenous Given 07/13/24 1908)  iohexol  (OMNIPAQUE ) 350 MG/ML injection 75 mL (75 mLs Intravenous Contrast Given 07/13/24 1943)     IMPRESSION / MDM / ASSESSMENT AND PLAN / ED COURSE                                Differential diagnosis includes, but is not limited to, sepsis, pneumonia, COPD, chronic respiratory failure, electrolyte derangement URI, anemia, electrolyte derangement   ED course: Patient presents acutely and he is febrile and tachycardic with an increased respiratory rate.  Although not hypoxic on arrival, oxygen was placed with improvement in respiratory rate.  Patient was sepsis bundled with aggressive fluid hydration and antibiotics geared towards respiratory causes.  Surprisingly his chest x-ray was rather unremarkable.  He did have a leukocytosis but no elevated lactic acid.  He did test positive for COVID.  Given his symptoms, I did send him for a CT angio PE which demonstrated no pulmonary embolism but did demonstrate a cavitary pulmonary lesion.  He was given 3 DuoNebs in succession with improvement of his respiratory status.  I did consider administering Solu-Medrol  for COPD exacerbation however given these cavitary lesions we will hold off and defer to the hospitalist.  Case discussed for admission   Clinical Course as of 07/13/24  2318  Fri Jul 13, 2024  1858 DG Chest [place] 1 View No acute abnormality [HD]  1915 WBC(!): 13.0 leukocytosis [HD]  1923 Lactic Acid, Venous: 1.4 Not elevated [HD]  2003 SARS Coronavirus 2 by RT PCR(!): POSITIVE COVID-positive [HD]  2031 No evidence of acute pulmonary embolus. 2. Small nodular airspace opacities scattered throughout the lungs bilaterally, demonstrating areas of central cavitation. Findings are consistent  with atypical cavitating pneumonia versus sequela of septic emboli. 3. Subcentimeter mediastinal and hilar lymph nodes consistent with reactive adenopathy. No pathologically enlarged lymph nodes are identified. 4. 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 5. Dilated main pulmonary artery consistent with pulmonary arterial hypertension.   [HD]  2050 Case discussed with hospitalist for admission [HD]    Clinical Course User Index [HD] Nicholaus Rolland BRAVO, MD   -- Risk: 5 This patient has a high risk of morbidity due to further diagnostic testing or treatment. Rationale: This patient's evaluation and management involve a high risk of morbidity due to the potential severity of presenting symptoms, need for diagnostic testing, and/or initiation of treatment that may require close monitoring. The differential includes conditions with potential for significant deterioration or requiring escalation of care. Treatment decisions in the ED, including medication administration, procedural interventions, or disposition planning, reflect this level of risk. COPA: 5 The patient has the following acute or chronic illness/injury that poses a possible threat to life or bodily function: [X] : The patient has a potentially serious acute condition or an acute exacerbation of a chronic illness requiring urgent evaluation and management in the Emergency  Department. The clinical presentation necessitates immediate consideration of life-threatening or function-threatening diagnoses, even if they are ultimately ruled out.   FINAL CLINICAL IMPRESSION(S) / ED DIAGNOSES   Final diagnoses:  COVID  Pneumonia due to infectious organism, unspecified laterality, unspecified part of lung  Bronchospasm  Cavitary lesion of lung     Rx / DC Orders   ED Discharge Orders     None        Note:  This document was prepared using Dragon voice recognition software and may include unintentional dictation errors.   Nicholaus Rolland BRAVO, MD 07/13/24 [phone]

## 2024-07-13 NOTE — Progress Notes (Signed)
 CODE SEPSIS - PHARMACY COMMUNICATION  **Broad Spectrum Antibiotics should be administered within 1 hour of Sepsis diagnosis**  Time Code Sepsis Called/Page Received: 1828  Antibiotics Ordered: azithromycin , ceftriaxone   Time of 1st antibiotic administration: 1909  Additional action taken by pharmacy: -  If necessary, Name of Provider/Nurse Contacted: -    Lum VEAR Mania ,PharmD Clinical Pharmacist  07/13/2024  6:28 PM

## 2024-07-13 NOTE — Sepsis Progress Note (Signed)
Code Sepsis Protocol being monitored by eLink. 

## 2024-07-13 NOTE — H&P (Signed)
 [place]   PATIENT NAME: Roy Richards    MR#:  [id number]  DATE OF BIRTH:  26-Aug-1961  DATE OF ADMISSION:  07/13/2024  PRIMARY CARE PHYSICIAN: [name], NP   Patient is coming from: Home  REQUESTING/REFERRING PHYSICIAN: Nicholaus Maize, MD  CHIEF COMPLAINT:   Chief Complaint  Patient presents with   Shortness of Breath    HISTORY OF PRESENT ILLNESS:  Roy Klopf. is a 63 y.o. male with medical history significant for anxiety, depression, type 2 diabetes mellitus, diverticulosis, emphysema, alcohol abuse, asthma, bipolar 2 disorder, hypertension tension and dyslipidemia, who presented to the emergency room with acute onset of worsening dyspnea with associated cough and wheezing over the last couple weeks.  He has been having tactile fever and chills.  No chest pain or palpitations.  He does not recognize any recent exposure to COVID-19.  He denies any loss of taste or smell.  He has been having diarrhea over the last couple days without nausea or vomiting.  No melena or bright red bleeding per rectum.  No dysuria, oliguria or hematuria or flank pain.  ED Course: When he came to the ER, temperature was 100.9/38.3, respiratory rate 22, pulse currently 92% on 2 L of O2 via nasal cannula.  He was down to 87% later on and up to 99% on 5 L of O2 by nasal cannula.  Labs revealed VBG with pH 7.38 and HCO3 35.5.  Chloride was 92 and calcium  8.6 with otherwise unremarkable CMP.  Lactic acid was 1.4 and later 1.6.  CBC showed leukocytosis 13 with neutrophilia.  Respiratory panel came back positive for COVID-19.  UA showed more than 1046 specific gravity and 30 protein.  Blood cultures were drawn. EKG as reviewed by me : EKG showed normal sinus rhythm with a rate of 89 with PVCs, right axis deviation and Q waves anteroseptally. Imaging: Portable chest x-ray showed stable scarring in the right lung base with no acute cardiopulmonary disease. Chest CTA showed the following: 1. No  evidence of acute pulmonary embolus. 2. Small nodular airspace opacities scattered throughout the lungs bilaterally, demonstrating areas of central cavitation. Findings are consistent with atypical cavitating pneumonia versus sequela of septic emboli. 3. Subcentimeter mediastinal and hilar lymph nodes consistent with reactive adenopathy. No pathologically enlarged lymph nodes are identified. 4. 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 5. Dilated main pulmonary artery consistent with pulmonary arterial hypertension. 6.  Aortic Atherosclerosis.  The patient was given 50 mg IV Toradol, DuoNeb twice and 2 g with IV Rocephin , 500 mg of IV Zithromax  as well as 2 L bolus of IV lactated ringer.  She will be admitted to a progressive unit bed for further evaluation and management. PAST MEDICAL HISTORY:   Past Medical History:  Diagnosis Date   Acute colitis 06/26/2019   Anxiety 2021   Building collapse   Asthma    Attention deficit hyperactivity disorder, combined type 09/12/2019   Bipolar II disorder (HCC) 09/12/2019   Cocaine dependence in remission (HCC) 09/12/2019   Contusion of abdominal wall 06/13/2023   Depression    Diabetes mellitus without complication (HCC)    Diverticular disease of colon 06/17/2023   Emphysema of lung (HCC) 2015   History of colon polyps 06/17/2023   Hyperlipidemia    Hypertension    Hypoxia 05/07/2020   Uncomplicated alcohol dependence (  HCC) 09/12/2019    PAST SURGICAL HISTORY:   Past Surgical History:  Procedure Laterality Date   BIOPSY  08/09/2023   Procedure: BIOPSY;  Surgeon: Therisa Bi, MD;  Location: [facility] ENDOSCOPY;  Service: Gastroenterology;;   COLONOSCOPY WITH PROPOFOL  N/A 08/09/2023   Procedure: COLONOSCOPY WITH PROPOFOL ;  Surgeon: Therisa Bi, MD;  Location: [facility] ENDOSCOPY;  Service:  Gastroenterology;  Laterality: N/A;   HERNIA REPAIR     POLYPECTOMY  08/09/2023   Procedure: POLYPECTOMY;  Surgeon: Therisa Bi, MD;  Location: [facility] ENDOSCOPY;  Service: Gastroenterology;;    SOCIAL HISTORY:   Social History   Tobacco Use   Smoking status: Former    Current packs/day: 0.50    Average packs/day: 0.5 packs/day for 26.1 years (13.0 ttl pk-yrs)    Types: Cigarettes    Start date: 12/15/2022   Smokeless tobacco: Never  Substance Use Topics   Alcohol use: Yes    Comment: occasionally    FAMILY HISTORY:   Family History  Problem Relation Age of Onset   COPD Mother    Heart attack Father 51   Heart disease Father    Hyperlipidemia Father    Hypertension Father     DRUG ALLERGIES:  No Known Allergies  REVIEW OF SYSTEMS:   ROS As per history of present illness. All pertinent systems were reviewed above. Constitutional, HEENT, cardiovascular, respiratory, GI, GU, musculoskeletal, neuro, psychiatric, endocrine, integumentary and hematologic systems were reviewed and are otherwise negative/unremarkable except for positive findings mentioned above in the HPI.   MEDICATIONS AT HOME:   Prior to Admission medications   Medication Sig Start Date End Date Taking? Authorizing Provider  albuterol  (PROVENTIL ) (2.5 MG/3ML) 0.083% nebulizer solution Take 3 mLs (2.5 mg total) by nebulization every 6 (six) hours as needed for wheezing or shortness of breath. 05/17/24   Clark, Katherine K, NP  albuterol  (VENTOLIN  HFA) 108 (90 Base) MCG/ACT inhaler Inhale 1-2 puffs into the lungs every 6 (six) hours as needed for wheezing or shortness of breath. 05/15/24   Wouk, Devaughn Sayres, MD  amLODipine  (NORVASC ) 10 MG tablet TAKE ONE TABLET BY MOUTH DAILY FOR BLOOD PRESSURE 01/02/24   Clark, Katherine K, NP  atorvastatin  (LIPITOR) 40 MG tablet TAKE 1 TABLET BY MOUTH DAILY FOR CHOLESTEROL 01/30/24   Clark, Katherine K, NP  Cholecalciferol (VITAMIN D) 50 MCG (2000 UT) tablet Take 2,000 Units by  mouth daily.    [provider]  citalopram  (CELEXA ) 40 MG tablet TAKE 1 TABLET BY MOUTH DAILY FOR ANXIETY 02/19/24   Clark, Katherine K, NP  fluticasone -salmeterol (ADVAIR) 250-50 MCG/ACT AEPB INHALE 1 PUFF BY MOUTH EVERY MORNING AND INHALE 1 PUFF BY MOUTH EVERY EVENING 02/24/24   Clark, Katherine K, NP  losartan  (COZAAR ) 50 MG tablet TAKE 1 TABLET BY MOUTH DAILY FOR BLOOD PRESSURE 02/17/24   Clark, Katherine K, NP  metFORMIN  (GLUCOPHAGE -XR) 500 MG 24 hr tablet Take 1 tablet (500 mg total) by mouth daily with breakfast. for diabetes. 02/17/24   Clark, Katherine K, NP  Multiple Vitamins-Minerals (MULTIVITAMIN ADULT PO) Take by mouth.    [provider]  sildenafil  (VIAGRA ) 100 MG tablet Take 1/2 to 1 tablet by mouth 30 min prior to sexual activity 06/06/24   [name], NP      VITAL SIGNS:  Blood pressure (!) 132/55, pulse 84, temperature 99.5 F (37.5 C), temperature source Oral, resp. rate (!) 27, height 5' 4 (1.626 m), weight 63.5 kg, SpO2 99%.  PHYSICAL EXAMINATION:  Physical Exam  GENERAL:  63 y.o.-year-old patient lying in the bed with mild respiratory distress with conversational dyspnea EYES: Pupils equal, round, reactive to light and accommodation. No scleral icterus. Extraocular muscles intact.  HEENT: Head atraumatic, normocephalic. Oropharynx and nasopharynx clear.  NECK:  Supple, no jugular venous distention. No thyroid enlargement, no tenderness.  LUNGS: Diminished bibasilar breath sounds with bibasal crackles.  With no use of accessory muscles of respiration.  CARDIOVASCULAR: Regular rate and rhythm, S1, S2 normal. No murmurs, rubs, or gallops.  ABDOMEN: Soft, nondistended, nontender. Bowel sounds present. No organomegaly or mass.  EXTREMITIES: No pedal edema, cyanosis, or clubbing.  NEUROLOGIC: Cranial nerves II through XII are intact. Muscle strength 5/5 in all extremities. Sensation intact. Gait not checked.  PSYCHIATRIC: The patient is alert and  oriented x 3.  Normal affect and good eye contact. SKIN: No obvious rash, lesion, or ulcer.   LABORATORY PANEL:   CBC Recent Labs  Lab 07/13/24 1843  WBC 13.0*  HGB 14.1  HCT 43.9  PLT 215   ------------------------------------------------------------------------------------------------------------------  Chemistries  Recent Labs  Lab 07/13/24 1843  NA 135  K 4.0  CL 92*  CO2 31  GLUCOSE 124*  BUN 12  CREATININE 0.91  CALCIUM  8.6*  AST 39  ALT 30  ALKPHOS 76  BILITOT 0.7   ------------------------------------------------------------------------------------------------------------------  Cardiac Enzymes No results for input(s): TROPONINI in the last 168 hours. ------------------------------------------------------------------------------------------------------------------  RADIOLOGY:  CT Angio Chest PE W and/or Wo Contrast Result Date: 07/13/2024 CLINICAL DATA:  Febrile, hypoxia, productive cough EXAM: CT ANGIOGRAPHY CHEST WITH CONTRAST TECHNIQUE: Multidetector CT imaging of the chest was performed using the standard protocol during bolus administration of intravenous contrast. Multiplanar CT image reconstructions and MIPs were obtained to evaluate the vascular anatomy. RADIATION DOSE REDUCTION: This exam was performed according to the departmental dose-optimization program which includes automated exposure control, adjustment of the mA and/or kV according to patient size and/or use of iterative reconstruction technique. CONTRAST:  75mL OMNIPAQUE  IOHEXOL  350 MG/ML SOLN COMPARISON:  07/13/2024, 05/23/2023 FINDINGS: Cardiovascular: This is a technically adequate evaluation of the pulmonary vasculature. No filling defects or pulmonary emboli. Dilated main pulmonary artery measuring 3.6 cm consistent with pulmonary arterial hypertension. The heart is unremarkable without pericardial effusion. No evidence of thoracic aortic aneurysm or dissection. There is atherosclerosis of  the thoracic aorta. 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 Mediastinum/Nodes: Subcentimeter mediastinal and hilar lymph nodes are identified, measuring up to 9 mm in the precarinal region. Thyroid, trachea, and esophagus are unremarkable. Lungs/Pleura: Nodular airspace disease is seen diffusely throughout the bilateral lungs, with many of the small nodular areas of consolidation demonstrating central cavitation. Findings are consistent with atypical cavitating pneumonia, versus sequela of septic emboli. No effusion or pneumothorax. Central airways are patent. Upper Abdomen: No acute abnormality. Musculoskeletal: No acute or destructive bony abnormalities. Reconstructed images demonstrate no additional findings. Review of the MIP images confirms the above findings. IMPRESSION: 1. No evidence of acute pulmonary embolus. 2. Small nodular airspace opacities scattered throughout the lungs bilaterally, demonstrating areas of central cavitation. Findings are consistent with atypical cavitating pneumonia versus sequela of septic emboli. 3. Subcentimeter mediastinal and hilar lymph nodes consistent with reactive adenopathy. No pathologically enlarged lymph nodes are identified. 4. 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 5. Dilated main pulmonary artery consistent with pulmonary arterial hypertension. 6.  Aortic Atherosclerosis (ICD10-I70.0). Electronically Signed   By: Ozell Daring M.D.   On: 07/13/2024 20:09   DG Chest Port 1 View Result Date: 07/13/2024 EXAM: 1 VIEW XRAY OF THE CHEST 07/13/2024 06:45:16 PM COMPARISON: Prior study dated 05/16/2024. CLINICAL HISTORY: Questionable sepsis - evaluate  for abnormality. FINDINGS: LUNGS AND PLEURA: Stable probable scarring seen in the right lung base. No pulmonary edema. No pleural effusion. No pneumothorax. HEART AND MEDIASTINUM: No acute abnormality of the cardiac and mediastinal silhouettes. BONES AND SOFT TISSUES: No acute osseous abnormality. IMPRESSION: 1. No acute cardiopulmonary abnormality. 2. Stable scarring in the right lung base. Electronically signed by: Lynwood Seip MD 07/13/2024 06:56 PM EDT RP Workstation: [id number]      IMPRESSION AND PLAN:  Assessment and Plan: * Acute respiratory failure with hypoxia (HCC) - This likely secondary to COPD exacerbation and cavitary pneumonia. - The patient will be admitted to a progressive unit bed. - Will continue antibiotic therapy with IV Rocephin  and Zithromax . - O2 protocol will be followed. - Management otherwise as below.  COPD with acute exacerbation (HCC) - We will place the patient IV steroid therapy with IV Solu-Medrol  as well as nebulized bronchodilator therapy with duonebs q.i.d. and q.4 hours p.r.n.SABRA - Mucolytic therapy will be provided with Mucinex and antibiotic therapy with IV Rocephin  and Zithromax . - O2 protocol will be followed.   Sepsis due to pneumonia [facility]) - The patient will be placed on IV Rocephin  and Zithromax . - Will follow blood cultures. - Sepsis manifested by tachycardia, tachypnea and fever as well as leukocytosis. - He will be hydrated with IV lactated ringer. - Mucolytic therapy and bronchodilator therapy will be provided. - Given his cavitary pneumonia will place on TB precautions and obtain a sputum for AFB. - Pulmonary consult will be obtained.  I notified Dr.Aleskerov about the patient  COVID-19 virus infection - This could be contributing to his hypoxia. - He will be placed on steroids as mentioned above. - Supportive management will otherwise be provided. - Will be on COVID-19 precautions  Essential hypertension - Will continue  antihypertensive therapy.    DVT prophylaxis: Lovenox . Advanced Care Planning:  Code Status: The patient is DNR and DNI. Family Communication:  The plan of care was discussed in details with the patient (and family). I answered all questions. The patient agreed to proceed with the above mentioned plan. Further management will depend upon hospital course. Disposition Plan: Back to previous home environment Consults called: Pulmonary consult All the records are reviewed and case discussed with ED provider.  Status is: Inpatient    At the time of the admission, it appears that the appropriate admission status for this patient is inpatient.  This is judged to be reasonable and necessary in order to provide the required intensity of service to ensure the patient's safety given the presenting symptoms, physical exam findings and initial radiographic and laboratory data in the context of comorbid conditions.  The patient requires inpatient status due to high intensity of service, high risk of further deterioration and high frequency of surveillance required.  I certify that at the time of admission, it is my clinical judgment that the patient will require inpatient hospital care extending more than 2 midnights.                            Dispo: The patient is  from: Home              Anticipated d/c is to: Home              Patient currently is not medically stable to d/c.              Difficult to place patient: No  Madison DELENA Peaches M.D on 07/14/2024 at 3:51 AM  [facility]   From 7 PM-7 AM, contact night-coverage [email or web address]  CC: Primary care physician; Clark, Katherine K, NP

## 2024-07-13 NOTE — ED Triage Notes (Signed)
 Pt to ED by EMS from Home. Pt called for SOB and coughing up green/yellow sputum for the past 5 days. Pt initially 68% on room air, placed on 15L NRB mask. Right arm BP 160/98, no chest pain. Hx of COPD.

## 2024-07-14 DIAGNOSIS — J441 Chronic obstructive pulmonary disease with (acute) exacerbation: Secondary | ICD-10-CM

## 2024-07-14 DIAGNOSIS — J9601 Acute respiratory failure with hypoxia: Secondary | ICD-10-CM

## 2024-07-14 DIAGNOSIS — U071 COVID-19: Principal | ICD-10-CM | POA: Diagnosis present

## 2024-07-14 DIAGNOSIS — A419 Sepsis, unspecified organism: Secondary | ICD-10-CM

## 2024-07-14 LAB — PROTIME-INR
INR: 1.1 (ref 0.8–1.2)
Prothrombin Time: 14.4 s (ref 11.4–15.2)

## 2024-07-14 LAB — CBC
HCT: 41.9 % (ref 39.0–52.0)
Hemoglobin: 13.2 g/dL (ref 13.0–17.0)
MCH: 30.3 pg (ref 26.0–34.0)
MCHC: 31.5 g/dL (ref 30.0–36.0)
MCV: 96.1 fL (ref 80.0–100.0)
Platelets: 188 K/uL (ref 150–400)
RBC: 4.36 MIL/uL (ref 4.22–5.81)
RDW: 13.1 % (ref 11.5–15.5)
WBC: 13 K/uL — ABNORMAL HIGH (ref 4.0–10.5)
nRBC: 0 % (ref 0.0–0.2)

## 2024-07-14 LAB — URINALYSIS, W/ REFLEX TO CULTURE (INFECTION SUSPECTED)
Bacteria, UA: NONE SEEN
Bilirubin Urine: NEGATIVE
Glucose, UA: NEGATIVE mg/dL
Hgb urine dipstick: NEGATIVE
Ketones, ur: NEGATIVE mg/dL
Leukocytes,Ua: NEGATIVE
Nitrite: NEGATIVE
Protein, ur: 30 mg/dL — AB
Specific Gravity, Urine: >1.046 — ABNORMAL HIGH (ref 1.005–1.030)
Squamous Epithelial / HPF: 0 /HPF (ref 0–5)
pH: 5 (ref 5.0–8.0)

## 2024-07-14 LAB — HEMOGLOBIN A1C
Hgb A1c MFr Bld: 6.1 % — ABNORMAL HIGH (ref 4.8–5.6)
Mean Plasma Glucose: 128.37 mg/dL

## 2024-07-14 LAB — BASIC METABOLIC PANEL WITH GFR
Anion gap: 9 (ref 5–15)
BUN: 15 mg/dL (ref 8–23)
CO2: 31 mmol/L (ref 22–32)
Calcium: 8.1 mg/dL — ABNORMAL LOW (ref 8.9–10.3)
Chloride: 94 mmol/L — ABNORMAL LOW (ref 98–111)
Creatinine, Ser: 0.82 mg/dL (ref 0.61–1.24)
GFR, Estimated: >60 mL/min (ref >60)
Glucose, Bld: 152 mg/dL — ABNORMAL HIGH (ref 70–99)
Potassium: 4.2 mmol/L (ref 3.5–5.1)
Sodium: 134 mmol/L — ABNORMAL LOW (ref 135–145)

## 2024-07-14 LAB — CORTISOL-AM, BLOOD: Cortisol - AM: 26.3 ug/dL — ABNORMAL HIGH (ref 6.7–22.6)

## 2024-07-14 LAB — CBG MONITORING, ED: Glucose-Capillary: 141 mg/dL — ABNORMAL HIGH (ref 70–99)

## 2024-07-14 LAB — HIV ANTIBODY (ROUTINE TESTING W REFLEX): HIV Screen 4th Generation wRfx: NONREACTIVE

## 2024-07-14 MED ORDER — ALBUTEROL SULFATE (2.5 MG/3ML) 0.083% IN NEBU: 3.0000 mL | INHALATION_SOLUTION | RESPIRATORY_TRACT | Status: DC | PRN

## 2024-07-14 MED ORDER — INSULIN ASPART 100 UNIT/ML IJ SOLN
0.0000 [IU] | Freq: Three times a day (TID) | INTRAMUSCULAR | Status: DC
  Administered 2024-07-14: 3 [IU] via SUBCUTANEOUS
  Filled 2024-07-14: qty 3

## 2024-07-14 MED ORDER — INSULIN ASPART 100 UNIT/ML IJ SOLN: 0.0000 [IU] | Freq: Every day | INTRAMUSCULAR | Status: DC

## 2024-07-14 MED ORDER — UMECLIDINIUM-VILANTEROL 62.5-25 MCG/ACT IN AEPB
1.0000 | INHALATION_SPRAY | Freq: Every day | RESPIRATORY_TRACT | Status: DC
  Administered 2024-07-14 – 2024-07-16 (×3): 1 via RESPIRATORY_TRACT
  Filled 2024-07-14: qty 14

## 2024-07-14 NOTE — Assessment & Plan Note (Addendum)
-   The patient will be placed on IV Rocephin  and Zithromax . - Will follow blood cultures. - Sepsis manifested by tachycardia, tachypnea and fever as well as leukocytosis. - He will be hydrated with IV lactated ringer. - Mucolytic therapy and bronchodilator therapy will be provided. - Given his cavitary pneumonia will place on TB precautions and obtain a sputum for AFB. - Pulmonary consult will be obtained.  I notified Dr.Aleskerov about the patient

## 2024-07-14 NOTE — Assessment & Plan Note (Signed)
-   Will continue antihypertensive therapy.

## 2024-07-14 NOTE — Hospital Course (Addendum)
 Hospital course / significant events:   Roy Richards. is a 63 y.o. male with medical history significant for anxiety, depression, type 2 diabetes mellitus, diverticulosis, emphysema, alcohol abuse, asthma, bipolar 2 disorder, hypertension tension and dyslipidemia, who presented to the emergency room with worsening dyspnea with associated cough and wheezing over the last couple weeks.    10/31: admitted to hospitalist for COVID, resp fail, cavitary lung lesion vs sequelae septic emboli. On rocephin , azithromycin .  11/01: remains on O2, stable, continue abx 11/02: per pulmonary, await AFB, MRSA, Fungitell. Remains on 3L [place] and significant SOB w/ minimal exertion       Consultants:  Pulmonology  Procedures/Surgeries: none      ASSESSMENT & PLAN:   Acute respiratory failure with hypoxia  D/t COPD exacerbation + pneumonia w/ cavitary lung lesions + COVID Supplemental O2  Treat underlying causes as below   COPD with acute exacerbation  IV Solu-Medrol   bronchodilator therapy with duonebs q.i.d. and q.4 hours p.r.n.. Mucinex  O2 protocol  Hold home ICS d/t pneumonia   Severe Sepsis due to pneumonia  Sepsis manifested by tachycardia, tachypnea and fever as well as leukocytosis. Severe w/ organ dysfunction demonstrated by hypoxia  IV Rocephin  and Zithromax . follow blood cultures. IV lactated ringer. bronchodilator therapy with duonebs q.i.d. and q.4 hours p.r.n.. 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   Fungitell ordered.   AFB -NAA Q8H consider outpatient langerhans histiocytosis lower down on differential  TB precautions and obtain a sputum for AFB. Pulmonary following    COVID-19  virus infection IV Solu-Medrol   bronchodilator therapy with duonebs q.i.d. and q.4 hours p.r.n.. 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 Imaging finding of uncertain clinical significance  Follow outpatient  Dilated main pulmonary artery consistent with pulmonary arterial hypertension. Follow outpatient   Essential hypertension holding home amlodipine   HLD Aortic atherosclerosis Continue home statin    Anxiety/Depression Continue home Celexa    DM2 A1C indicates good control Can dc insulin  Monitor Glc w/ am labs Hold home metformin  for now    No concerns based on BMI: Body mass index is 24.03 kg/m.Roy Richards Significantly low or high BMI is associated with higher medical risk.  Underweight - under 18  overweight - 25 to 29 obese - 30 or more Class 1 obesity: BMI of 30.0 to 34 Class 2 obesity: BMI of 35.0 to 39 Class 3 obesity: BMI of 40.0 to 49 Super Morbid Obesity: BMI 50-59 Super-super Morbid Obesity: BMI 60+ Healthy nutrition and physical activity advised as adjunct to other disease management and risk reduction treatments    DVT prophylaxis: lovenox  IV fluids: have dc continuous IV fluids  Nutrition: carb modified  Central lines / other devices: none  Code Status: DNR ACP documentation reviewed:  none on file in [facility]  TOC needs: TBD Medical barriers to dispo: hypoxia, pulm consult, cultures pending. Expected medical readiness for discharge few days.

## 2024-07-14 NOTE — Assessment & Plan Note (Signed)
-   We will place the patient IV steroid therapy with IV Solu-Medrol as well as nebulized bronchodilator therapy with duonebs q.i.d. and q.4 hours p.r.n.Marland Kitchen - Mucolytic therapy will be provided with Mucinex and antibiotic therapy with IV Rocephin and Zithromax. - O2 protocol will be followed.

## 2024-07-14 NOTE — ED Notes (Signed)
 Transportation requested

## 2024-07-14 NOTE — Consult Note (Incomplete)
 H&P This is a case of 63 year old male patient with past medical history of anxiety/depression, type 2 diabetes mellitus, diverticulosis, emphysema, alcohol abuse, asthma, bipolar disorder type II who presented to [facility] on 11/31st/2025 overnight for worsening shortness of breath associated cough and wheezing.  Reportedly this has been going on for about a week and progressively worsening.  He was found to be febrile at home.  This abruptly worsened on the night of presentation which led to his presentation to the ED.  In the ED he was noted to be hypoxic down to 84% requiring 2 to 4 L nasal cannula.  Hemodynamically stable.  Labs with a white count of 13.  Lactic acid 1.6.  Kidney function at baseline with a creatinine of 0.91 mg/dL.  Otherwise labs were unremarkable.  Microdata -COVID-19 positive.  Blood cultures negative so far.  CT angio chest was obtained and did not show any evidence of pulmonary emboli.  There was small nodular airspace opacities scattered throughout the lung with central cavitation.  Physical exam GEN HEENT CVS Abdomen Extremities  Labs and imaging were reviewed  Assessment and plan This is a case of 63 year old male patient with past medical history of anxiety/depression, type 2 diabetes mellitus, diverticulosis, emphysema, alcohol abuse, asthma, bipolar disorder type II who presented to [facility] on 11/31st/2025 overnight for worsening shortness of breath associated cough and wheezing.

## 2024-07-14 NOTE — Assessment & Plan Note (Signed)
-   This likely secondary to COPD exacerbation and cavitary pneumonia. - The patient will be admitted to a progressive unit bed. - Will continue antibiotic therapy with IV Rocephin  and Zithromax . - O2 protocol will be followed. - Management otherwise as below.

## 2024-07-14 NOTE — Plan of Care (Signed)
  Problem: Education: Goal: Knowledge of risk factors and measures for prevention of condition will improve Outcome: Progressing   Problem: Coping: Goal: Psychosocial and spiritual needs will be supported Outcome: Progressing   Problem: Respiratory: Goal: Will maintain a patent airway Outcome: Progressing Goal: Complications related to the disease process, condition or treatment will be avoided or minimized Outcome: Progressing   Problem: Fluid Volume: Goal: Hemodynamic stability will improve Outcome: Progressing   Problem: Clinical Measurements: Goal: Diagnostic test results will improve Outcome: Progressing Goal: Signs and symptoms of infection will decrease Outcome: Progressing   Problem: Respiratory: Goal: Ability to maintain adequate ventilation will improve Outcome: Progressing   Problem: Education: Goal: Ability to describe self-care measures that may prevent or decrease complications (Diabetes Survival Skills Education) will improve Outcome: Progressing Goal: Individualized Educational Video(s) Outcome: Progressing   Problem: Coping: Goal: Ability to adjust to condition or change in health will improve Outcome: Progressing   Problem: Fluid Volume: Goal: Ability to maintain a balanced intake and output will improve Outcome: Progressing   Problem: Health Behavior/Discharge Planning: Goal: Ability to identify and utilize available resources and services will improve Outcome: Progressing Goal: Ability to manage health-related needs will improve Outcome: Progressing   Problem: Metabolic: Goal: Ability to maintain appropriate glucose levels will improve Outcome: Progressing   Problem: Nutritional: Goal: Maintenance of adequate nutrition will improve Outcome: Progressing Goal: Progress toward achieving an optimal weight will improve Outcome: Progressing   Problem: Skin Integrity: Goal: Risk for impaired skin integrity will decrease Outcome: Progressing    Problem: Tissue Perfusion: Goal: Adequacy of tissue perfusion will improve Outcome: Progressing   Problem: Education: Goal: Knowledge of General Education information will improve Description: Including pain rating scale, medication(s)/side effects and non-pharmacologic comfort measures Outcome: Progressing   Problem: Health Behavior/Discharge Planning: Goal: Ability to manage health-related needs will improve Outcome: Progressing   Problem: Clinical Measurements: Goal: Ability to maintain clinical measurements within normal limits will improve Outcome: Progressing Goal: Will remain free from infection Outcome: Progressing Goal: Diagnostic test results will improve Outcome: Progressing Goal: Respiratory complications will improve Outcome: Progressing Goal: Cardiovascular complication will be avoided Outcome: Progressing   Problem: Activity: Goal: Risk for activity intolerance will decrease Outcome: Progressing   Problem: Nutrition: Goal: Adequate nutrition will be maintained Outcome: Progressing   Problem: Coping: Goal: Level of anxiety will decrease Outcome: Progressing   Problem: Elimination: Goal: Will not experience complications related to bowel motility Outcome: Progressing Goal: Will not experience complications related to urinary retention Outcome: Progressing   Problem: Pain Managment: Goal: General experience of comfort will improve and/or be controlled Outcome: Progressing   Problem: Safety: Goal: Ability to remain free from injury will improve Outcome: Progressing   Problem: Skin Integrity: Goal: Risk for impaired skin integrity will decrease Outcome: Progressing

## 2024-07-14 NOTE — Assessment & Plan Note (Addendum)
-   This could be contributing to his hypoxia. - He will be placed on steroids as mentioned above. - Supportive management will otherwise be provided. - Will be on COVID-19 precautions

## 2024-07-14 NOTE — Progress Notes (Addendum)
 PROGRESS NOTE    Roy Richards.   FMW:[id number] DOB: October 16, 1960  DOA: 07/13/2024 Date of Service: 07/14/24 which is hospital day 1  PCP: [name], NP    Hospital course / significant events:   Roy Richards. is a 63 y.o. male with medical history significant for anxiety, depression, type 2 diabetes mellitus, diverticulosis, emphysema, alcohol abuse, asthma, bipolar 2 disorder, hypertension tension and dyslipidemia, who presented to the emergency room with acute onset of worsening dyspnea with associated cough and wheezing over the last couple weeks.  He has been having tactile fever and chills.  No chest pain or palpitations.  He does not recognize any recent exposure to COVID-19.  He denies any loss of taste or smell.  He has been having diarrhea over the last couple days without nausea or vomiting.  No melena or bright red bleeding per rectum.  No dysuria, oliguria or hematuria or flank pain.   ED Course: When he came to the ER, temperature was 100.9/38.3, respiratory rate 22, pulse currently 92% on 2 L of O2 via nasal cannula.  He was down to 87% later on and up to 99% on 5 L of O2 by nasal cannula.  Labs revealed VBG with pH 7.38 and HCO3 35.5.  Chloride was 92 and calcium  8.6 with otherwise unremarkable CMP.  Lactic acid was 1.4 and later 1.6.  CBC showed leukocytosis 13 with neutrophilia.  Respiratory panel came back positive for COVID-19.  UA showed more than 1046 specific gravity and 30 protein.  Blood cultures were drawn. EKG as reviewed by me : EKG showed normal sinus rhythm with a rate of 89 with PVCs, right axis deviation and Q waves anteroseptally. Imaging: Portable chest x-ray showed stable scarring in the right lung base with no acute cardiopulmonary disease. Chest CTA showed the following: 1. No evidence of acute pulmonary embolus. 2. Small nodular airspace opacities scattered throughout the lungs bilaterally, demonstrating areas of central cavitation. Findings  are consistent with atypical cavitating pneumonia versus sequela of septic emboli. 3. Subcentimeter mediastinal and hilar lymph nodes consistent with reactive adenopathy. No pathologically enlarged lymph nodes are identified. 4. 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 5. Dilated main pulmonary artery consistent with pulmonary arterial hypertension. 6.  Aortic Atherosclerosis.   The patient was given 50 mg IV Toradol, DuoNeb twice and 2 g with IV Rocephin , 500 mg of IV Zithromax  as well as 2 L bolus of IV lactated ringer.  She will be admitted to a progressive unit bed for further evaluation and management.  07/14/24 pt reports feeling a bit better, still tired. Remains on O2, still significant wheezing on exam     Consultants:  Pulmonology  Procedures/Surgeries: none      ASSESSMENT & PLAN:   Acute respiratory failure with hypoxia  COPD exacerbation  cavitary pneumonia COVID Supplemental O2  Treat underlying causes as below   COPD with acute exacerbation  IV Solu-Medrol   bronchodilator therapy with duonebs q.i.d. and q.4 hours p.r.n.. Mucinex  O2 protocol  Hold home ICS d/t pneumonia   Severe Sepsis due to pneumonia  Cavitary lung lesion Sepsis manifested by tachycardia, tachypnea and fever as well as leukocytosis. Severe w/ organ dysfunction demonstrated by hypoxia  IV Rocephin  and Zithromax . follow blood cultures. IV lactated ringer. bronchodilator therapy with duonebs q.i.d. and q.4 hours p.r.n.. Mucinex  TB precautions  and obtain a sputum for AFB. Pulmonary consult - I d/w Dr Malka   COVID-19 virus infection IV Solu-Medrol   bronchodilator therapy with duonebs q.i.d. and q.4 hours p.r.n.. Mucinex  O2 protocol  COVID-19 precautions   Essential hypertension holding home amlodipine   HLD Continue home  statin    Anxiety/Depression Continue home Celexa    DM2 A1C indicates good control Can dc insulin  Monitor Glc w/ am labs Hold home metformin  for now    No concerns based on BMI: Body mass index is 24.03 kg/m.SABRA Significantly low or high BMI is associated with higher medical risk.  Underweight - under 18  overweight - 25 to 29 obese - 30 or more Class 1 obesity: BMI of 30.0 to 34 Class 2 obesity: BMI of 35.0 to 39 Class 3 obesity: BMI of 40.0 to 49 Super Morbid Obesity: BMI 50-59 Super-super Morbid Obesity: BMI 60+ Healthy nutrition and physical activity advised as adjunct to other disease management and risk reduction treatments    DVT prophylaxis: lovenox  IV fluids: have dc continuous IV fluids  Nutrition: carb modified  Central lines / other devices: none  Code Status: DNR ACP documentation reviewed:  none on file in [facility]  TOC needs: TBD Medical barriers to dispo: hypoxia, pulm consult, cultures pending. Expected medical readiness for discharge few days.              Subjective / Brief ROS:  Patient reports tired today, breathing a little bit easier today Denies CP/SOB.  Pain controlled.  Denies new weakness.  Tolerating diet.  Reports no concerns w/ urination/defecation.   Family Communication: none at this time     Objective Findings:  Vitals:   07/14/24 1100 07/14/24 1200 07/14/24 1231 07/14/24 1358  BP: 115/61 (!) 106/57  (!) 141/58  Pulse: 67 67 87 85  Resp: 18 18  16   Temp:    98.1 F (36.7 C)  TempSrc:      SpO2: 97% 97% 95% (!) 89%  Weight:      Height:        Intake/Output Summary (Last 24 hours) at 07/14/2024 1544 Last data filed at 07/13/2024 2108 Gross per 24 hour  Intake 2000 ml  Output --  Net 2000 ml   Filed Weights   07/13/24 1825  Weight: 63.5 kg    Examination:  Physical Exam Constitutional:      General: He is not in acute distress.    Appearance: He is not ill-appearing.  Cardiovascular:     Rate and  Rhythm: Normal rate and regular rhythm.  Pulmonary:     Breath sounds: Wheezing and rhonchi present.  Musculoskeletal:     Right lower leg: No edema.     Left lower leg: No edema.  Skin:    General: Skin is warm and dry.  Neurological:     Mental Status: He is alert.          Scheduled Medications:   atorvastatin   40 mg Oral Daily   cholecalciferol  2,000 Units Oral Daily   citalopram   40 mg Oral Daily   enoxaparin  (LOVENOX ) injection  40 mg Subcutaneous Q24H   guaiFENesin  600 mg Oral BID   ipratropium-albuterol   3 mL Nebulization Once   losartan   50 mg Oral Daily   methylPREDNISolone  (SOLU-MEDROL ) injection  80 mg Intravenous Daily   umeclidinium-vilanterol  1 puff Inhalation Daily    Continuous Infusions:  azithromycin      cefTRIAXone  (ROCEPHIN )  IV      PRN  Medications:  acetaminophen  **OR** acetaminophen , albuterol , chlorpheniramine-HYDROcodone, magnesium hydroxide, ondansetron  **OR** ondansetron  (ZOFRAN ) IV, traZODone  Antimicrobials from admission:  Anti-infectives (From admission, onward)    Start     Dose/Rate Route Frequency Ordered Stop   07/14/24 2200  azithromycin  (ZITHROMAX ) 500 mg in sodium chloride  0.9 % 250 mL IVPB        500 mg 250 mL/hr over 60 Minutes Intravenous Every 24 hours 07/13/24 2215 07/18/24 2159   07/14/24 2000  cefTRIAXone  (ROCEPHIN ) 2 g in sodium chloride  0.9 % 100 mL IVPB        2 g 200 mL/hr over 30 Minutes Intravenous Every 24 hours 07/13/24 2215 07/18/24 1959   07/13/24 1830  cefTRIAXone  (ROCEPHIN ) 2 g in sodium chloride  0.9 % 100 mL IVPB        2 g 200 mL/hr over 30 Minutes Intravenous Once 07/13/24 1827 07/13/24 1956   07/13/24 1830  azithromycin  (ZITHROMAX ) 500 mg in sodium chloride  0.9 % 250 mL IVPB        500 mg 250 mL/hr over 60 Minutes Intravenous  Once 07/13/24 1827 07/13/24 2203           Data Reviewed:  I have personally reviewed the following...  CBC: Recent Labs  Lab 07/13/24 1843 07/14/24 0453  WBC  13.0* 13.0*  NEUTROABS 10.5*  --   HGB 14.1 13.2  HCT 43.9 41.9  MCV 94.6 96.1  PLT 215 188   Basic Metabolic Panel: Recent Labs  Lab 07/13/24 1843 07/14/24 0453  NA 135 134*  K 4.0 4.2  CL 92* 94*  CO2 31 31  GLUCOSE 124* 152*  BUN 12 15  CREATININE 0.91 0.82  CALCIUM  8.6* 8.1*   GFR: Estimated Creatinine Clearance: 75.2 mL/min (by C-G formula based on SCr of 0.82 mg/dL). Liver Function Tests: Recent Labs  Lab 07/13/24 1843  AST 39  ALT 30  ALKPHOS 76  BILITOT 0.7  PROT 7.5  ALBUMIN 3.5   No results for input(s): LIPASE, AMYLASE in the last 168 hours. No results for input(s): AMMONIA in the last 168 hours. Coagulation Profile: Recent Labs  Lab 07/13/24 1843 07/14/24 0453  INR 1.1 1.1   Cardiac Enzymes: No results for input(s): CKTOTAL, CKMB, CKMBINDEX, TROPONINI in the last 168 hours. BNP (last 3 results) No results for input(s): PROBNP in the last 8760 hours. HbA1C: Recent Labs    07/14/24 0453  HGBA1C 6.1*   CBG: Recent Labs  Lab 07/14/24 0731  GLUCAP 141*   Lipid Profile: No results for input(s): CHOL, HDL, LDLCALC, TRIG, CHOLHDL, LDLDIRECT in the last 72 hours. Thyroid Function Tests: No results for input(s): TSH, T4TOTAL, FREET4, T3FREE, THYROIDAB in the last 72 hours. Anemia Panel: No results for input(s): VITAMINB12, FOLATE, FERRITIN, TIBC, IRON, RETICCTPCT in the last 72 hours. Most Recent Urinalysis On File:     Component Value Date/Time   COLORURINE YELLOW (A) 07/14/2024 0109   APPEARANCEUR CLEAR (A) 07/14/2024 0109   LABSPEC >1.046 (H) 07/14/2024 0109   PHURINE 5.0 07/14/2024 0109   GLUCOSEU NEGATIVE 07/14/2024 0109   HGBUR NEGATIVE 07/14/2024 0109   BILIRUBINUR NEGATIVE 07/14/2024 0109   KETONESUR NEGATIVE 07/14/2024 0109   PROTEINUR 30 (A) 07/14/2024 0109   NITRITE NEGATIVE 07/14/2024 0109   LEUKOCYTESUR NEGATIVE 07/14/2024 0109   Sepsis  Labs: @LABRCNTIP (procalcitonin:4,lacticidven:4) Microbiology: Recent Results (from the past 240 hours)  Resp panel by RT-PCR (RSV, Flu A&B, Covid) Anterior Nasal Swab     Status: Abnormal   Collection Time: 07/13/24  6:43  PM   Specimen: Anterior Nasal Swab  Result Value Ref Range Status   SARS Coronavirus 2 by RT PCR POSITIVE (A) NEGATIVE Final    Comment: (NOTE) SARS-CoV-2 target nucleic acids are DETECTED.  The SARS-CoV-2 RNA is generally detectable in upper respiratory specimens during the acute phase of infection. Positive results are indicative of the presence of the identified virus, but do not rule out bacterial infection or co-infection with other pathogens not detected by the test. Clinical correlation with patient history and other diagnostic information is necessary to determine patient infection status. The expected result is Negative.  Fact Sheet for Patients: bloggercourse.com  Fact Sheet for Healthcare Providers: seriousbroker.it  This test is not yet approved or cleared by the [place]  FDA and  has been authorized for detection and/or diagnosis of SARS-CoV-2 by FDA under an Emergency Use Authorization (EUA).  This EUA will remain in effect (meaning this test can be used) for the duration of  the COVID-19 declaration under Section 564(b)(1) of the A ct, 21 U.S.C. section 360bbb-3(b)(1), unless the authorization is terminated or revoked sooner.     Influenza A by PCR NEGATIVE NEGATIVE Final   Influenza B by PCR NEGATIVE NEGATIVE Final    Comment: (NOTE) The Xpert Xpress SARS-CoV-2/FLU/RSV plus assay is intended as an aid in the diagnosis of influenza from Nasopharyngeal swab specimens and should not be used as a sole basis for treatment. Nasal washings and aspirates are unacceptable for Xpert Xpress SARS-CoV-2/FLU/RSV testing.  Fact Sheet for Patients: bloggercourse.com  Fact  Sheet for Healthcare Providers: seriousbroker.it  This test is not yet approved or cleared by the [place]  FDA and has been authorized for detection and/or diagnosis of SARS-CoV-2 by FDA under an Emergency Use Authorization (EUA). This EUA will remain in effect (meaning this test can be used) for the duration of the COVID-19 declaration under Section 564(b)(1) of the Act, 21 U.S.C. section 360bbb-3(b)(1), unless the authorization is terminated or revoked.     Resp Syncytial Virus by PCR NEGATIVE NEGATIVE Final    Comment: (NOTE) Fact Sheet for Patients: bloggercourse.com  Fact Sheet for Healthcare Providers: seriousbroker.it  This test is not yet approved or cleared by the [place]  FDA and has been authorized for detection and/or diagnosis of SARS-CoV-2 by FDA under an Emergency Use Authorization (EUA). This EUA will remain in effect (meaning this test can be used) for the duration of the COVID-19 declaration under Section 564(b)(1) of the Act, 21 U.S.C. section 360bbb-3(b)(1), unless the authorization is terminated or revoked.  Performed at [facility], [address] [place], [place] [address]   Blood Culture (routine x 2)     Status: None (Preliminary result)   Collection Time: 07/13/24  6:52 PM   Specimen: BLOOD  Result Value Ref Range Status   Specimen Description BLOOD BLOOD LEFT ARM  Final   Special Requests   Final    BOTTLES DRAWN AEROBIC AND ANAEROBIC Blood Culture results may not be optimal due to an inadequate volume of blood received in culture bottles   Culture   Final    NO GROWTH < 12 HOURS Performed at [facility], [address] [place], [place] [address]    Report Status PENDING  Incomplete  Blood Culture (routine x 2)     Status: None (Preliminary result)   Collection Time: 07/13/24  6:52 PM   Specimen: BLOOD  Result Value Ref Range Status    Specimen Description BLOOD BLOOD RIGHT ARM  Final  Special Requests   Final    BOTTLES DRAWN AEROBIC AND ANAEROBIC Blood Culture adequate volume   Culture   Final    NO GROWTH < 12 HOURS Performed at [facility], [address] [place], [place] [address]    Report Status PENDING  Incomplete      Radiology Studies last 3 days: CT Angio Chest PE W and/or Wo Contrast Result Date: 07/13/2024 CLINICAL DATA:  Febrile, hypoxia, productive cough EXAM: CT ANGIOGRAPHY CHEST WITH CONTRAST TECHNIQUE: Multidetector CT imaging of the chest was performed using the standard protocol during bolus administration of intravenous contrast. Multiplanar CT image reconstructions and MIPs were obtained to evaluate the vascular anatomy. RADIATION DOSE REDUCTION: This exam was performed according to the departmental dose-optimization program which includes automated exposure control, adjustment of the mA and/or kV according to patient size and/or use of iterative reconstruction technique. CONTRAST:  75mL OMNIPAQUE  IOHEXOL  350 MG/ML SOLN COMPARISON:  07/13/2024, 05/23/2023 FINDINGS: Cardiovascular: This is a technically adequate evaluation of the pulmonary vasculature. No filling defects or pulmonary emboli. Dilated main pulmonary artery measuring 3.6 cm consistent with pulmonary arterial hypertension. The heart is unremarkable without pericardial effusion. No evidence of thoracic aortic aneurysm or dissection. There is atherosclerosis of the thoracic aorta. 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 Mediastinum/Nodes: Subcentimeter mediastinal and hilar lymph nodes are identified, measuring up to 9 mm in the precarinal region. Thyroid, trachea, and esophagus are unremarkable. Lungs/Pleura: Nodular airspace disease is seen diffusely  throughout the bilateral lungs, with many of the small nodular areas of consolidation demonstrating central cavitation. Findings are consistent with atypical cavitating pneumonia, versus sequela of septic emboli. No effusion or pneumothorax. Central airways are patent. Upper Abdomen: No acute abnormality. Musculoskeletal: No acute or destructive bony abnormalities. Reconstructed images demonstrate no additional findings. Review of the MIP images confirms the above findings. IMPRESSION: 1. No evidence of acute pulmonary embolus. 2. Small nodular airspace opacities scattered throughout the lungs bilaterally, demonstrating areas of central cavitation. Findings are consistent with atypical cavitating pneumonia versus sequela of septic emboli. 3. Subcentimeter mediastinal and hilar lymph nodes consistent with reactive adenopathy. No pathologically enlarged lymph nodes are identified. 4. 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 5. Dilated main pulmonary artery consistent with pulmonary arterial hypertension. 6.  Aortic Atherosclerosis (ICD10-I70.0). Electronically Signed   By: Ozell Daring M.D.   On: 07/13/2024 20:09   DG Chest Port 1 View Result Date: 07/13/2024 EXAM: 1 VIEW XRAY OF THE CHEST 07/13/2024 06:45:16 PM COMPARISON: Prior study dated 05/16/2024. CLINICAL HISTORY: Questionable sepsis - evaluate for abnormality. FINDINGS: LUNGS AND PLEURA: Stable probable scarring seen in the right lung base. No pulmonary edema. No pleural effusion. No pneumothorax. HEART AND MEDIASTINUM: No acute abnormality of the cardiac and mediastinal silhouettes. BONES AND SOFT TISSUES: No acute osseous abnormality. IMPRESSION: 1. No acute cardiopulmonary abnormality. 2. Stable scarring in the right lung base. Electronically signed by: Lynwood Seip MD 07/13/2024 06:56 PM EDT RP Workstation:  [id number]         Laneta Blunt, DO [facility] 07/14/2024, 3:44 PM    Dictation software may have been used to generate the above note. Typos may occur and escape review in typed/dictated notes. Please contact Dr Blunt directly for  clarity if needed.  Staff may message me via secure chat in Epic  but this may not receive an immediate response,  please page me for urgent matters!  If 7PM-7AM, please contact night coverage [email or web address]

## 2024-07-15 DIAGNOSIS — J9601 Acute respiratory failure with hypoxia: Secondary | ICD-10-CM | POA: Diagnosis not present

## 2024-07-15 LAB — EXPECTORATED SPUTUM ASSESSMENT W GRAM STAIN, RFLX TO RESP C

## 2024-07-15 LAB — ACID FAST SMEAR (AFB, MYCOBACTERIA): Acid Fast Smear: NEGATIVE

## 2024-07-15 LAB — STREP PNEUMONIAE URINARY ANTIGEN: Strep Pneumo Urinary Antigen: NEGATIVE

## 2024-07-15 MED ORDER — AZITHROMYCIN 250 MG PO TABS
500.0000 mg | ORAL_TABLET | Freq: Every day | ORAL | Status: DC
  Administered 2024-07-15: 500 mg via ORAL
  Filled 2024-07-15: qty 2

## 2024-07-15 NOTE — Care Management Important Message (Signed)
 Important Message  Patient Details  Name: Roy Richards. MRN: [id number] Date of Birth: August 02, 1961   Important Message Given:  Yes - Medicare IM     [name] 07/15/2024, 6:00 PM

## 2024-07-15 NOTE — Plan of Care (Signed)
  Problem: Education: Goal: Knowledge of risk factors and measures for prevention of condition will improve Outcome: Progressing   Problem: Coping: Goal: Psychosocial and spiritual needs will be supported Outcome: Progressing   Problem: Respiratory: Goal: Will maintain a patent airway Outcome: Progressing Goal: Complications related to the disease process, condition or treatment will be avoided or minimized Outcome: Progressing   Problem: Fluid Volume: Goal: Hemodynamic stability will improve Outcome: Progressing

## 2024-07-15 NOTE — Consult Note (Signed)
 PULMONOLOGY         Date: 07/15/2024,   MRN# [id number] Roy Richards. 1961-09-09     AdmissionWeight: 63.5 kg                 CurrentWeight: 63.5 kg  Referring provider: Dr Lawence    CHIEF COMPLAINT:   Cavitary lung disease    HISTORY OF PRESENT ILLNESS   This is a 63 year old male with a history of colitis, anxiety disorder asthma, attention deficit hyperactivity disorder, bipolar disorder cocaine dependence, depression, dyslipidemia chronic emphysema hypertension nocturnal hypoxemia alcohol dependence, came in via ER with complaints of fevers, dyspnea, cough was found to have hypoxemia requiring 5 L of oxygen via nasal cannula, mild lactic acidosis leukocytosis.  COVID-19 panel was positive on arrival.  CT chest was performed showing cavitary lung disease with mediastinal and hilar adenopathy.  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  Patient was empirically started on Rocephin  and Zithromax  for community-acquired pneumonia, provided IV fluid resuscitation via LR.  PCCM consultation for further evaluation management of cavitary lung disease.   PAST MEDICAL HISTORY   Past Medical History:  Diagnosis Date   Acute colitis 06/26/2019   Anxiety 2021   Building collapse   Asthma    Attention deficit hyperactivity disorder, combined type 09/12/2019   Bipolar II disorder (HCC) 09/12/2019   Cocaine dependence in remission (HCC) 09/12/2019   Contusion of abdominal wall 06/13/2023   Depression    Diabetes mellitus without complication (HCC)    Diverticular disease of colon 06/17/2023   Emphysema of lung (HCC) 2015   History of colon polyps 06/17/2023   Hyperlipidemia    Hypertension    Hypoxia 05/07/2020   Uncomplicated alcohol dependence  (HCC) 09/12/2019     SURGICAL HISTORY   Past Surgical History:  Procedure Laterality Date   BIOPSY  08/09/2023   Procedure: BIOPSY;  Surgeon: Therisa Bi, MD;  Location: [facility] ENDOSCOPY;  Service: Gastroenterology;;   COLONOSCOPY WITH PROPOFOL  N/A 08/09/2023   Procedure: COLONOSCOPY WITH PROPOFOL ;  Surgeon: Therisa Bi, MD;  Location: [facility] ENDOSCOPY;  Service: Gastroenterology;  Laterality: N/A;   HERNIA REPAIR     POLYPECTOMY  08/09/2023   Procedure: POLYPECTOMY;  Surgeon: Therisa Bi, MD;  Location: [facility] ENDOSCOPY;  Service: Gastroenterology;;     FAMILY HISTORY   Family History  Problem Relation Age of Onset   COPD Mother    Heart attack Father 59   Heart disease Father    Hyperlipidemia Father    Hypertension Father      SOCIAL HISTORY   Social History   Tobacco Use   Smoking status: Former    Current packs/day: 0.50    Average packs/day: 0.5 packs/day for 26.1 years (13.0 ttl pk-yrs)    Types: Cigarettes    Start date: 12/15/2022   Smokeless tobacco: Never  Vaping Use   Vaping status: Never Used  Substance Use Topics   Alcohol use: Yes    Comment: occasionally   Drug use: Never     MEDICATIONS    Home Medication:    Current Medication:  Current Facility-Administered Medications:    acetaminophen  (TYLENOL ) tablet 650 mg, 650 mg, Oral, Q6H PRN **OR** acetaminophen  (TYLENOL ) suppository 650 mg, 650 mg, Rectal, Q6H PRN, Mansy, Madison LABOR, MD   albuterol  (  PROVENTIL ) (2.5 MG/3ML) 0.083% nebulizer solution 3 mL, 3 mL, Inhalation, Q4H PRN, Alexander, Natalie, DO   atorvastatin  (LIPITOR) tablet 40 mg, 40 mg, Oral, Daily, Mansy, Jan A, MD, 40 mg at 07/15/24 0808   azithromycin  (ZITHROMAX ) tablet 500 mg, 500 mg, Oral, QHS, Chappell, Alex B, RPH   cefTRIAXone  (ROCEPHIN ) 2 g in sodium chloride  0.9 % 100 mL IVPB, 2 g, Intravenous, Q24H, Mansy, Jan A, MD, Last Rate: 200 mL/hr at 07/14/24 2206, 2 g at 07/14/24 2206   chlorpheniramine-HYDROcodone (TUSSIONEX) 10-8 MG/5ML  suspension 5 mL, 5 mL, Oral, Q12H PRN, Mansy, Jan A, MD   cholecalciferol (VITAMIN D3) tablet 2,000 Units, 2,000 Units, Oral, Daily, Mansy, Jan A, MD, 2,000 Units at 07/15/24 9189   citalopram  (CELEXA ) tablet 40 mg, 40 mg, Oral, Daily, Mansy, Jan A, MD, 40 mg at 07/15/24 0808   enoxaparin  (LOVENOX ) injection 40 mg, 40 mg, Subcutaneous, Q24H, Mansy, Jan A, MD, 40 mg at 07/15/24 9191   guaiFENesin (MUCINEX) 12 hr tablet 600 mg, 600 mg, Oral, BID, Mansy, Jan A, MD, 600 mg at 07/15/24 0808   losartan  (COZAAR ) tablet 50 mg, 50 mg, Oral, Daily, Mansy, Jan A, MD, 50 mg at 07/15/24 0808   magnesium hydroxide (MILK OF MAGNESIA) suspension 30 mL, 30 mL, Oral, Daily PRN, Mansy, Jan A, MD   methylPREDNISolone  sodium succinate (SOLU-MEDROL ) 125 mg/2 mL injection 80 mg, 80 mg, Intravenous, Daily, Mansy, Jan A, MD, 80 mg at 07/15/24 0808   ondansetron  (ZOFRAN ) tablet 4 mg, 4 mg, Oral, Q6H PRN **OR** ondansetron  (ZOFRAN ) injection 4 mg, 4 mg, Intravenous, Q6H PRN, Mansy, Jan A, MD   traZODone (DESYREL) tablet 25 mg, 25 mg, Oral, QHS PRN, Mansy, Jan A, MD   umeclidinium-vilanterol (ANORO ELLIPTA) 62.5-25 MCG/ACT 1 puff, 1 puff, Inhalation, Daily, Marsa Edelman, DO, 1 puff at 07/15/24 0810    ALLERGIES   Patient has no known allergies.     REVIEW OF SYSTEMS    Review of Systems:  Gen:  Denies  fever, sweats, chills weigh loss  HEENT: Denies blurred vision, double vision, ear pain, eye pain, hearing loss, nose bleeds, sore throat Cardiac:  No dizziness, chest pain or heaviness, chest tightness,edema Resp:   reports dyspnea chronically  Gi: Denies swallowing difficulty, stomach pain, nausea or vomiting, diarrhea, constipation, bowel incontinence Gu:  Denies bladder incontinence, burning urine Ext:   Denies Joint pain, stiffness or swelling Skin: Denies  skin rash, easy bruising or bleeding or hives Endoc:  Denies polyuria, polydipsia , polyphagia or weight change Psych:   Denies depression,  insomnia or hallucinations   Other:  All other systems negative   VS: BP (!) 143/67 (BP Location: Right Arm)   Pulse 81   Temp 98.3 F (36.8 C) (Oral)   Resp 18   Ht 5' 4 (1.626 m)   Wt 63.5 kg   SpO2 99%   BMI 24.03 kg/m      PHYSICAL EXAM    GENERAL:NAD, no fevers, chills, no weakness no fatigue HEAD: Normocephalic, atraumatic.  EYES: Pupils equal, round, reactive to light. Extraocular muscles intact. No scleral icterus.  MOUTH: Moist mucosal membrane. Dentition intact. No abscess noted.  EAR, NOSE, THROAT: Clear without exudates. No external lesions.  NECK: Supple. No thyromegaly. No nodules. No JVD.  PULMONARY: decreased breath sounds with mild rhonchi worse at bases bilaterally.  CARDIOVASCULAR: S1 and S2. Regular rate and rhythm. No murmurs, rubs, or gallops. No edema. Pedal pulses 2+ bilaterally.  GASTROINTESTINAL: Soft, nontender, nondistended. No masses.  Positive bowel sounds. No hepatosplenomegaly.  MUSCULOSKELETAL: No swelling, clubbing, or edema. Range of motion full in all extremities.  NEUROLOGIC: Cranial nerves II through XII are intact. No gross focal neurological deficits. Sensation intact. Reflexes intact.  SKIN: No ulceration, lesions, rashes, or cyanosis. Skin warm and dry. Turgor intact.  PSYCHIATRIC: Mood, affect within normal limits. The patient is awake, alert and oriented x 3. Insight, judgment intact.       IMAGING    rrative & Impression  CLINICAL DATA:  Febrile, hypoxia, productive cough   EXAM: CT ANGIOGRAPHY CHEST WITH CONTRAST   TECHNIQUE: Multidetector CT imaging of the chest was performed using the standard protocol during bolus administration of intravenous contrast. Multiplanar CT image reconstructions and MIPs were obtained to evaluate the vascular anatomy.   RADIATION DOSE REDUCTION: This exam was performed according to the departmental dose-optimization program which includes automated exposure control, adjustment of the mA  and/or kV according to patient size and/or use of iterative reconstruction technique.   CONTRAST:  75mL OMNIPAQUE  IOHEXOL  350 MG/ML SOLN   COMPARISON:  07/13/2024, 05/23/2023   FINDINGS: Cardiovascular: This is a technically adequate evaluation of the pulmonary vasculature. No filling defects or pulmonary emboli. Dilated main pulmonary artery measuring 3.6 cm consistent with pulmonary arterial hypertension.   The heart is unremarkable without pericardial effusion. No evidence of thoracic aortic aneurysm or dissection. There is atherosclerosis of the thoracic aorta. 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   Mediastinum/Nodes: Subcentimeter mediastinal and hilar lymph nodes are identified, measuring up to 9 mm in the precarinal region. Thyroid, trachea, and esophagus are unremarkable.   Lungs/Pleura: Nodular airspace disease is seen diffusely throughout the bilateral lungs, with many of the small nodular areas of consolidation demonstrating central cavitation. Findings are consistent with atypical cavitating pneumonia, versus sequela of septic emboli. No effusion or pneumothorax. Central airways are patent.   Upper Abdomen: No acute abnormality.   Musculoskeletal: No acute or destructive bony abnormalities. Reconstructed images demonstrate no additional findings.   Review of the MIP images confirms the above findings.   IMPRESSION: 1. No evidence of acute pulmonary embolus. 2. Small nodular airspace opacities scattered throughout the lungs bilaterally, demonstrating areas of central cavitation. Findings are consistent with atypical cavitating pneumonia versus sequela of septic emboli. 3. Subcentimeter mediastinal and hilar lymph nodes consistent with reactive adenopathy. No pathologically  enlarged lymph nodes are identified. 4. 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 5. Dilated main pulmonary artery consistent with pulmonary arterial hypertension. 6.  Aortic Atherosclerosis (ICD10-I70.0).     Electronically Signed   By: Ozell Daring M.D.   On: 07/13/2024 20:09   ASSESSMENT/PLAN   Cavitary lung disease        - patient currently with COVID19        - AFB sputum smears have been ordered         - leukocytosis is improved         - blood cultures in process        - miliary pattern with cavitary micronodules, possible septic embolic disease vs fungal infection.  MRSA pcr in process.  Fungitell ordered.         - Leukocytosis is improved        -  AFB -NAA Q8H        -hx of cocaine smoking         - consider outpatient langerhans histiocytosis lower down on differential             Cocaine abuse      - this may also cause cavitary lung process with overlying emphysematous changes from tobacco abuse    Alcoholism history     May need CIWA         Thank you for allowing me to participate in the care of this patient.   Patient/Family are satisfied with care plan and all questions have been answered.    Provider disclosure: Patient with at least one acute or chronic illness or injury that poses a threat to life or bodily function and is being managed actively during this encounter.  All of the below services have been performed independently by signing provider:  review of prior documentation from internal and or external health records.  Review of previous and current lab results.  Interview and comprehensive assessment during patient visit today. Review of current and previous chest radiographs/CT scans. Discussion of management and test interpretation with health care team and patient/family.   This document  was prepared using Dragon voice recognition software and may include unintentional dictation errors.     Drenda Sobecki, M.D.  Division of Pulmonary & Critical Care Medicine

## 2024-07-15 NOTE — Progress Notes (Signed)
 PROGRESS NOTE    Roy Richards.   FMW:[id number] DOB: 1961-07-09  DOA: 07/13/2024 Date of Service: 07/15/24 which is hospital day 2  PCP: [name], NP    Hospital course / significant events:   Roy Richards. is a 63 y.o. male with medical history significant for anxiety, depression, type 2 diabetes mellitus, diverticulosis, emphysema, alcohol abuse, asthma, bipolar 2 disorder, hypertension tension and dyslipidemia, who presented to the emergency room with worsening dyspnea with associated cough and wheezing over the last couple weeks.    10/31: admitted to hospitalist for COVID, resp fail, cavitary lung lesion vs sequelae septic emboli. On rocephin , azithromycin .  11/01: remains on O2, stable, continue abx 11/02: per pulmonary, await AFB, MRSA, Fungitell. Remains on 3L [place] and significant SOB w/ minimal exertion       Consultants:  Pulmonology  Procedures/Surgeries: none      ASSESSMENT & PLAN:   Acute respiratory failure with hypoxia  D/t COPD exacerbation + pneumonia w/ cavitary lung lesions + COVID Supplemental O2  Treat underlying causes as below   COPD with acute exacerbation  IV Solu-Medrol   bronchodilator therapy with duonebs q.i.d. and q.4 hours p.r.n.. Mucinex  O2 protocol  Hold home ICS d/t pneumonia   Severe Sepsis due to pneumonia  Sepsis manifested by tachycardia, tachypnea and fever as well as leukocytosis. Severe w/ organ dysfunction demonstrated by hypoxia  IV Rocephin  and Zithromax . follow blood cultures. IV lactated ringer. bronchodilator therapy with duonebs q.i.d. and q.4 hours p.r.n.. 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   Fungitell  ordered.   AFB -NAA Q8H consider outpatient langerhans histiocytosis lower down on differential  TB precautions and obtain a sputum for AFB. Pulmonary following    COVID-19 virus infection IV Solu-Medrol   bronchodilator therapy with duonebs q.i.d. and q.4 hours p.r.n.. 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 Imaging finding of uncertain clinical significance  Follow outpatient  Dilated main pulmonary artery consistent with pulmonary arterial hypertension. Follow outpatient   Essential hypertension holding home amlodipine   HLD Aortic atherosclerosis Continue home statin    Anxiety/Depression Continue home Celexa    DM2 A1C indicates good control Can dc insulin  Monitor Glc w/ am labs Hold home metformin  for now    No concerns based on BMI: Body mass index is 24.03 kg/m.SABRA Significantly low or high BMI is associated with higher medical risk.  Underweight - under 18  overweight - 25 to 29 obese - 30 or more Class 1 obesity: BMI of 30.0 to 34 Class 2 obesity: BMI of 35.0 to 39 Class 3 obesity: BMI of 40.0 to 49 Super Morbid Obesity: BMI 50-59 Super-super Morbid Obesity: BMI 60+ Healthy nutrition and physical activity advised as adjunct to other disease management and risk reduction treatments    DVT prophylaxis: lovenox  IV fluids: have dc continuous IV fluids  Nutrition: carb modified  Central lines / other devices: none  Code Status: DNR ACP documentation reviewed:  none on file in [facility]  TOC needs: TBD Medical barriers to dispo: hypoxia, pulm consult, cultures pending. Expected  medical readiness for discharge few days.              Subjective / Brief ROS:  Denies CP/SOB at rest but significant SOB w/ minimal exertion  Pain controlled.  Denies new weakness.   Tolerating diet.  Reports no concerns w/ urination/defecation.   Family Communication: family at bedside on rounds   Objective Findings:  Vitals:   07/15/24 0218 07/15/24 0323 07/15/24 0809 07/15/24 0944  BP:  92/66 (!) 143/67 (!) 142/63  Pulse:  76 81 79  Resp:  18 18 16   Temp:  98.5 F (36.9 C) 98.3 F (36.8 C) 98.7 F (37.1 C)  TempSrc:   Oral   SpO2: 97% 93% 99% 90%  Weight:      Height:        Intake/Output Summary (Last 24 hours) at 07/15/2024 1738 Last data filed at 07/15/2024 0600 Gross per 24 hour  Intake 590.06 ml  Output --  Net 590.06 ml   Filed Weights   07/13/24 1825  Weight: 63.5 kg    Examination:  Physical Exam Constitutional:      General: He is not in acute distress.    Appearance: He is not ill-appearing.  Cardiovascular:     Rate and Rhythm: Normal rate and regular rhythm.  Pulmonary:     Breath sounds: Wheezing and rhonchi present.  Musculoskeletal:     Right lower leg: No edema.     Left lower leg: No edema.  Skin:    General: Skin is warm and dry.  Neurological:     Mental Status: He is alert.          Scheduled Medications:   atorvastatin   40 mg Oral Daily   azithromycin   500 mg Oral QHS   cholecalciferol  2,000 Units Oral Daily   citalopram   40 mg Oral Daily   enoxaparin  (LOVENOX ) injection  40 mg Subcutaneous Q24H   guaiFENesin  600 mg Oral BID   losartan   50 mg Oral Daily   methylPREDNISolone  (SOLU-MEDROL ) injection  80 mg Intravenous Daily   umeclidinium-vilanterol  1 puff Inhalation Daily    Continuous Infusions:  cefTRIAXone  (ROCEPHIN )  IV 2 g (07/14/24 2206)    PRN Medications:  acetaminophen  **OR** acetaminophen , albuterol , chlorpheniramine-HYDROcodone, magnesium hydroxide, ondansetron  **OR** ondansetron  (ZOFRAN ) IV, traZODone  Antimicrobials from admission:  Anti-infectives (From admission, onward)    Start     Dose/Rate Route Frequency Ordered Stop   07/15/24 2200  azithromycin  (ZITHROMAX ) tablet 500  mg        500 mg Oral Daily at bedtime 07/15/24 0750 07/18/24 2159   07/14/24 2200  azithromycin  (ZITHROMAX ) 500 mg in sodium chloride  0.9 % 250 mL IVPB  Status:  Discontinued        500 mg 250 mL/hr over 60 Minutes Intravenous Every 24 hours 07/13/24 2215 07/15/24 0750   07/14/24 2000  cefTRIAXone  (ROCEPHIN ) 2 g in sodium chloride  0.9 % 100 mL IVPB        2 g 200 mL/hr over 30 Minutes Intravenous Every 24 hours 07/13/24 2215 07/18/24 1959   07/13/24 1830  cefTRIAXone  (ROCEPHIN ) 2 g in sodium chloride  0.9 % 100 mL IVPB        2 g 200 mL/hr over 30 Minutes Intravenous Once 07/13/24 1827 07/13/24 1956   07/13/24 1830  azithromycin  (ZITHROMAX ) 500 mg in sodium chloride  0.9 % 250 mL IVPB        500 mg 250 mL/hr over 60 Minutes Intravenous  Once 07/13/24 1827  07/13/24 2203           Data Reviewed:  I have personally reviewed the following...  CBC: Recent Labs  Lab 07/13/24 1843 07/14/24 0453  WBC 13.0* 13.0*  NEUTROABS 10.5*  --   HGB 14.1 13.2  HCT 43.9 41.9  MCV 94.6 96.1  PLT 215 188   Basic Metabolic Panel: Recent Labs  Lab 07/13/24 1843 07/14/24 0453  NA 135 134*  K 4.0 4.2  CL 92* 94*  CO2 31 31  GLUCOSE 124* 152*  BUN 12 15  CREATININE 0.91 0.82  CALCIUM  8.6* 8.1*   GFR: Estimated Creatinine Clearance: 75.2 mL/min (by C-G formula based on SCr of 0.82 mg/dL). Liver Function Tests: Recent Labs  Lab 07/13/24 1843  AST 39  ALT 30  ALKPHOS 76  BILITOT 0.7  PROT 7.5  ALBUMIN 3.5   No results for input(s): LIPASE, AMYLASE in the last 168 hours. No results for input(s): AMMONIA in the last 168 hours. Coagulation Profile: Recent Labs  Lab 07/13/24 1843 07/14/24 0453  INR 1.1 1.1   Cardiac Enzymes: No results for input(s): CKTOTAL, CKMB, CKMBINDEX, TROPONINI in the last 168 hours. BNP (last 3 results) No results for input(s): PROBNP in the last 8760 hours. HbA1C: Recent Labs    07/14/24 0453  HGBA1C 6.1*   CBG: Recent Labs   Lab 07/14/24 0731  GLUCAP 141*   Lipid Profile: No results for input(s): CHOL, HDL, LDLCALC, TRIG, CHOLHDL, LDLDIRECT in the last 72 hours. Thyroid Function Tests: No results for input(s): TSH, T4TOTAL, FREET4, T3FREE, THYROIDAB in the last 72 hours. Anemia Panel: No results for input(s): VITAMINB12, FOLATE, FERRITIN, TIBC, IRON, RETICCTPCT in the last 72 hours. Most Recent Urinalysis On File:     Component Value Date/Time   COLORURINE YELLOW (A) 07/14/2024 0109   APPEARANCEUR CLEAR (A) 07/14/2024 0109   LABSPEC >1.046 (H) 07/14/2024 0109   PHURINE 5.0 07/14/2024 0109   GLUCOSEU NEGATIVE 07/14/2024 0109   HGBUR NEGATIVE 07/14/2024 0109   BILIRUBINUR NEGATIVE 07/14/2024 0109   KETONESUR NEGATIVE 07/14/2024 0109   PROTEINUR 30 (A) 07/14/2024 0109   NITRITE NEGATIVE 07/14/2024 0109   LEUKOCYTESUR NEGATIVE 07/14/2024 0109   Sepsis Labs: @LABRCNTIP (procalcitonin:4,lacticidven:4) Microbiology: Recent Results (from the past 240 hours)  Resp panel by RT-PCR (RSV, Flu A&B, Covid) Anterior Nasal Swab     Status: Abnormal   Collection Time: 07/13/24  6:43 PM   Specimen: Anterior Nasal Swab  Result Value Ref Range Status   SARS Coronavirus 2 by RT PCR POSITIVE (A) NEGATIVE Final    Comment: (NOTE) SARS-CoV-2 target nucleic acids are DETECTED.  The SARS-CoV-2 RNA is generally detectable in upper respiratory specimens during the acute phase of infection. Positive results are indicative of the presence of the identified virus, but do not rule out bacterial infection or co-infection with other pathogens not detected by the test. Clinical correlation with patient history and other diagnostic information is necessary to determine patient infection status. The expected result is Negative.  Fact Sheet for Patients: bloggercourse.com  Fact Sheet for Healthcare Providers: seriousbroker.it  This test is  not yet approved or cleared by the [place]  FDA and  has been authorized for detection and/or diagnosis of SARS-CoV-2 by FDA under an Emergency Use Authorization (EUA).  This EUA will remain in effect (meaning this test can be used) for the duration of  the COVID-19 declaration under Section 564(b)(1) of the A ct, 21 U.S.C. section 360bbb-3(b)(1), unless the authorization is terminated or revoked sooner.  Influenza A by PCR NEGATIVE NEGATIVE Final   Influenza B by PCR NEGATIVE NEGATIVE Final    Comment: (NOTE) The Xpert Xpress SARS-CoV-2/FLU/RSV plus assay is intended as an aid in the diagnosis of influenza from Nasopharyngeal swab specimens and should not be used as a sole basis for treatment. Nasal washings and aspirates are unacceptable for Xpert Xpress SARS-CoV-2/FLU/RSV testing.  Fact Sheet for Patients: bloggercourse.com  Fact Sheet for Healthcare Providers: seriousbroker.it  This test is not yet approved or cleared by the [place]  FDA and has been authorized for detection and/or diagnosis of SARS-CoV-2 by FDA under an Emergency Use Authorization (EUA). This EUA will remain in effect (meaning this test can be used) for the duration of the COVID-19 declaration under Section 564(b)(1) of the Act, 21 U.S.C. section 360bbb-3(b)(1), unless the authorization is terminated or revoked.     Resp Syncytial Virus by PCR NEGATIVE NEGATIVE Final    Comment: (NOTE) Fact Sheet for Patients: bloggercourse.com  Fact Sheet for Healthcare Providers: seriousbroker.it  This test is not yet approved or cleared by the [place]  FDA and has been authorized for detection and/or diagnosis of SARS-CoV-2 by FDA under an Emergency Use Authorization (EUA). This EUA will remain in effect (meaning this test can be used) for the duration of the COVID-19 declaration under Section  564(b)(1) of the Act, 21 U.S.C. section 360bbb-3(b)(1), unless the authorization is terminated or revoked.  Performed at [facility], [address], [place], [place] [address]   Blood Culture (routine x 2)     Status: None (Preliminary result)   Collection Time: 07/13/24  6:52 PM   Specimen: BLOOD  Result Value Ref Range Status   Specimen Description BLOOD BLOOD LEFT ARM  Final   Special Requests   Final    BOTTLES DRAWN AEROBIC AND ANAEROBIC Blood Culture results may not be optimal due to an inadequate volume of blood received in culture bottles   Culture   Final    NO GROWTH 2 DAYS Performed at [facility], [address], [place], [place] [address]    Report Status PENDING  Incomplete  Blood Culture (routine x 2)     Status: None (Preliminary result)   Collection Time: 07/13/24  6:52 PM   Specimen: BLOOD  Result Value Ref Range Status   Specimen Description BLOOD BLOOD RIGHT ARM  Final   Special Requests   Final    BOTTLES DRAWN AEROBIC AND ANAEROBIC Blood Culture adequate volume   Culture   Final    NO GROWTH 2 DAYS Performed at [facility], [address], [place], [place] [address]    Report Status PENDING  Incomplete  Expectorated Sputum Assessment w Gram Stain, Rflx to Resp Cult     Status: None   Collection Time: 07/15/24 12:00 PM   Specimen: Sputum  Result Value Ref Range Status   Specimen Description SPUTUM  Final   Special Requests NONE  Final   Sputum evaluation   Final    THIS SPECIMEN IS ACCEPTABLE FOR SPUTUM CULTURE Performed at [facility], [address] [place], [place] [address]    Report Status 07/15/2024 FINAL  Final      Radiology Studies last 3 days: CT Angio Chest PE W and/or Wo Contrast Result Date: 07/13/2024 CLINICAL DATA:  Febrile, hypoxia, productive cough EXAM: CT ANGIOGRAPHY CHEST WITH CONTRAST TECHNIQUE: Multidetector CT imaging of the chest was performed using the standard protocol during  bolus administration of intravenous contrast. Multiplanar CT  image reconstructions and MIPs were obtained to evaluate the vascular anatomy. RADIATION DOSE REDUCTION: This exam was performed according to the departmental dose-optimization program which includes automated exposure control, adjustment of the mA and/or kV according to patient size and/or use of iterative reconstruction technique. CONTRAST:  75mL OMNIPAQUE  IOHEXOL  350 MG/ML SOLN COMPARISON:  07/13/2024, 05/23/2023 FINDINGS: Cardiovascular: This is a technically adequate evaluation of the pulmonary vasculature. No filling defects or pulmonary emboli. Dilated main pulmonary artery measuring 3.6 cm consistent with pulmonary arterial hypertension. The heart is unremarkable without pericardial effusion. No evidence of thoracic aortic aneurysm or dissection. There is atherosclerosis of the thoracic aorta. 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 Mediastinum/Nodes: Subcentimeter mediastinal and hilar lymph nodes are identified, measuring up to 9 mm in the precarinal region. Thyroid, trachea, and esophagus are unremarkable. Lungs/Pleura: Nodular airspace disease is seen diffusely throughout the bilateral lungs, with many of the small nodular areas of consolidation demonstrating central cavitation. Findings are consistent with atypical cavitating pneumonia, versus sequela of septic emboli. No effusion or pneumothorax. Central airways are patent. Upper Abdomen: No acute abnormality. Musculoskeletal: No acute or destructive bony abnormalities. Reconstructed images demonstrate no additional findings. Review of the MIP images confirms the above findings. IMPRESSION: 1. No evidence of acute pulmonary embolus. 2. Small nodular airspace opacities scattered throughout the lungs bilaterally,  demonstrating areas of central cavitation. Findings are consistent with atypical cavitating pneumonia versus sequela of septic emboli. 3. Subcentimeter mediastinal and hilar lymph nodes consistent with reactive adenopathy. No pathologically enlarged lymph nodes are identified. 4. 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 5. Dilated main pulmonary artery consistent with pulmonary arterial hypertension. 6.  Aortic Atherosclerosis (ICD10-I70.0). Electronically Signed   By: Ozell Daring M.D.   On: 07/13/2024 20:09   DG Chest Port 1 View Result Date: 07/13/2024 EXAM: 1 VIEW XRAY OF THE CHEST 07/13/2024 06:45:16 PM COMPARISON: Prior study dated 05/16/2024. CLINICAL HISTORY: Questionable sepsis - evaluate for abnormality. FINDINGS: LUNGS AND PLEURA: Stable probable scarring seen in the right lung base. No pulmonary edema. No pleural effusion. No pneumothorax. HEART AND MEDIASTINUM: No acute abnormality of the cardiac and mediastinal silhouettes. BONES AND SOFT TISSUES: No acute osseous abnormality. IMPRESSION: 1. No acute cardiopulmonary abnormality. 2. Stable scarring in the right lung base. Electronically signed by: Lynwood Seip MD 07/13/2024 06:56 PM EDT RP Workstation: [id number]         Laneta Blunt, DO [facility] 07/15/2024, 5:38 PM    Dictation software may have been used to generate the above note. Typos may occur and escape review in typed/dictated notes. Please contact Dr Blunt directly for clarity if needed.  Staff may message me via secure chat in Epic  but this may not receive an immediate response,  please page me for urgent matters!  If 7PM-7AM, please contact night coverage [email or web address]

## 2024-07-15 NOTE — Care Plan (Signed)
 This RN has advised patient to not pull his tele monitor off of him multiple time. Patient claims that the box does not work, CCMD called to confirm they can see him in the monitor.

## 2024-07-16 ENCOUNTER — Inpatient Hospital Stay

## 2024-07-16 DIAGNOSIS — J9601 Acute respiratory failure with hypoxia: Secondary | ICD-10-CM | POA: Diagnosis not present

## 2024-07-16 DIAGNOSIS — J811 Chronic pulmonary edema: Secondary | ICD-10-CM | POA: Diagnosis not present

## 2024-07-16 LAB — MAGNESIUM: Magnesium: 2.2 mg/dL (ref 1.7–2.4)

## 2024-07-16 LAB — POTASSIUM: Potassium: 4.8 mmol/L (ref 3.5–5.1)

## 2024-07-16 LAB — MRSA NEXT GEN BY PCR, NASAL: MRSA by PCR Next Gen: NOT DETECTED

## 2024-07-16 MED ORDER — GUAIFENESIN ER 600 MG PO TB12: 600.0000 mg | ORAL_TABLET | Freq: Two times a day (BID) | ORAL | 0 refills | Status: DC

## 2024-07-16 MED ORDER — AZITHROMYCIN 250 MG PO TABS: 250.0000 mg | ORAL_TABLET | Freq: Every day | ORAL | 0 refills | Status: AC

## 2024-07-16 MED ORDER — PREDNISONE 20 MG PO TABS: 40.0000 mg | ORAL_TABLET | Freq: Every day | ORAL | 0 refills | Status: AC

## 2024-07-16 MED ORDER — AMOXICILLIN-POT CLAVULANATE 875-125 MG PO TABS: 1.0000 | ORAL_TABLET | Freq: Two times a day (BID) | ORAL | 0 refills | Status: AC

## 2024-07-16 NOTE — Progress Notes (Signed)
 PULMONOLOGY         Date: 07/16/2024,   MRN# [id number] Roy Richards. 1961/03/25     AdmissionWeight: 63.5 kg                 CurrentWeight: 63.5 kg  Referring provider: Dr Lawence    CHIEF COMPLAINT:   Cavitary lung disease    HISTORY OF PRESENT ILLNESS   This is a 63 year old male with a history of colitis, anxiety disorder asthma, attention deficit hyperactivity disorder, bipolar disorder cocaine dependence, depression, dyslipidemia chronic emphysema hypertension nocturnal hypoxemia alcohol dependence, came in via ER with complaints of fevers, dyspnea, cough was found to have hypoxemia requiring 5 L of oxygen via nasal cannula, mild lactic acidosis leukocytosis.  COVID-19 panel was positive on arrival.  CT chest was performed showing cavitary lung disease with mediastinal and hilar adenopathy.  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  Patient was empirically started on Rocephin  and Zithromax  for community-acquired pneumonia, provided IV fluid resuscitation via LR.  PCCM consultation for further evaluation management of cavitary lung disease.  07/16/24- patient stable with no acute events. He had negative workup besides COVID and does have an atypical micronodular pattern of lung disease.  Thus far his blood cultures are negative. AFB is negative and resp cultures are too young to read but currently show scant yeast and rare GPCs.  He feels ok and is in no distress.  This may be something that can be worked up on outpatient basis including bronchoscopy to get invasive BAL specimen.    PAST MEDICAL HISTORY   Past Medical History:  Diagnosis Date   Acute colitis 06/26/2019   Anxiety 2021   Building collapse   Asthma    Attention deficit  hyperactivity disorder, combined type 09/12/2019   Bipolar II disorder (HCC) 09/12/2019   Cocaine dependence in remission (HCC) 09/12/2019   Contusion of abdominal wall 06/13/2023   Depression    Diabetes mellitus without complication (HCC)    Diverticular disease of colon 06/17/2023   Emphysema of lung (HCC) 2015   History of colon polyps 06/17/2023   Hyperlipidemia    Hypertension    Hypoxia 05/07/2020   Uncomplicated alcohol dependence (HCC) 09/12/2019     SURGICAL HISTORY   Past Surgical History:  Procedure Laterality Date   BIOPSY  08/09/2023   Procedure: BIOPSY;  Surgeon: Therisa Bi, MD;  Location: [facility] ENDOSCOPY;  Service: Gastroenterology;;   COLONOSCOPY WITH PROPOFOL  N/A 08/09/2023   Procedure: COLONOSCOPY WITH PROPOFOL ;  Surgeon: Therisa Bi, MD;  Location: [facility] ENDOSCOPY;  Service: Gastroenterology;  Laterality: N/A;   HERNIA REPAIR     POLYPECTOMY  08/09/2023   Procedure: POLYPECTOMY;  Surgeon: Therisa Bi, MD;  Location: [facility] ENDOSCOPY;  Service: Gastroenterology;;     FAMILY HISTORY   Family History  Problem Relation Age of Onset   COPD Mother    Heart attack Father 58   Heart disease Father    Hyperlipidemia Father    Hypertension Father      SOCIAL HISTORY   Social History   Tobacco Use   Smoking status: Former    Current packs/day: 0.50    Average packs/day: 0.5 packs/day for 26.1 years (13.0 ttl pk-yrs)    Types: Cigarettes    Start date: 12/15/2022  Smokeless tobacco: Never  Vaping Use   Vaping status: Never Used  Substance Use Topics   Alcohol use: Yes    Comment: occasionally   Drug use: Never     MEDICATIONS    Home Medication:    Current Medication:  Current Facility-Administered Medications:    acetaminophen  (TYLENOL ) tablet 650 mg, 650 mg, Oral, Q6H PRN **OR** acetaminophen  (TYLENOL ) suppository 650 mg, 650 mg, Rectal, Q6H PRN, Mansy, Jan A, MD   albuterol  (PROVENTIL ) (2.5 MG/3ML) 0.083% nebulizer solution 3 mL, 3 mL,  Inhalation, Q4H PRN, Alexander, Natalie, DO   atorvastatin  (LIPITOR) tablet 40 mg, 40 mg, Oral, Daily, Mansy, Jan A, MD, 40 mg at 07/15/24 0808   azithromycin  (ZITHROMAX ) tablet 500 mg, 500 mg, Oral, QHS, Chappell, Alex B, RPH, 500 mg at 07/15/24 2147   cefTRIAXone  (ROCEPHIN ) 2 g in sodium chloride  0.9 % 100 mL IVPB, 2 g, Intravenous, Q24H, Mansy, Jan A, MD, Last Rate: 200 mL/hr at 07/15/24 2200, 2 g at 07/15/24 2200   chlorpheniramine-HYDROcodone (TUSSIONEX) 10-8 MG/5ML suspension 5 mL, 5 mL, Oral, Q12H PRN, Mansy, Jan A, MD, 5 mL at 07/15/24 1308   cholecalciferol (VITAMIN D3) tablet 2,000 Units, 2,000 Units, Oral, Daily, Mansy, Jan A, MD, 2,000 Units at 07/15/24 9189   citalopram  (CELEXA ) tablet 40 mg, 40 mg, Oral, Daily, Mansy, Jan A, MD, 40 mg at 07/15/24 0808   enoxaparin  (LOVENOX ) injection 40 mg, 40 mg, Subcutaneous, Q24H, Mansy, Jan A, MD, 40 mg at 07/15/24 9191   guaiFENesin (MUCINEX) 12 hr tablet 600 mg, 600 mg, Oral, BID, Mansy, Jan A, MD, 600 mg at 07/15/24 2147   losartan  (COZAAR ) tablet 50 mg, 50 mg, Oral, Daily, Mansy, Jan A, MD, 50 mg at 07/15/24 0808   magnesium hydroxide (MILK OF MAGNESIA) suspension 30 mL, 30 mL, Oral, Daily PRN, Mansy, Jan A, MD   methylPREDNISolone  sodium succinate (SOLU-MEDROL ) 125 mg/2 mL injection 80 mg, 80 mg, Intravenous, Daily, Mansy, Jan A, MD, 80 mg at 07/15/24 9191   ondansetron  (ZOFRAN ) tablet 4 mg, 4 mg, Oral, Q6H PRN **OR** ondansetron  (ZOFRAN ) injection 4 mg, 4 mg, Intravenous, Q6H PRN, Mansy, Jan A, MD   traZODone (DESYREL) tablet 25 mg, 25 mg, Oral, QHS PRN, Mansy, Jan A, MD   umeclidinium-vilanterol (ANORO ELLIPTA) 62.5-25 MCG/ACT 1 puff, 1 puff, Inhalation, Daily, Marsa Edelman, DO, 1 puff at 07/15/24 0810    ALLERGIES   Patient has no known allergies.     REVIEW OF SYSTEMS    Review of Systems:  Gen:  Denies  fever, sweats, chills weigh loss  HEENT: Denies blurred vision, double vision, ear pain, eye pain, hearing loss, nose  bleeds, sore throat Cardiac:  No dizziness, chest pain or heaviness, chest tightness,edema Resp:   reports dyspnea chronically  Gi: Denies swallowing difficulty, stomach pain, nausea or vomiting, diarrhea, constipation, bowel incontinence Gu:  Denies bladder incontinence, burning urine Ext:   Denies Joint pain, stiffness or swelling Skin: Denies  skin rash, easy bruising or bleeding or hives Endoc:  Denies polyuria, polydipsia , polyphagia or weight change Psych:   Denies depression, insomnia or hallucinations   Other:  All other systems negative   VS: BP (!) 153/73 (BP Location: Left Arm)   Pulse 69   Temp 98.5 F (36.9 C) (Oral)   Resp 18   Ht 5' 4 (1.626 m)   Wt 63.5 kg   SpO2 94%   BMI 24.03 kg/m      PHYSICAL EXAM    GENERAL:NAD, no  fevers, chills, no weakness no fatigue HEAD: Normocephalic, atraumatic.  EYES: Pupils equal, round, reactive to light. Extraocular muscles intact. No scleral icterus.  MOUTH: Moist mucosal membrane. Dentition intact. No abscess noted.  EAR, NOSE, THROAT: Clear without exudates. No external lesions.  NECK: Supple. No thyromegaly. No nodules. No JVD.  PULMONARY: decreased breath sounds with mild rhonchi worse at bases bilaterally.  CARDIOVASCULAR: S1 and S2. Regular rate and rhythm. No murmurs, rubs, or gallops. No edema. Pedal pulses 2+ bilaterally.  GASTROINTESTINAL: Soft, nontender, nondistended. No masses. Positive bowel sounds. No hepatosplenomegaly.  MUSCULOSKELETAL: No swelling, clubbing, or edema. Range of motion full in all extremities.  NEUROLOGIC: Cranial nerves II through XII are intact. No gross focal neurological deficits. Sensation intact. Reflexes intact.  SKIN: No ulceration, lesions, rashes, or cyanosis. Skin warm and dry. Turgor intact.  PSYCHIATRIC: Mood, affect within normal limits. The patient is awake, alert and oriented x 3. Insight, judgment intact.       IMAGING    rrative & Impression  CLINICAL DATA:   Febrile, hypoxia, productive cough   EXAM: CT ANGIOGRAPHY CHEST WITH CONTRAST   TECHNIQUE: Multidetector CT imaging of the chest was performed using the standard protocol during bolus administration of intravenous contrast. Multiplanar CT image reconstructions and MIPs were obtained to evaluate the vascular anatomy.   RADIATION DOSE REDUCTION: This exam was performed according to the departmental dose-optimization program which includes automated exposure control, adjustment of the mA and/or kV according to patient size and/or use of iterative reconstruction technique.   CONTRAST:  75mL OMNIPAQUE  IOHEXOL  350 MG/ML SOLN   COMPARISON:  07/13/2024, 05/23/2023   FINDINGS: Cardiovascular: This is a technically adequate evaluation of the pulmonary vasculature. No filling defects or pulmonary emboli. Dilated main pulmonary artery measuring 3.6 cm consistent with pulmonary arterial hypertension.   The heart is unremarkable without pericardial effusion. No evidence of thoracic aortic aneurysm or dissection. There is atherosclerosis of the thoracic aorta. 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   Mediastinum/Nodes: Subcentimeter mediastinal and hilar lymph nodes are identified, measuring up to 9 mm in the precarinal region. Thyroid, trachea, and esophagus are unremarkable.   Lungs/Pleura: Nodular airspace disease is seen diffusely throughout the bilateral lungs, with many of the small nodular areas of consolidation demonstrating central cavitation. Findings are consistent with atypical cavitating pneumonia, versus sequela of septic emboli. No effusion or pneumothorax. Central airways are patent.   Upper Abdomen: No acute abnormality.   Musculoskeletal: No acute or destructive bony  abnormalities. Reconstructed images demonstrate no additional findings.   Review of the MIP images confirms the above findings.   IMPRESSION: 1. No evidence of acute pulmonary embolus. 2. Small nodular airspace opacities scattered throughout the lungs bilaterally, demonstrating areas of central cavitation. Findings are consistent with atypical cavitating pneumonia versus sequela of septic emboli. 3. Subcentimeter mediastinal and hilar lymph nodes consistent with reactive adenopathy. No pathologically enlarged lymph nodes are identified. 4. 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 5. Dilated main pulmonary artery consistent with pulmonary arterial hypertension. 6.  Aortic Atherosclerosis (ICD10-I70.0).     Electronically Signed   By: Ozell Daring M.D.   On: 07/13/2024 20:09   ASSESSMENT/PLAN   Cavitary lung disease        -  patient currently with COVID19        - AFB sputum smears have been ordered and are negative thus far        - leukocytosis is improved         - blood cultures negative to date        - miliary pattern with cavitary micronodules, possible septic embolic disease vs fungal infection.  MRSA pcr in process.  Fungitell ordered.         - Leukocytosis is improved and he is currently on steroids       -AFB -NAA Q8H        -hx of cocaine smoking         - consider outpatient langerhans histiocytosis lower down on differential          Cocaine abuse      - this may also cause cavitary lung process with overlying emphysematous changes from tobacco abuse    Alcoholism history     -no withdrawal symptoms thus far        Thank you for allowing me to participate in the care of this patient.   Patient/Family are satisfied with care plan and all questions have been answered.    Provider disclosure: Patient  with at least one acute or chronic illness or injury that poses a threat to life or bodily function and is being managed actively during this encounter.  All of the below services have been performed independently by signing provider:  review of prior documentation from internal and or external health records.  Review of previous and current lab results.  Interview and comprehensive assessment during patient visit today. Review of current and previous chest radiographs/CT scans. Discussion of management and test interpretation with health care team and patient/family.   This document was prepared using Dragon voice recognition software and may include unintentional dictation errors.     Marrietta Thunder, M.D.  Division of Pulmonary & Critical Care Medicine

## 2024-07-16 NOTE — TOC Initial Note (Addendum)
 Transition of Care (TOC) - Initial/Assessment Note    Patient Details  Name: Roy Richards. MRN: [id number] Date of Birth: 03-May-1961  Transition of Care [facility]) CM/SW Contact:    Corean ONEIDA Haddock, RN Phone Number: 07/16/2024, 2:17 PM  Clinical Narrative:                    TOC was consulted for Home oxygen Bedside RN confirmed that patient has inogen home O2, and portable concentrator that meets patient liter flow need.  Per Bedside RN patient's significant other brought portable o2 for transport  Per SDOH utility and social isolation resources added to AVS     Patient Goals and CMS Choice            Expected Discharge Plan and Services         Expected Discharge Date: 07/16/24                                    Prior Living Arrangements/Services                       Activities of Daily Living      Permission Sought/Granted                  Emotional Assessment              Admission diagnosis:  Bronchospasm [J98.01] Cavitary lesion of lung [J98.4] Acute respiratory failure with hypoxia (HCC) [J96.01] Pneumonia due to infectious organism, unspecified laterality, unspecified part of lung [J18.9] COVID [U07.1] Patient Active Problem List   Diagnosis Date Noted   COPD with acute exacerbation (HCC) 07/14/2024   Sepsis due to pneumonia (HCC) 07/14/2024   COVID-19 virus infection 07/14/2024   COVID 07/14/2024   Acute respiratory failure with hypoxia (HCC) 07/13/2024   Acute hypoxic respiratory failure (HCC) 05/14/2024   Leukocytosis 05/14/2024   Adenomatous polyp of colon 08/09/2023   Coronary artery disease involving native coronary artery of native heart without angina pectoris 05/31/2023   Depressive disorder 05/20/2023   Erectile dysfunction 11/18/2021   Preventative health care 10/10/2020   Cerumen impaction 10/10/2020   Hyperlipidemia 10/08/2019   Post-traumatic stress disorder, unspecified 09/12/2019   Left subclavian  artery occlusion 07/22/2019   Diabetes mellitus (HCC) 07/22/2019   Aortic atherosclerosis 06/26/2019   Essential hypertension 06/26/2019   COPD (chronic obstructive pulmonary disease) (HCC) 06/26/2019   PCP:  [name], NP Pharmacy:   [facility] [id number] [address], [place] - [address] [place] [address] Phone: [phone] Fax: [phone]     Social Drivers of Health (SDOH) Social History: SDOH Screenings   Food Insecurity: No Food Insecurity (07/15/2024)  Housing: Low Risk  (07/15/2024)  Transportation Needs: No Transportation Needs (07/15/2024)  Utilities: At Risk (07/15/2024)  Alcohol Screen: Low Risk  (06/20/2024)  Depression (PHQ2-9): Low Risk  (06/21/2024)  Financial Resource Strain: Low Risk  (06/20/2024)  Physical Activity: Sufficiently Active (06/20/2024)  Social Connections: Socially Isolated (07/15/2024)  Stress: Stress Concern Present (06/20/2024)  Tobacco Use: Medium Risk (07/13/2024)  Health Literacy: Adequate Health Literacy (02/02/2024)   SDOH Interventions:     Readmission Risk Interventions     No data to display

## 2024-07-16 NOTE — Progress Notes (Signed)
 O2 sat at rest on room air: 84%   O2 sat at exertion on _3__ L/min of O2: _94__%.

## 2024-07-16 NOTE — Discharge Summary (Signed)
 Physician Discharge Summary   Patient: Roy Richards. MRN: [id number]  DOB: 21-Mar-1961   Admit:     Date of Admission: 07/13/2024 Admitted from: home   Discharge: Date of discharge: 07/16/24 Disposition: Home Condition at discharge: stable / fair  CODE STATUS: FULL CODE     Discharge Physician: Laneta Blunt, DO [facility]     PCP: [name], NP  Recommendations for Outpatient Follow-up:  Follow up with PCP [name], NP in 1-2 weeks Follow as directed w/ pulmonology    Discharge Instructions     Diet - low sodium heart healthy   Complete by: As directed    Diet Carb Modified   Complete by: As directed    Increase activity slowly   Complete by: As directed          Discharge Diagnoses: Principal Problem:   Acute respiratory failure with hypoxia (HCC) Active Problems:   COPD with acute exacerbation (HCC)   Sepsis due to pneumonia (HCC)   COVID-19 virus infection   Essential hypertension   COVID       Hospital course / significant events:   Roy Richards. is a 63 y.o. male with medical history significant for anxiety, depression, type 2 diabetes mellitus, diverticulosis, emphysema, alcohol abuse, asthma, bipolar 2 disorder, hypertension tension and dyslipidemia, who presented to the emergency room with worsening dyspnea with associated cough and wheezing over the last couple weeks.    10/31: admitted to hospitalist for COVID, resp fail, cavitary lung lesion vs sequelae septic emboli. On rocephin , azithromycin .  11/01: remains on O2, stable, continue abx 11/02: per pulmonary, await AFB, MRSA, Fungitell. Remains on 3L [place] and significant SOB w/ minimal exertion 11/03: desat but pt feeling well and eager for discharge       Consultants:  Pulmonology  Procedures/Surgeries: none      ASSESSMENT & PLAN:   Acute respiratory failure with hypoxia  D/t COPD exacerbation + pneumonia w/ cavitary lung lesions +  COVID Supplemental O2 - dc home on 24/7 O2 Treat underlying causes as below   COPD with acute exacerbation  IV Solu-Medrol  --> prednisone   Resume home inhalers Mucinex   Severe Sepsis due to pneumonia  Sepsis manifested by tachycardia, tachypnea and fever as well as leukocytosis. 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   Fungitell ordered.   AFB -NAA Q8H consider outpatient langerhans histiocytosis lower down on differential  TB precautions and obtain a sputum for AFB. 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 Imaging finding of uncertain clinical significance  Follow outpatient  Dilated main pulmonary artery consistent with pulmonary arterial hypertension. Follow outpatient   Essential hypertension holding home amlodipine   HLD Aortic atherosclerosis Continue home statin    Anxiety/Depression Continue home Celexa    DM2 A1C indicates good control Can dc insulin  Monitor Glc w/ am labs Hold home metformin  for now  No concerns based on BMI: Body mass index is 24.03 kg/m.SABRA Significantly low or high BMI is associated with higher medical risk.  Underweight - under 18  overweight - 25 to 29 obese - 30 or more Class 1 obesity: BMI of 30.0 to 34 Class 2 obesity: BMI of 35.0 to 39 Class 3 obesity: BMI of 40.0 to 49 Super Morbid  Obesity: BMI 50-59 Super-super Morbid Obesity: BMI 60+ Healthy nutrition and physical activity advised as adjunct to other disease management and risk reduction treatments             Discharge Instructions  Allergies as of 07/16/2024   No Known Allergies      Medication List     STOP taking these medications    amLODipine  10 MG tablet Commonly known as: NORVASC        TAKE these medications    albuterol  108 (90 Base) MCG/ACT inhaler Commonly known as: VENTOLIN  HFA Inhale 1-2 puffs into the lungs every 6 (six) hours as needed for wheezing or shortness of breath.   albuterol  (2.5 MG/3ML) 0.083% nebulizer solution Commonly known as: PROVENTIL  Take 3 mLs (2.5 mg total) by nebulization every 6 (six) hours as needed for wheezing or shortness of breath.   amoxicillin -clavulanate 875-125 MG tablet Commonly known as: AUGMENTIN  Take 1 tablet by mouth 2 (two) times daily for 3 days.   atorvastatin  40 MG tablet Commonly known as: LIPITOR TAKE 1 TABLET BY MOUTH DAILY FOR CHOLESTEROL   azithromycin  250 MG tablet Commonly known as: ZITHROMAX  Take 1 tablet (250 mg total) by mouth daily for 3 days.   citalopram  40 MG tablet Commonly known as: CELEXA  TAKE 1 TABLET BY MOUTH DAILY FOR ANXIETY   fluticasone -salmeterol 250-50 MCG/ACT Aepb Commonly known as: ADVAIR INHALE 1 PUFF BY MOUTH EVERY MORNING AND INHALE 1 PUFF BY MOUTH EVERY EVENING   guaiFENesin 600 MG 12 hr tablet Commonly known as: MUCINEX Take 1-2 tablets (600-1,200 mg total) by mouth 2 (two) times daily.   losartan  50 MG tablet Commonly known as: COZAAR  TAKE 1 TABLET BY MOUTH DAILY FOR BLOOD PRESSURE   metFORMIN  500 MG 24 hr tablet Commonly known as: GLUCOPHAGE -XR Take 1 tablet (500 mg total) by mouth daily with breakfast. for diabetes.   MULTIVITAMIN ADULT PO Take by mouth.   predniSONE  20 MG tablet Commonly known as: DELTASONE  Take 2 tablets (40 mg total) by mouth daily with breakfast for 2  days. For 5 days Start taking on: July 17, 2024   sildenafil  100 MG tablet Commonly known as: VIAGRA  Take 1/2 to 1 tablet by mouth 30 min prior to sexual activity   Vitamin D 50 MCG (2000 UT) tablet Take 2,000 Units by mouth daily.               Durable Medical Equipment  (From admission, onward)           Start     Ordered   07/16/24 0945  For home use only DME oxygen  Once       Question Answer Comment  Length of Need 6 Months   Mode or (Route) Nasal cannula   Liters per Minute 3   Frequency Continuous (stationary and portable oxygen unit needed)   Oxygen delivery system: Gas   Oxygen delivery system: Portable concentrator (POC)      07/16/24 0946             Follow-up Information     Parris Manna, MD. Call.   Specialty: Pulmonary  Disease Why: confirm hospital follow up. Please call the office to make your appointment. Contact information: [address] [place] [address] [phone]                 No Known Allergies   Subjective: pt reports SOB on exertion but better w/ O2, he nomally uses O2 as needed but agreeable to use 24/7 for the time being. Denies CP   Discharge Exam: BP (!) 153/73 (BP Location: Left Arm)   Pulse 69   Temp 98.5 F (36.9 C) (Oral)   Resp 18   Ht 5' 4 (1.626 m)   Wt 63.5 kg   SpO2 94%   BMI 24.03 kg/m  General: Pt is alert, awake, not in acute distress Cardiovascular: RRR, S1/S2 +, no rubs, no gallops Respiratory: Coarse breath sounds bilaterally all field no rales Abdominal: Soft, NT, ND, bowel sounds + Extremities: no edema, no cyanosis     The results of significant diagnostics from this hospitalization (including imaging, microbiology, ancillary and laboratory) are listed below for reference.     Microbiology: Recent Results (from the past 240 hours)  Resp panel by RT-PCR (RSV, Flu A&B, Covid) Anterior Nasal Swab     Status: Abnormal   Collection Time: 07/13/24  6:43 PM    Specimen: Anterior Nasal Swab  Result Value Ref Range Status   SARS Coronavirus 2 by RT PCR POSITIVE (A) NEGATIVE Final    Comment: (NOTE) SARS-CoV-2 target nucleic acids are DETECTED.  The SARS-CoV-2 RNA is generally detectable in upper respiratory specimens during the acute phase of infection. Positive results are indicative of the presence of the identified virus, but do not rule out bacterial infection or co-infection with other pathogens not detected by the test. Clinical correlation with patient history and other diagnostic information is necessary to determine patient infection status. The expected result is Negative.  Fact Sheet for Patients: bloggercourse.com  Fact Sheet for Healthcare Providers: seriousbroker.it  This test is not yet approved or cleared by the [place]  FDA and  has been authorized for detection and/or diagnosis of SARS-CoV-2 by FDA under an Emergency Use Authorization (EUA).  This EUA will remain in effect (meaning this test can be used) for the duration of  the COVID-19 declaration under Section 564(b)(1) of the A ct, 21 U.S.C. section 360bbb-3(b)(1), unless the authorization is terminated or revoked sooner.     Influenza A by PCR NEGATIVE NEGATIVE Final   Influenza B by PCR NEGATIVE NEGATIVE Final    Comment: (NOTE) The Xpert Xpress SARS-CoV-2/FLU/RSV plus assay is intended as an aid in the diagnosis of influenza from Nasopharyngeal swab specimens and should not be used as a sole basis for treatment. Nasal washings and aspirates are unacceptable for Xpert Xpress SARS-CoV-2/FLU/RSV testing.  Fact Sheet for Patients: bloggercourse.com  Fact Sheet for Healthcare Providers: seriousbroker.it  This test is not yet approved or cleared by the [place]  FDA and has been authorized for detection and/or diagnosis of SARS-CoV-2 by FDA under an  Emergency Use Authorization (EUA). This EUA will remain in effect (meaning this test can be used) for the duration of the COVID-19 declaration under Section 564(b)(1) of the Act, 21 U.S.C. section 360bbb-3(b)(1), unless the authorization is terminated or revoked.     Resp Syncytial Virus by PCR NEGATIVE NEGATIVE Final    Comment: (NOTE) Fact Sheet for Patients: bloggercourse.com  Fact Sheet for Healthcare Providers: seriousbroker.it  This test is not yet approved or cleared by the [place]  FDA  and has been authorized for detection and/or diagnosis of SARS-CoV-2 by FDA under an Emergency Use Authorization (EUA). This EUA will remain in effect (meaning this test can be used) for the duration of the COVID-19 declaration under Section 564(b)(1) of the Act, 21 U.S.C. section 360bbb-3(b)(1), unless the authorization is terminated or revoked.  Performed at [facility], [address] [place], [place] [address]   Blood Culture (routine x 2)     Status: None (Preliminary result)   Collection Time: 07/13/24  6:52 PM   Specimen: BLOOD  Result Value Ref Range Status   Specimen Description BLOOD BLOOD LEFT ARM  Final   Special Requests   Final    BOTTLES DRAWN AEROBIC AND ANAEROBIC Blood Culture results may not be optimal due to an inadequate volume of blood received in culture bottles   Culture   Final    NO GROWTH 3 DAYS Performed at [facility], [address] [place], [place] [address]    Report Status PENDING  Incomplete  Blood Culture (routine x 2)     Status: None (Preliminary result)   Collection Time: 07/13/24  6:52 PM   Specimen: BLOOD  Result Value Ref Range Status   Specimen Description BLOOD BLOOD RIGHT ARM  Final   Special Requests   Final    BOTTLES DRAWN AEROBIC AND ANAEROBIC Blood Culture adequate volume   Culture   Final    NO GROWTH 3 DAYS Performed at [facility], [address] [place], [place] [address]    Report Status PENDING  Incomplete  Acid Fast Smear (AFB)     Status: None   Collection Time: 07/14/24  4:41 AM   Specimen: Sputum  Result Value Ref Range Status   AFB Specimen Processing Concentration  Final   Acid Fast Smear Negative  Final    Comment: (NOTE) Performed At: [facility]  [address] [place], [place] [id number] Jennette Shorter MD Ey:[id number]    Source (AFB) EXPECTORATED SPUTUM  Final    Comment: Performed at [facility], [address] [place], [place] [address]  Expectorated Sputum Assessment w Gram Stain, Rflx to Resp Cult     Status: None   Collection Time: 07/15/24 12:00 PM   Specimen: Sputum  Result Value Ref Range Status   Specimen Description SPUTUM  Final   Special Requests NONE  Final   Sputum evaluation   Final    THIS SPECIMEN IS ACCEPTABLE FOR SPUTUM CULTURE Performed at [facility], [address], [place], [place] [address]    Report Status 07/15/2024 FINAL  Final  Culture, Respiratory w Gram Stain     Status: None (Preliminary result)   Collection Time: 07/15/24 12:00 PM   Specimen: SPU  Result Value Ref Range Status   Specimen Description   Final    SPUTUM Performed at [facility], [address], [place], [place] [address]    Special Requests   Final    NONE Reflexed from [id number] Performed at [facility], [address] [place], [place] [address]    Gram Stain   Final    FEW WBC PRESENT, PREDOMINANTLY PMN RARE GRAM POSITIVE COCCI RARE BUDDING YEAST SEEN    Culture   Final    TOO YOUNG TO READ Performed at [facility], [address] [place], [place] [address]    Report Status PENDING  Incomplete  MRSA Next Gen by PCR, Nasal     Status: None  Collection Time: 07/16/24  8:00 AM   Specimen: Nasal Mucosa; Nasal Swab  Result Value Ref Range Status   MRSA by PCR Next Gen NOT DETECTED NOT DETECTED Final    Comment: (NOTE) The GeneXpert  MRSA Assay (FDA approved for NASAL specimens only), is one component of a comprehensive MRSA colonization surveillance program. It is not intended to diagnose MRSA infection nor to guide or monitor treatment for MRSA infections. Test performance is not FDA approved in patients less than 61 years old. Performed at [facility], [address] [place], [place] [address]      Labs: BNP (last 3 results) Recent Labs    05/14/24 0918 07/13/24 1843  BNP 93.9 93.6   Basic Metabolic Panel: Recent Labs  Lab 07/13/24 1843 07/14/24 0453 07/16/24 0225  NA 135 134*  --   K 4.0 4.2 4.8  CL 92* 94*  --   CO2 31 31  --   GLUCOSE 124* 152*  --   BUN 12 15  --   CREATININE 0.91 0.82  --   CALCIUM  8.6* 8.1*  --   MG  --   --  2.2   Liver Function Tests: Recent Labs  Lab 07/13/24 1843  AST 39  ALT 30  ALKPHOS 76  BILITOT 0.7  PROT 7.5  ALBUMIN 3.5   No results for input(s): LIPASE, AMYLASE in the last 168 hours. No results for input(s): AMMONIA in the last 168 hours. CBC: Recent Labs  Lab 07/13/24 1843 07/14/24 0453  WBC 13.0* 13.0*  NEUTROABS 10.5*  --   HGB 14.1 13.2  HCT 43.9 41.9  MCV 94.6 96.1  PLT 215 188   Cardiac Enzymes: No results for input(s): CKTOTAL, CKMB, CKMBINDEX, TROPONINI in the last 168 hours. BNP: Invalid input(s): POCBNP CBG: Recent Labs  Lab 07/14/24 0731  GLUCAP 141*   D-Dimer No results for input(s): DDIMER in the last 72 hours. Hgb A1c Recent Labs    07/14/24 0453  HGBA1C 6.1*   Lipid Profile No results for input(s): CHOL, HDL, LDLCALC, TRIG, CHOLHDL, LDLDIRECT in the last 72 hours. Thyroid function studies No results for input(s): TSH, T4TOTAL, T3FREE, THYROIDAB in the last 72 hours.  Invalid input(s): FREET3 Anemia work up No results for input(s): VITAMINB12, FOLATE, FERRITIN, TIBC, IRON, RETICCTPCT in the last 72 hours. Urinalysis    Component Value Date/Time    COLORURINE YELLOW (A) 07/14/2024 0109   APPEARANCEUR CLEAR (A) 07/14/2024 0109   LABSPEC >1.046 (H) 07/14/2024 0109   PHURINE 5.0 07/14/2024 0109   GLUCOSEU NEGATIVE 07/14/2024 0109   HGBUR NEGATIVE 07/14/2024 0109   BILIRUBINUR NEGATIVE 07/14/2024 0109   KETONESUR NEGATIVE 07/14/2024 0109   PROTEINUR 30 (A) 07/14/2024 0109   NITRITE NEGATIVE 07/14/2024 0109   LEUKOCYTESUR NEGATIVE 07/14/2024 0109   Sepsis Labs Recent Labs  Lab 07/13/24 1843 07/14/24 0453  WBC 13.0* 13.0*   Microbiology Recent Results (from the past 240 hours)  Resp panel by RT-PCR (RSV, Flu A&B, Covid) Anterior Nasal Swab     Status: Abnormal   Collection Time: 07/13/24  6:43 PM   Specimen: Anterior Nasal Swab  Result Value Ref Range Status   SARS Coronavirus 2 by RT PCR POSITIVE (A) NEGATIVE Final    Comment: (NOTE) SARS-CoV-2 target nucleic acids are DETECTED.  The SARS-CoV-2 RNA is generally detectable in upper respiratory specimens during the acute phase of infection. Positive results are indicative of the presence of the identified virus, but do not rule out bacterial infection  or co-infection with other pathogens not detected by the test. Clinical correlation with patient history and other diagnostic information is necessary to determine patient infection status. The expected result is Negative.  Fact Sheet for Patients: bloggercourse.com  Fact Sheet for Healthcare Providers: seriousbroker.it  This test is not yet approved or cleared by the [place]  FDA and  has been authorized for detection and/or diagnosis of SARS-CoV-2 by FDA under an Emergency Use Authorization (EUA).  This EUA will remain in effect (meaning this test can be used) for the duration of  the COVID-19 declaration under Section 564(b)(1) of the A ct, 21 U.S.C. section 360bbb-3(b)(1), unless the authorization is terminated or revoked sooner.     Influenza A by PCR NEGATIVE  NEGATIVE Final   Influenza B by PCR NEGATIVE NEGATIVE Final    Comment: (NOTE) The Xpert Xpress SARS-CoV-2/FLU/RSV plus assay is intended as an aid in the diagnosis of influenza from Nasopharyngeal swab specimens and should not be used as a sole basis for treatment. Nasal washings and aspirates are unacceptable for Xpert Xpress SARS-CoV-2/FLU/RSV testing.  Fact Sheet for Patients: bloggercourse.com  Fact Sheet for Healthcare Providers: seriousbroker.it  This test is not yet approved or cleared by the [place]  FDA and has been authorized for detection and/or diagnosis of SARS-CoV-2 by FDA under an Emergency Use Authorization (EUA). This EUA will remain in effect (meaning this test can be used) for the duration of the COVID-19 declaration under Section 564(b)(1) of the Act, 21 U.S.C. section 360bbb-3(b)(1), unless the authorization is terminated or revoked.     Resp Syncytial Virus by PCR NEGATIVE NEGATIVE Final    Comment: (NOTE) Fact Sheet for Patients: bloggercourse.com  Fact Sheet for Healthcare Providers: seriousbroker.it  This test is not yet approved or cleared by the [place]  FDA and has been authorized for detection and/or diagnosis of SARS-CoV-2 by FDA under an Emergency Use Authorization (EUA). This EUA will remain in effect (meaning this test can be used) for the duration of the COVID-19 declaration under Section 564(b)(1) of the Act, 21 U.S.C. section 360bbb-3(b)(1), unless the authorization is terminated or revoked.  Performed at [facility], [address] [place], [place] [address]   Blood Culture (routine x 2)     Status: None (Preliminary result)   Collection Time: 07/13/24  6:52 PM   Specimen: BLOOD  Result Value Ref Range Status   Specimen Description BLOOD BLOOD LEFT ARM  Final   Special Requests   Final    BOTTLES DRAWN AEROBIC  AND ANAEROBIC Blood Culture results may not be optimal due to an inadequate volume of blood received in culture bottles   Culture   Final    NO GROWTH 3 DAYS Performed at [facility], [address], [place], [place] [address]    Report Status PENDING  Incomplete  Blood Culture (routine x 2)     Status: None (Preliminary result)   Collection Time: 07/13/24  6:52 PM   Specimen: BLOOD  Result Value Ref Range Status   Specimen Description BLOOD BLOOD RIGHT ARM  Final   Special Requests   Final    BOTTLES DRAWN AEROBIC AND ANAEROBIC Blood Culture adequate volume   Culture   Final    NO GROWTH 3 DAYS Performed at [facility], [address], [place], [place] [address]    Report Status PENDING  Incomplete  Acid Fast Smear (AFB)     Status: None   Collection Time: 07/14/24  4:41 AM  Specimen: Sputum  Result Value Ref Range Status   AFB Specimen Processing Concentration  Final   Acid Fast Smear Negative  Final    Comment: (NOTE) Performed At: [facility] [address] [place], [place] [id number] Jennette Shorter MD Ey:[id number]    Source (AFB) EXPECTORATED SPUTUM  Final    Comment: Performed at [facility], [address] [place], [place] [address]  Expectorated Sputum Assessment w Gram Stain, Rflx to Resp Cult     Status: None   Collection Time: 07/15/24 12:00 PM   Specimen: Sputum  Result Value Ref Range Status   Specimen Description SPUTUM  Final   Special Requests NONE  Final   Sputum evaluation   Final    THIS SPECIMEN IS ACCEPTABLE FOR SPUTUM CULTURE Performed at [facility], [address], [place], [place] [address]    Report Status 07/15/2024 FINAL  Final  Culture, Respiratory w Gram Stain     Status: None (Preliminary result)   Collection Time: 07/15/24 12:00 PM   Specimen: SPU  Result Value Ref Range Status   Specimen Description   Final    SPUTUM Performed at [facility], [address]  [place], [place] [address]    Special Requests   Final    NONE Reflexed from [id number] Performed at [facility], [address] [place], [place] [address]    Gram Stain   Final    FEW WBC PRESENT, PREDOMINANTLY PMN RARE GRAM POSITIVE COCCI RARE BUDDING YEAST SEEN    Culture   Final    TOO YOUNG TO READ Performed at [facility], [address], [place], [place] [address]    Report Status PENDING  Incomplete  MRSA Next Gen by PCR, Nasal     Status: None   Collection Time: 07/16/24  8:00 AM   Specimen: Nasal Mucosa; Nasal Swab  Result Value Ref Range Status   MRSA by PCR Next Gen NOT DETECTED NOT DETECTED Final    Comment: (NOTE) The GeneXpert MRSA Assay (FDA approved for NASAL specimens only), is one component of a comprehensive MRSA colonization surveillance program. It is not intended to diagnose MRSA infection nor to guide or monitor treatment for MRSA infections. Test performance is not FDA approved in patients less than 65 years old. Performed at [facility], [address] [place], [place] [address]    Imaging CT Angio Chest PE W and/or Wo Contrast Result Date: 07/13/2024 CLINICAL DATA:  Febrile, hypoxia, productive cough EXAM: CT ANGIOGRAPHY CHEST WITH CONTRAST TECHNIQUE: Multidetector CT imaging of the chest was performed using the standard protocol during bolus administration of intravenous contrast. Multiplanar CT image reconstructions and MIPs were obtained to evaluate the vascular anatomy. RADIATION DOSE REDUCTION: This exam was performed according to the departmental dose-optimization program which includes automated exposure control, adjustment of the mA and/or kV according to patient size and/or use of iterative reconstruction technique. CONTRAST:  75mL OMNIPAQUE  IOHEXOL  350 MG/ML SOLN COMPARISON:  07/13/2024, 05/23/2023 FINDINGS: Cardiovascular: This is a technically adequate evaluation of the pulmonary vasculature. No filling defects or pulmonary  emboli. Dilated main pulmonary artery measuring 3.6 cm consistent with pulmonary arterial hypertension. The heart is unremarkable without pericardial effusion. No evidence of thoracic aortic aneurysm or dissection. There is atherosclerosis of the thoracic aorta. There is short segment occlusion of the left subclavian artery at its origin, with normal opacification of the left subclavian artery distal to the occlusion likely reflecting retrograde flow within the vertebral  artery. Mediastinum/Nodes: Subcentimeter mediastinal and hilar lymph nodes are identified, measuring up to 9 mm in the precarinal region. Thyroid, trachea, and esophagus are unremarkable. Lungs/Pleura: Nodular airspace disease is seen diffusely throughout the bilateral lungs, with many of the small nodular areas of consolidation demonstrating central cavitation. Findings are consistent with atypical cavitating pneumonia, versus sequela of septic emboli. No effusion or pneumothorax. Central airways are patent. Upper Abdomen: No acute abnormality. Musculoskeletal: No acute or destructive bony abnormalities. Reconstructed images demonstrate no additional findings. Review of the MIP images confirms the above findings. IMPRESSION: 1. No evidence of acute pulmonary embolus. 2. Small nodular airspace opacities scattered throughout the lungs bilaterally, demonstrating areas of central cavitation. Findings are consistent with atypical cavitating pneumonia versus sequela of septic emboli. 3. Subcentimeter mediastinal and hilar lymph nodes consistent with reactive adenopathy. No pathologically enlarged lymph nodes are identified. 4. 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 5. Dilated main pulmonary artery consistent with pulmonary arterial hypertension. 6.  Aortic Atherosclerosis (ICD10-I70.0). Electronically Signed    By: Ozell Daring M.D.   On: 07/13/2024 20:09   DG Chest Port 1 View Result Date: 07/13/2024 EXAM: 1 VIEW XRAY OF THE CHEST 07/13/2024 06:45:16 PM COMPARISON: Prior study dated 05/16/2024. CLINICAL HISTORY: Questionable sepsis - evaluate for abnormality. FINDINGS: LUNGS AND PLEURA: Stable probable scarring seen in the right lung base. No pulmonary edema. No pleural effusion. No pneumothorax. HEART AND MEDIASTINUM: No acute abnormality of the cardiac and mediastinal silhouettes. BONES AND SOFT TISSUES: No acute osseous abnormality. IMPRESSION: 1. No acute cardiopulmonary abnormality. 2. Stable scarring in the right lung base. Electronically signed by: Lynwood Seip MD 07/13/2024 06:56 PM EDT RP Workstation: [id number]      Time coordinating discharge: over 30 minutes  SIGNED:  Doniesha Landau DO [facility]

## 2024-07-16 NOTE — Discharge Instructions (Addendum)
 USE THE OXYGEN ALL THE TIME AT 2-3 L/MIN UNTIL TOLD OTHERWISE BY PULMONOLOGIST OR PCP OUTPATIENT   Agency Name: [facility] Agency Address: [address], [place], [place] [address] Phone: [phone] Website: [email or web address] Service(s) Offered: Housing services, self-sufficiency, congregate meal program, and individual development account program.  Agency Name: [employer] [facility] Address: [address] [place], [place] [address] Phone: [phone] Email: [email or web address] Website: [email or web address] Service(s) Offered: Housing the homeless, feeding the hungry, company secretary, job and education related services.  Agency Name: [facility] Address: [address] [place], [place] [address] Phone: [phone] Email: [email or web address] Service(s) Offered: Counseling, problem pregnancy, advocacy for Hispanics, limited emergency financial assistance.  Agency Name: [employer] Address: [address] [place], [place] [address] Phone: [phone] Website: [email or web address] Service(s) Offered: Child support services; child welfare services; SNAP; Medicaid; work first family assistance; and aid with fuel,  rent, food and medicine.  Agency Name: [employer] Address: [address], [place], [place] [address] Phone: [phone] or [phone] Email: [email or web address] Service(s) Offered: Family services and transient assistance; emergency food, fuel, clothing, limited furniture, utilities; budget counseling, general counseling; give a kid a coat; thrift store; Christmas food and toys. Utility assistance, food pantry, rental  assistance, life sustaining medicine  Do you feel isolated?  [facility] [employer] [facility] offers a [employer] that anyone can call toll free at [phone]. The friendship line is available 24 hours a day  [employer] is a Program of All-inclusive Care for the Elderly (PACE). Their mission is to promote and sustain the independence of seniors wishing to remain in the community. They provide seniors with comprehensive long-term health, social, medical and dietary care. Their program is a safe alternative to nursing home care. [phone]  [facility] Physical Address [facility] [address] [place], [place] [address] Phone: [phone]. . Online zoom yoga class, connect with others without leaving your home [facility] offers [place] dance cardio sessions for individuals via Zoom. This program provides: - Dance fitness activities Please contact program for more information. Servinganyone in need adults 18+ hiv/aids individuals families Call [phone]  Email [email or web address] [email or web address] to get more info  [employer] offers an online [employer] to individuals where they can receive help to focus on their best health. Whether you're a [employer] member or not, the neighborhood center offers a... Main Serviceshealth education  exercise & fitness  community support services  recreation  virtual support Other Servicessupport groups Servinganyone in need adults young adults teens seniors individuals families [email or web address] to get more info  Schedule on their website  [facility] offers an array of activities for adults age 30 and over. This program provides:- Fitness and health programs- Tech classes- Activity books Main Serviceshealth education  community support services  exercise & fitness  recreation  more education Servingseniors  Call [phone]    For more resources go online to [email or web address].[email or web address].uk and type in you zipcode

## 2024-07-17 LAB — FUNGITELL BETA-D-GLUCAN: Fungitell Value:: <31.25 pg/mL

## 2024-07-18 ENCOUNTER — Telehealth: Payer: Self-pay

## 2024-07-18 DIAGNOSIS — J449 Chronic obstructive pulmonary disease, unspecified: Secondary | ICD-10-CM | POA: Diagnosis not present

## 2024-07-18 DIAGNOSIS — J441 Chronic obstructive pulmonary disease with (acute) exacerbation: Secondary | ICD-10-CM | POA: Diagnosis not present

## 2024-07-18 LAB — CULTURE, BLOOD (ROUTINE X 2)
Culture: NO GROWTH
Culture: NO GROWTH
Special Requests: ADEQUATE

## 2024-07-18 LAB — CULTURE, RESPIRATORY W GRAM STAIN: Culture: NORMAL

## 2024-07-18 LAB — LEGIONELLA PNEUMOPHILA SEROGP 1 UR AG: L. pneumophila Serogp 1 Ur Ag: NEGATIVE

## 2024-07-18 NOTE — Transitions of Care (Post Inpatient/ED Visit) (Signed)
   07/18/2024  Name: Roy Richards. MRN: [id number] DOB: 1961/04/14  Today's TOC FU Call Status: Today's TOC FU Call Status:: Unsuccessful Call (1st Attempt) Unsuccessful Call (1st Attempt) Date: 07/18/24  Attempted to reach the patient regarding the most recent Inpatient/ED visit.  Follow Up Plan: Additional outreach attempts will be made to reach the patient to complete the Transitions of Care (Post Inpatient/ED visit) call.   Arvin Seip RN, BSN, CCM [employer], Population Health Case Manager Phone: [phone]

## 2024-07-19 ENCOUNTER — Telehealth: Payer: Self-pay

## 2024-07-19 NOTE — Transitions of Care (Post Inpatient/ED Visit) (Signed)
   07/19/2024  Name: Roy Richards. MRN: [id number] DOB: 07-28-1961  Today's TOC FU Call Status: Today's TOC FU Call Status:: Unsuccessful Call (2nd Attempt) Unsuccessful Call (2nd Attempt) Date: 07/19/24  Attempted to reach the patient regarding the most recent Inpatient/ED visit.  Follow Up Plan: Additional outreach attempts will be made to reach the patient to complete the Transitions of Care (Post Inpatient/ED visit) call.   Arvin Seip RN, BSN, CCM [employer], Population Health Case Manager Phone: [phone]

## 2024-07-20 ENCOUNTER — Telehealth: Payer: Self-pay

## 2024-07-20 NOTE — Transitions of Care (Post Inpatient/ED Visit) (Signed)
   07/20/2024  Name: Roy Richards. MRN: [id number] DOB: 08/15/1961  Today's TOC FU Call Status: Today's TOC FU Call Status:: Unsuccessful Call (3rd Attempt) Unsuccessful Call (1st Attempt) Date: 07/18/24 Unsuccessful Call (2nd Attempt) Date: 07/19/24 Unsuccessful Call (3rd Attempt) Date: 07/20/24  Attempted to reach the patient regarding the most recent Inpatient/ED visit.  Follow Up Plan: No further outreach attempts will be made at this time. We have been unable to contact the patient.  Medford Balboa, BSN, RN [employer]  [facility] - [employer] Health RN Care Manager [phone]

## 2024-07-31 ENCOUNTER — Telehealth: Payer: Self-pay | Admitting: Primary Care

## 2024-07-31 NOTE — Telephone Encounter (Signed)
 Placed form in Kate's in-box in her office.

## 2024-07-31 NOTE — Telephone Encounter (Signed)
 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

## 2024-08-01 NOTE — Telephone Encounter (Signed)
Completed and placed in kelli's inbox.

## 2024-08-02 NOTE — Telephone Encounter (Signed)
 Called patient let know that form at reception for pick up. Copy sent to scan in chart.

## 2024-08-03 ENCOUNTER — Other Ambulatory Visit: Payer: Self-pay | Admitting: Primary Care

## 2024-08-03 DIAGNOSIS — E1159 Type 2 diabetes mellitus with other circulatory complications: Secondary | ICD-10-CM

## 2024-08-17 DIAGNOSIS — J441 Chronic obstructive pulmonary disease with (acute) exacerbation: Secondary | ICD-10-CM | POA: Diagnosis not present

## 2024-08-17 DIAGNOSIS — J449 Chronic obstructive pulmonary disease, unspecified: Secondary | ICD-10-CM | POA: Diagnosis not present

## 2024-08-24 ENCOUNTER — Other Ambulatory Visit: Payer: Self-pay | Admitting: Obstetrics and Gynecology

## 2024-08-24 DIAGNOSIS — J449 Chronic obstructive pulmonary disease, unspecified: Secondary | ICD-10-CM

## 2024-08-27 DIAGNOSIS — J449 Chronic obstructive pulmonary disease, unspecified: Secondary | ICD-10-CM

## 2024-08-27 LAB — ACID FAST CULTURE WITH REFLEXED SENSITIVITIES (MYCOBACTERIA): Acid Fast Culture: NEGATIVE

## 2024-08-28 ENCOUNTER — Telehealth: Payer: Self-pay | Admitting: Primary Care

## 2024-08-28 MED ORDER — ALBUTEROL SULFATE HFA 108 (90 BASE) MCG/ACT IN AERS: 1.0000 | INHALATION_SPRAY | Freq: Four times a day (QID) | RESPIRATORY_TRACT | 0 refills | Status: DC | PRN

## 2024-08-28 NOTE — Telephone Encounter (Signed)
 Placed form in Kate's in-box in her office.

## 2024-08-28 NOTE — Telephone Encounter (Signed)
 Patient dropped off document Insurance Form, to be filled out by provider. Patient requested to send it back via Call Patient to pick up within 5-days. Document is located in providers tray at front office.Please advise at Mobile [phone] (mobile)

## 2024-08-29 NOTE — Telephone Encounter (Signed)
 Completed and placed in the inbox.

## 2024-08-30 NOTE — Telephone Encounter (Signed)
 Faxed completed form to [facility] at [phone].  [Mailed original form to pt. Made copy to scan.]

## 2024-09-25 ENCOUNTER — Other Ambulatory Visit: Payer: Self-pay | Admitting: Primary Care

## 2024-09-25 DIAGNOSIS — I10 Essential (primary) hypertension: Secondary | ICD-10-CM

## 2024-09-25 MED ORDER — AMLODIPINE BESYLATE 10 MG PO TABS: 10.0000 mg | ORAL_TABLET | Freq: Every day | ORAL | 1 refills | Status: AC

## 2024-09-25 NOTE — Telephone Encounter (Signed)
 Spoke with Roy Richards relaying Kate's message and asking if he takes amlodipine . Roy Richards confirms he restarted amlodipine  after hospital d/c. However, Roy Richards no longer uses [employer] since [employer]. Now uses [facility] and requests rx be sent there. Says Arloa Prior can be taken out of chart.  Updated Roy Richards's pharmacy list.

## 2024-09-25 NOTE — Telephone Encounter (Signed)
 Please call patient:  Received refill request for amlodipine  BP pill from pharmacy. Looks like his amlodipine  was discontinued during his hospital stay. Has he been taking amlodipine ?

## 2024-09-25 NOTE — Telephone Encounter (Signed)
Noted. Refill(s) sent to pharmacy.  

## 2024-09-26 ENCOUNTER — Telehealth: Payer: Self-pay | Admitting: Cardiovascular Disease

## 2024-09-26 DIAGNOSIS — F32A Depression, unspecified: Secondary | ICD-10-CM

## 2024-09-26 DIAGNOSIS — E785 Hyperlipidemia, unspecified: Secondary | ICD-10-CM

## 2024-09-26 DIAGNOSIS — J449 Chronic obstructive pulmonary disease, unspecified: Secondary | ICD-10-CM

## 2024-09-26 DIAGNOSIS — E1159 Type 2 diabetes mellitus with other circulatory complications: Secondary | ICD-10-CM

## 2024-09-26 DIAGNOSIS — I10 Essential (primary) hypertension: Secondary | ICD-10-CM

## 2024-09-26 NOTE — Telephone Encounter (Signed)
 Patient was returning call for appt but there is no order. Please advise

## 2024-09-26 NOTE — Telephone Encounter (Signed)
 Returned pt's call; identity of pt verified using 2 identifiers: pt states that he received a call regarding an appointment, but he states he doesn't have an appointment with cardiology; I explained that it looked as if it was time for his annual cardiology appointment with Dr. Gollan.  Pt states that he is not having any heart issues and feels he doesn't need an appointment at this time.  He is receiving a carotid study with [facility], but will be followed by Orvin Daring.  He stated that he would discuss with his PCP, Comer Gaskins, NP to see if he needed to continue to see cardiology.  Pt thanked me for returning his call.

## 2024-10-05 MED ORDER — ALBUTEROL SULFATE HFA 108 (90 BASE) MCG/ACT IN AERS: 1.0000 | INHALATION_SPRAY | Freq: Four times a day (QID) | RESPIRATORY_TRACT | 0 refills | Status: AC | PRN

## 2024-10-05 MED ORDER — ATORVASTATIN CALCIUM 40 MG PO TABS: 40.0000 mg | ORAL_TABLET | Freq: Every day | ORAL | 1 refills | Status: AC

## 2024-10-05 MED ORDER — METFORMIN HCL ER 500 MG PO TB24: 500.0000 mg | ORAL_TABLET | Freq: Every day | ORAL | 1 refills | Status: AC

## 2024-10-05 MED ORDER — LOSARTAN POTASSIUM 50 MG PO TABS: 50.0000 mg | ORAL_TABLET | Freq: Every day | ORAL | 1 refills | Status: AC

## 2024-10-05 MED ORDER — CITALOPRAM HYDROBROMIDE 40 MG PO TABS: 40.0000 mg | ORAL_TABLET | Freq: Every day | ORAL | 1 refills | Status: AC

## 2024-10-05 MED ORDER — FLUTICASONE-SALMETEROL 250-50 MCG/ACT IN AEPB: 1.0000 | INHALATION_SPRAY | Freq: Two times a day (BID) | RESPIRATORY_TRACT | 1 refills | Status: AC

## 2024-11-06 ENCOUNTER — Encounter (INDEPENDENT_AMBULATORY_CARE_PROVIDER_SITE_OTHER): Payer: Medicare HMO

## 2024-11-06 ENCOUNTER — Ambulatory Visit (INDEPENDENT_AMBULATORY_CARE_PROVIDER_SITE_OTHER): Payer: Medicare HMO | Admitting: Nurse Practitioner

## 2024-11-26 ENCOUNTER — Ambulatory Visit: Payer: Self-pay | Admitting: Cardiovascular Disease

## 2024-12-20 ENCOUNTER — Encounter: Admitting: Primary Care

## 2025-02-05 ENCOUNTER — Ambulatory Visit
